# Patient Record
Sex: Male | Born: 1940 | Race: White | Hispanic: No | Marital: Married | Smoking: Former smoker
Health system: Southern US, Community
[De-identification: ages and names within clinical notes are randomized; demographics above are authoritative.]

## PROBLEM LIST (undated history)

## (undated) DIAGNOSIS — Z860100 Personal history of colon polyps, unspecified: Secondary | ICD-10-CM

## (undated) DIAGNOSIS — C4492 Squamous cell carcinoma of skin, unspecified: Secondary | ICD-10-CM

## (undated) DIAGNOSIS — I82409 Acute embolism and thrombosis of unspecified deep veins of unspecified lower extremity: Secondary | ICD-10-CM

## (undated) DIAGNOSIS — N529 Male erectile dysfunction, unspecified: Secondary | ICD-10-CM

## (undated) DIAGNOSIS — M720 Palmar fascial fibromatosis [Dupuytren]: Secondary | ICD-10-CM

## (undated) DIAGNOSIS — I10 Essential (primary) hypertension: Secondary | ICD-10-CM

## (undated) DIAGNOSIS — I219 Acute myocardial infarction, unspecified: Secondary | ICD-10-CM

## (undated) DIAGNOSIS — A809 Acute poliomyelitis, unspecified: Secondary | ICD-10-CM

## (undated) DIAGNOSIS — N361 Urethral diverticulum: Secondary | ICD-10-CM

## (undated) DIAGNOSIS — I8390 Asymptomatic varicose veins of unspecified lower extremity: Secondary | ICD-10-CM

## (undated) DIAGNOSIS — E119 Type 2 diabetes mellitus without complications: Secondary | ICD-10-CM

## (undated) DIAGNOSIS — C259 Malignant neoplasm of pancreas, unspecified: Secondary | ICD-10-CM

## (undated) DIAGNOSIS — N200 Calculus of kidney: Secondary | ICD-10-CM

## (undated) DIAGNOSIS — S7290XA Unspecified fracture of unspecified femur, initial encounter for closed fracture: Secondary | ICD-10-CM

## (undated) DIAGNOSIS — K5730 Diverticulosis of large intestine without perforation or abscess without bleeding: Secondary | ICD-10-CM

## (undated) DIAGNOSIS — I2510 Atherosclerotic heart disease of native coronary artery without angina pectoris: Secondary | ICD-10-CM

## (undated) DIAGNOSIS — K219 Gastro-esophageal reflux disease without esophagitis: Secondary | ICD-10-CM

## (undated) DIAGNOSIS — E785 Hyperlipidemia, unspecified: Secondary | ICD-10-CM

## (undated) HISTORY — DX: Acute embolism and thrombosis of unspecified deep veins of unspecified lower extremity: I82.409

## (undated) HISTORY — DX: Gastro-esophageal reflux disease without esophagitis: K21.9

## (undated) HISTORY — DX: Atherosclerotic heart disease of native coronary artery without angina pectoris: I25.10

## (undated) HISTORY — DX: Acute myocardial infarction, unspecified: I21.9

## (undated) HISTORY — DX: Squamous cell carcinoma of skin, unspecified: C44.92

## (undated) HISTORY — DX: Diverticulosis of large intestine without perforation or abscess without bleeding: K57.30

## (undated) HISTORY — DX: Type 2 diabetes mellitus without complications: E11.9

## (undated) HISTORY — DX: Essential (primary) hypertension: I10

## (undated) HISTORY — DX: Urethral diverticulum: N36.1

## (undated) HISTORY — DX: Calculus of kidney: N20.0

## (undated) HISTORY — DX: Palmar fascial fibromatosis (dupuytren): M72.0

## (undated) HISTORY — DX: Male erectile dysfunction, unspecified: N52.9

## (undated) HISTORY — DX: Asymptomatic varicose veins of unspecified lower extremity: I83.90

## (undated) HISTORY — DX: Hyperlipidemia, unspecified: E78.5

## (undated) HISTORY — DX: Unspecified fracture of unspecified femur, initial encounter for closed fracture: S72.90XA

## (undated) HISTORY — DX: Malignant neoplasm of pancreas, unspecified: C25.9

## (undated) HISTORY — DX: Personal history of colonic polyps: Z86.010

## (undated) HISTORY — DX: Personal history of colon polyps, unspecified: Z86.0100

## (undated) HISTORY — DX: Acute poliomyelitis, unspecified: A80.9

---
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
## 1992-10-08 HISTORY — PX: APPENDECTOMY: SHX54

## 1999-07-13 ENCOUNTER — Emergency Department (HOSPITAL_COMMUNITY): Admission: EM | Admit: 1999-07-13 | Discharge: 1999-07-13 | Payer: Self-pay | Admitting: Emergency Medicine

## 1999-07-13 ENCOUNTER — Encounter: Payer: Self-pay | Admitting: Emergency Medicine

## 2001-01-29 ENCOUNTER — Encounter: Payer: Self-pay | Admitting: Family Medicine

## 2003-02-06 ENCOUNTER — Encounter: Payer: Self-pay | Admitting: Family Medicine

## 2003-02-06 LAB — CONVERTED CEMR LAB: PSA: 1 ng/mL

## 2003-02-18 ENCOUNTER — Encounter: Payer: Self-pay | Admitting: Family Medicine

## 2003-02-18 LAB — CONVERTED CEMR LAB
Blood Glucose, Fasting: 83 mg/dL
PSA: 1 ng/mL

## 2004-08-10 ENCOUNTER — Ambulatory Visit: Payer: Self-pay | Admitting: Family Medicine

## 2004-12-05 ENCOUNTER — Ambulatory Visit: Payer: Self-pay | Admitting: Family Medicine

## 2004-12-06 ENCOUNTER — Encounter: Payer: Self-pay | Admitting: Family Medicine

## 2004-12-06 LAB — CONVERTED CEMR LAB: PSA: 0.88 ng/mL

## 2004-12-26 ENCOUNTER — Ambulatory Visit: Payer: Self-pay | Admitting: Family Medicine

## 2004-12-26 LAB — CONVERTED CEMR LAB
Blood Glucose, Fasting: 112 mg/dL
PSA: 0.88 ng/mL
TSH: 2.64 microintl units/mL

## 2004-12-28 ENCOUNTER — Ambulatory Visit: Payer: Self-pay | Admitting: Family Medicine

## 2005-01-02 ENCOUNTER — Ambulatory Visit: Payer: Self-pay | Admitting: Family Medicine

## 2005-01-09 ENCOUNTER — Ambulatory Visit: Payer: Self-pay

## 2005-01-15 ENCOUNTER — Ambulatory Visit: Payer: Self-pay | Admitting: Family Medicine

## 2005-03-14 ENCOUNTER — Ambulatory Visit (HOSPITAL_BASED_OUTPATIENT_CLINIC_OR_DEPARTMENT_OTHER): Admission: RE | Admit: 2005-03-14 | Discharge: 2005-03-14 | Payer: Self-pay | Admitting: Surgery

## 2005-03-14 ENCOUNTER — Ambulatory Visit (HOSPITAL_COMMUNITY): Admission: RE | Admit: 2005-03-14 | Discharge: 2005-03-14 | Payer: Self-pay | Admitting: Surgery

## 2005-03-14 HISTORY — PX: UMBILICAL HERNIA REPAIR: SHX196

## 2005-07-02 ENCOUNTER — Ambulatory Visit: Payer: Self-pay | Admitting: Family Medicine

## 2005-08-06 ENCOUNTER — Ambulatory Visit: Payer: Self-pay | Admitting: Family Medicine

## 2005-09-07 ENCOUNTER — Ambulatory Visit: Payer: Self-pay | Admitting: Family Medicine

## 2005-09-10 ENCOUNTER — Ambulatory Visit: Payer: Self-pay | Admitting: Family Medicine

## 2005-10-29 ENCOUNTER — Ambulatory Visit: Payer: Self-pay | Admitting: Family Medicine

## 2006-04-23 ENCOUNTER — Ambulatory Visit: Payer: Self-pay | Admitting: Family Medicine

## 2006-04-23 LAB — CONVERTED CEMR LAB: Blood Glucose, Fasting: 115 mg/dL

## 2006-04-26 ENCOUNTER — Ambulatory Visit: Payer: Self-pay | Admitting: Family Medicine

## 2006-06-03 ENCOUNTER — Ambulatory Visit: Payer: Self-pay | Admitting: Family Medicine

## 2006-06-05 ENCOUNTER — Ambulatory Visit: Payer: Self-pay | Admitting: Family Medicine

## 2006-08-14 ENCOUNTER — Ambulatory Visit: Payer: Self-pay | Admitting: Family Medicine

## 2006-09-03 ENCOUNTER — Ambulatory Visit: Payer: Self-pay | Admitting: Family Medicine

## 2006-09-05 ENCOUNTER — Ambulatory Visit: Payer: Self-pay | Admitting: Family Medicine

## 2007-01-09 ENCOUNTER — Ambulatory Visit: Payer: Self-pay | Admitting: Family Medicine

## 2007-03-19 ENCOUNTER — Telehealth (INDEPENDENT_AMBULATORY_CARE_PROVIDER_SITE_OTHER): Payer: Self-pay | Admitting: *Deleted

## 2007-04-02 ENCOUNTER — Ambulatory Visit: Payer: Self-pay | Admitting: Family Medicine

## 2007-04-02 DIAGNOSIS — T887XXA Unspecified adverse effect of drug or medicament, initial encounter: Secondary | ICD-10-CM | POA: Insufficient documentation

## 2007-04-03 ENCOUNTER — Telehealth (INDEPENDENT_AMBULATORY_CARE_PROVIDER_SITE_OTHER): Payer: Self-pay | Admitting: *Deleted

## 2007-04-09 ENCOUNTER — Telehealth (INDEPENDENT_AMBULATORY_CARE_PROVIDER_SITE_OTHER): Payer: Self-pay | Admitting: *Deleted

## 2007-04-10 ENCOUNTER — Ambulatory Visit: Payer: Self-pay | Admitting: Family Medicine

## 2007-04-15 ENCOUNTER — Encounter: Payer: Self-pay | Admitting: Family Medicine

## 2007-05-21 ENCOUNTER — Encounter: Payer: Self-pay | Admitting: Family Medicine

## 2007-05-21 DIAGNOSIS — Z860100 Personal history of colon polyps, unspecified: Secondary | ICD-10-CM | POA: Insufficient documentation

## 2007-05-21 DIAGNOSIS — E785 Hyperlipidemia, unspecified: Secondary | ICD-10-CM | POA: Insufficient documentation

## 2007-05-21 DIAGNOSIS — M720 Palmar fascial fibromatosis [Dupuytren]: Secondary | ICD-10-CM | POA: Insufficient documentation

## 2007-05-21 DIAGNOSIS — K5730 Diverticulosis of large intestine without perforation or abscess without bleeding: Secondary | ICD-10-CM | POA: Insufficient documentation

## 2007-05-21 DIAGNOSIS — I10 Essential (primary) hypertension: Secondary | ICD-10-CM

## 2007-05-21 DIAGNOSIS — K219 Gastro-esophageal reflux disease without esophagitis: Secondary | ICD-10-CM

## 2007-05-21 DIAGNOSIS — F528 Other sexual dysfunction not due to a substance or known physiological condition: Secondary | ICD-10-CM

## 2007-05-21 DIAGNOSIS — J301 Allergic rhinitis due to pollen: Secondary | ICD-10-CM

## 2007-05-27 ENCOUNTER — Ambulatory Visit: Payer: Self-pay | Admitting: Unknown Physician Specialty

## 2007-05-27 ENCOUNTER — Ambulatory Visit: Payer: Self-pay | Admitting: Family Medicine

## 2007-05-27 ENCOUNTER — Other Ambulatory Visit: Payer: Self-pay

## 2007-05-29 ENCOUNTER — Ambulatory Visit: Payer: Self-pay | Admitting: Family Medicine

## 2007-06-02 ENCOUNTER — Ambulatory Visit: Payer: Self-pay | Admitting: Unknown Physician Specialty

## 2007-06-18 ENCOUNTER — Encounter: Payer: Self-pay | Admitting: Family Medicine

## 2007-06-18 HISTORY — PX: MOHS SURGERY: SUR867

## 2007-06-30 ENCOUNTER — Telehealth: Payer: Self-pay | Admitting: Family Medicine

## 2007-06-30 ENCOUNTER — Ambulatory Visit: Payer: Self-pay | Admitting: Internal Medicine

## 2007-06-30 ENCOUNTER — Ambulatory Visit: Payer: Self-pay | Admitting: Family Medicine

## 2007-06-30 DIAGNOSIS — R319 Hematuria, unspecified: Secondary | ICD-10-CM | POA: Insufficient documentation

## 2007-06-30 LAB — CONVERTED CEMR LAB
Bilirubin Urine: NEGATIVE
Calcium: 9.4 mg/dL (ref 8.4–10.5)
Casts: 0 /lpf
Chloride: 105 meq/L (ref 96–112)
GFR calc Af Amer: 65 mL/min
GFR calc non Af Amer: 54 mL/min
Mucus, UA: 0
Sodium: 141 meq/L (ref 135–145)
Specific Gravity, Urine: 1.02
Urobilinogen, UA: 0.2
Yeast, UA: 0

## 2007-07-01 ENCOUNTER — Encounter: Payer: Self-pay | Admitting: Family Medicine

## 2007-07-07 ENCOUNTER — Ambulatory Visit (HOSPITAL_COMMUNITY): Admission: RE | Admit: 2007-07-07 | Discharge: 2007-07-07 | Payer: Self-pay | Admitting: Urology

## 2007-08-18 ENCOUNTER — Ambulatory Visit: Payer: Self-pay | Admitting: Family Medicine

## 2007-10-07 ENCOUNTER — Telehealth (INDEPENDENT_AMBULATORY_CARE_PROVIDER_SITE_OTHER): Payer: Self-pay | Admitting: *Deleted

## 2007-10-08 ENCOUNTER — Ambulatory Visit: Payer: Self-pay | Admitting: Family Medicine

## 2007-10-09 DIAGNOSIS — I82409 Acute embolism and thrombosis of unspecified deep veins of unspecified lower extremity: Secondary | ICD-10-CM

## 2007-10-09 HISTORY — DX: Acute embolism and thrombosis of unspecified deep veins of unspecified lower extremity: I82.409

## 2007-10-13 ENCOUNTER — Ambulatory Visit: Payer: Self-pay | Admitting: Family Medicine

## 2007-10-13 ENCOUNTER — Other Ambulatory Visit: Payer: Self-pay

## 2007-10-13 ENCOUNTER — Telehealth: Payer: Self-pay | Admitting: Family Medicine

## 2007-10-13 ENCOUNTER — Inpatient Hospital Stay: Payer: Self-pay | Admitting: *Deleted

## 2007-10-13 DIAGNOSIS — M79609 Pain in unspecified limb: Secondary | ICD-10-CM | POA: Insufficient documentation

## 2007-10-14 ENCOUNTER — Encounter: Payer: Self-pay | Admitting: Family Medicine

## 2007-10-15 ENCOUNTER — Encounter: Payer: Self-pay | Admitting: Family Medicine

## 2007-10-17 ENCOUNTER — Ambulatory Visit: Payer: Self-pay | Admitting: Family Medicine

## 2007-10-17 DIAGNOSIS — I82409 Acute embolism and thrombosis of unspecified deep veins of unspecified lower extremity: Secondary | ICD-10-CM | POA: Insufficient documentation

## 2007-10-17 LAB — CONVERTED CEMR LAB: INR: 1.6

## 2007-10-20 ENCOUNTER — Ambulatory Visit: Payer: Self-pay | Admitting: Family Medicine

## 2007-10-20 LAB — CONVERTED CEMR LAB: INR: 3.1

## 2007-10-22 ENCOUNTER — Ambulatory Visit: Payer: Self-pay | Admitting: Family Medicine

## 2007-10-24 ENCOUNTER — Ambulatory Visit: Payer: Self-pay | Admitting: Family Medicine

## 2007-10-24 LAB — CONVERTED CEMR LAB
INR: 3.4
Prothrombin Time: 22.3 s

## 2007-10-31 ENCOUNTER — Ambulatory Visit: Payer: Self-pay | Admitting: Family Medicine

## 2007-10-31 LAB — CONVERTED CEMR LAB: INR: 3.5

## 2007-11-10 ENCOUNTER — Ambulatory Visit: Payer: Self-pay | Admitting: Family Medicine

## 2007-11-12 ENCOUNTER — Ambulatory Visit: Payer: Self-pay | Admitting: Family Medicine

## 2007-11-28 ENCOUNTER — Ambulatory Visit: Payer: Self-pay | Admitting: Family Medicine

## 2007-11-28 LAB — CONVERTED CEMR LAB: INR: 1.7

## 2007-12-12 ENCOUNTER — Ambulatory Visit: Payer: Self-pay | Admitting: Family Medicine

## 2007-12-12 LAB — CONVERTED CEMR LAB: INR: 1.8

## 2007-12-26 ENCOUNTER — Telehealth: Payer: Self-pay | Admitting: Family Medicine

## 2007-12-26 ENCOUNTER — Ambulatory Visit: Payer: Self-pay | Admitting: Family Medicine

## 2007-12-26 LAB — CONVERTED CEMR LAB
INR: 3
Prothrombin Time: 20.8 s

## 2008-01-01 ENCOUNTER — Encounter: Payer: Self-pay | Admitting: Family Medicine

## 2008-01-01 ENCOUNTER — Telehealth: Payer: Self-pay | Admitting: Family Medicine

## 2008-01-05 ENCOUNTER — Ambulatory Visit: Payer: Self-pay | Admitting: Family Medicine

## 2008-01-07 ENCOUNTER — Encounter: Payer: Self-pay | Admitting: Family Medicine

## 2008-02-05 ENCOUNTER — Telehealth: Payer: Self-pay | Admitting: Family Medicine

## 2008-02-05 ENCOUNTER — Ambulatory Visit: Payer: Self-pay | Admitting: Family Medicine

## 2008-02-05 LAB — CONVERTED CEMR LAB: INR: 2.5

## 2008-03-02 ENCOUNTER — Ambulatory Visit: Payer: Self-pay | Admitting: Family Medicine

## 2008-03-02 DIAGNOSIS — S139XXA Sprain of joints and ligaments of unspecified parts of neck, initial encounter: Secondary | ICD-10-CM

## 2008-03-02 DIAGNOSIS — I83893 Varicose veins of bilateral lower extremities with other complications: Secondary | ICD-10-CM | POA: Insufficient documentation

## 2008-03-30 ENCOUNTER — Ambulatory Visit: Payer: Self-pay | Admitting: Family Medicine

## 2008-03-30 LAB — CONVERTED CEMR LAB: Prothrombin Time: 18.4 s

## 2008-04-26 ENCOUNTER — Ambulatory Visit: Payer: Self-pay | Admitting: Family Medicine

## 2008-04-26 LAB — CONVERTED CEMR LAB: Prothrombin Time: 18.7 s

## 2008-05-10 ENCOUNTER — Encounter: Payer: Self-pay | Admitting: Family Medicine

## 2008-05-24 ENCOUNTER — Ambulatory Visit: Payer: Self-pay | Admitting: Family Medicine

## 2008-05-28 ENCOUNTER — Ambulatory Visit: Payer: Self-pay | Admitting: Family Medicine

## 2008-05-28 DIAGNOSIS — N361 Urethral diverticulum: Secondary | ICD-10-CM | POA: Insufficient documentation

## 2008-05-28 LAB — CONVERTED CEMR LAB
INR: 2.3
Prothrombin Time: 18.4 s

## 2008-05-29 LAB — CONVERTED CEMR LAB
Albumin: 3.6 g/dL (ref 3.5–5.2)
BUN: 16 mg/dL (ref 6–23)
Basophils Relative: 0.7 % (ref 0.0–3.0)
Creatinine, Ser: 0.9 mg/dL (ref 0.4–1.5)
Creatinine,U: 150.2 mg/dL
Eosinophils Absolute: 0.2 10*3/uL (ref 0.0–0.7)
Eosinophils Relative: 3.2 % (ref 0.0–5.0)
GFR calc Af Amer: 109 mL/min
GFR calc non Af Amer: 90 mL/min
HCT: 45.2 % (ref 39.0–52.0)
HDL: 34.3 mg/dL — ABNORMAL LOW (ref >39.0)
Hemoglobin: 15.2 g/dL (ref 13.0–17.0)
MCV: 80.3 fL (ref 78.0–100.0)
Microalb Creat Ratio: 3.3 mg/g (ref 0.0–30.0)
Monocytes Absolute: 0.5 10*3/uL (ref 0.1–1.0)
Neutro Abs: 3.1 10*3/uL (ref 1.4–7.7)
PSA: 1.05 ng/mL (ref 0.10–4.00)
RBC: 5.63 M/uL (ref 4.22–5.81)
Total Protein: 6.5 g/dL (ref 6.0–8.3)
WBC: 5.1 10*3/uL (ref 4.5–10.5)

## 2008-05-31 ENCOUNTER — Encounter: Payer: Self-pay | Admitting: Family Medicine

## 2008-05-31 ENCOUNTER — Ambulatory Visit: Payer: Self-pay | Admitting: Family Medicine

## 2008-06-02 ENCOUNTER — Ambulatory Visit: Payer: Self-pay | Admitting: Family Medicine

## 2008-06-15 ENCOUNTER — Telehealth: Payer: Self-pay | Admitting: Family Medicine

## 2008-06-16 ENCOUNTER — Encounter: Payer: Self-pay | Admitting: Family Medicine

## 2008-06-30 ENCOUNTER — Ambulatory Visit: Payer: Self-pay | Admitting: Family Medicine

## 2008-07-05 ENCOUNTER — Encounter: Payer: Self-pay | Admitting: Family Medicine

## 2008-07-07 ENCOUNTER — Ambulatory Visit: Payer: Self-pay | Admitting: Family Medicine

## 2008-07-12 ENCOUNTER — Encounter: Payer: Self-pay | Admitting: Family Medicine

## 2008-07-14 ENCOUNTER — Ambulatory Visit: Payer: Self-pay | Admitting: Unknown Physician Specialty

## 2008-07-14 ENCOUNTER — Encounter: Payer: Self-pay | Admitting: Family Medicine

## 2008-08-03 ENCOUNTER — Ambulatory Visit: Payer: Self-pay | Admitting: Family Medicine

## 2008-08-03 LAB — CONVERTED CEMR LAB
Bilirubin Urine: NEGATIVE
Glucose, Urine, Semiquant: NEGATIVE
INR: 2.3
Nitrite: NEGATIVE
Specific Gravity, Urine: 1.02
WBC Urine, dipstick: NEGATIVE
pH: 6

## 2008-08-05 ENCOUNTER — Telehealth: Payer: Self-pay | Admitting: Family Medicine

## 2008-08-12 ENCOUNTER — Ambulatory Visit: Payer: Self-pay | Admitting: Family Medicine

## 2008-08-23 ENCOUNTER — Ambulatory Visit: Payer: Self-pay | Admitting: Family Medicine

## 2008-08-23 DIAGNOSIS — E669 Obesity, unspecified: Secondary | ICD-10-CM

## 2008-08-23 LAB — CONVERTED CEMR LAB
INR: 4.6 — ABNORMAL HIGH (ref 0.8–1.0)
Prothrombin Time: 45.6 s — ABNORMAL HIGH (ref 10.9–13.3)

## 2008-08-30 ENCOUNTER — Ambulatory Visit: Payer: Self-pay | Admitting: Family Medicine

## 2008-09-15 ENCOUNTER — Ambulatory Visit: Payer: Self-pay | Admitting: Family Medicine

## 2008-09-15 LAB — CONVERTED CEMR LAB: Prothrombin Time: 19 s

## 2008-10-06 ENCOUNTER — Ambulatory Visit: Payer: Self-pay | Admitting: Family Medicine

## 2008-11-04 ENCOUNTER — Ambulatory Visit: Payer: Self-pay | Admitting: Family Medicine

## 2008-11-04 LAB — CONVERTED CEMR LAB: INR: 2

## 2008-12-02 ENCOUNTER — Ambulatory Visit: Payer: Self-pay | Admitting: Family Medicine

## 2008-12-02 LAB — CONVERTED CEMR LAB: Prothrombin Time: 19.9 s

## 2008-12-15 ENCOUNTER — Ambulatory Visit: Payer: Self-pay | Admitting: Family Medicine

## 2008-12-15 DIAGNOSIS — R079 Chest pain, unspecified: Secondary | ICD-10-CM | POA: Insufficient documentation

## 2009-01-03 ENCOUNTER — Ambulatory Visit: Payer: Self-pay | Admitting: Family Medicine

## 2009-01-03 LAB — CONVERTED CEMR LAB
INR: 1.7
Prothrombin Time: 16.1 s

## 2009-01-17 ENCOUNTER — Ambulatory Visit: Payer: Self-pay | Admitting: Family Medicine

## 2009-01-17 LAB — CONVERTED CEMR LAB: Prothrombin Time: 17.1 s

## 2009-01-21 ENCOUNTER — Telehealth: Payer: Self-pay | Admitting: Family Medicine

## 2009-01-21 ENCOUNTER — Encounter: Payer: Self-pay | Admitting: Family Medicine

## 2009-01-24 ENCOUNTER — Encounter: Payer: Self-pay | Admitting: Family Medicine

## 2009-01-26 ENCOUNTER — Telehealth: Payer: Self-pay | Admitting: Family Medicine

## 2009-01-31 ENCOUNTER — Ambulatory Visit: Payer: Self-pay | Admitting: Family Medicine

## 2009-01-31 DIAGNOSIS — I2510 Atherosclerotic heart disease of native coronary artery without angina pectoris: Secondary | ICD-10-CM

## 2009-01-31 LAB — CONVERTED CEMR LAB: INR: 2.4

## 2009-02-01 ENCOUNTER — Telehealth: Payer: Self-pay | Admitting: Family Medicine

## 2009-02-02 ENCOUNTER — Ambulatory Visit: Payer: Self-pay | Admitting: Cardiology

## 2009-02-04 ENCOUNTER — Telehealth: Payer: Self-pay | Admitting: Family Medicine

## 2009-02-09 ENCOUNTER — Inpatient Hospital Stay (HOSPITAL_BASED_OUTPATIENT_CLINIC_OR_DEPARTMENT_OTHER): Admission: RE | Admit: 2009-02-09 | Discharge: 2009-02-09 | Payer: Self-pay | Admitting: Cardiology

## 2009-02-09 ENCOUNTER — Ambulatory Visit: Payer: Self-pay | Admitting: Cardiology

## 2009-02-21 ENCOUNTER — Ambulatory Visit: Payer: Self-pay | Admitting: Cardiology

## 2009-02-23 ENCOUNTER — Ambulatory Visit: Payer: Self-pay | Admitting: Family Medicine

## 2009-03-23 ENCOUNTER — Encounter (INDEPENDENT_AMBULATORY_CARE_PROVIDER_SITE_OTHER): Payer: Self-pay | Admitting: *Deleted

## 2009-03-25 ENCOUNTER — Telehealth: Payer: Self-pay | Admitting: Cardiology

## 2009-04-21 ENCOUNTER — Encounter (INDEPENDENT_AMBULATORY_CARE_PROVIDER_SITE_OTHER): Payer: Self-pay | Admitting: *Deleted

## 2009-05-05 ENCOUNTER — Encounter (INDEPENDENT_AMBULATORY_CARE_PROVIDER_SITE_OTHER): Payer: Self-pay | Admitting: *Deleted

## 2009-05-25 ENCOUNTER — Ambulatory Visit: Payer: Self-pay | Admitting: Cardiovascular Disease

## 2009-05-25 ENCOUNTER — Encounter: Payer: Self-pay | Admitting: Cardiology

## 2009-05-26 ENCOUNTER — Ambulatory Visit: Payer: Self-pay | Admitting: Cardiology

## 2009-05-26 LAB — CONVERTED CEMR LAB
ALT: 18 units/L (ref 0–53)
Bilirubin, Direct: 0.2 mg/dL (ref 0.0–0.3)
Cholesterol: 95 mg/dL (ref 0–200)
Indirect Bilirubin: 0.5 mg/dL (ref 0.0–0.9)
LDL Cholesterol: 41 mg/dL (ref 0–99)
Total CHOL/HDL Ratio: 2.3
Total Protein: 6.6 g/dL (ref 6.0–8.3)
VLDL: 13 mg/dL (ref 0–40)

## 2009-06-02 ENCOUNTER — Ambulatory Visit: Payer: Self-pay | Admitting: Family Medicine

## 2009-06-02 LAB — CONVERTED CEMR LAB
Alkaline Phosphatase: 59 units/L (ref 39–117)
BUN: 13 mg/dL (ref 6–23)
Basophils Absolute: 0 10*3/uL (ref 0.0–0.1)
Bilirubin, Direct: 0.2 mg/dL (ref 0.0–0.3)
CO2: 31 meq/L (ref 19–32)
Calcium: 8.7 mg/dL (ref 8.4–10.5)
Chloride: 108 meq/L (ref 96–112)
Cholesterol: 116 mg/dL (ref 0–200)
Creatinine, Ser: 0.9 mg/dL (ref 0.4–1.5)
Creatinine,U: 171.3 mg/dL
Eosinophils Absolute: 0.2 10*3/uL (ref 0.0–0.7)
HDL: 36.9 mg/dL — ABNORMAL LOW (ref >39.00)
LDL Cholesterol: 63 mg/dL (ref 0–99)
Lymphocytes Relative: 30.8 % (ref 12.0–46.0)
MCHC: 33.1 g/dL (ref 30.0–36.0)
MCV: 81.1 fL (ref 78.0–100.0)
Microalb Creat Ratio: 3.5 mg/g (ref 0.0–30.0)
Microalb, Ur: 0.6 mg/dL (ref 0.0–1.9)
Monocytes Absolute: 0.4 10*3/uL (ref 0.1–1.0)
Neutrophils Relative %: 58.2 % (ref 43.0–77.0)
PSA: 1.24 ng/mL (ref 0.10–4.00)
Platelets: 133 10*3/uL — ABNORMAL LOW (ref 150.0–400.0)
Total Bilirubin: 1.3 mg/dL — ABNORMAL HIGH (ref 0.3–1.2)
Total CHOL/HDL Ratio: 3
Total Protein: 6.6 g/dL (ref 6.0–8.3)
Triglycerides: 81 mg/dL (ref 0.0–149.0)

## 2009-06-04 LAB — CONVERTED CEMR LAB: Vit D, 25-Hydroxy: 28 ng/mL — ABNORMAL LOW (ref 30–89)

## 2009-06-07 ENCOUNTER — Ambulatory Visit: Payer: Self-pay | Admitting: Family Medicine

## 2009-06-21 ENCOUNTER — Telehealth: Payer: Self-pay | Admitting: Family Medicine

## 2009-06-23 ENCOUNTER — Encounter: Payer: Self-pay | Admitting: Family Medicine

## 2009-07-11 ENCOUNTER — Telehealth: Payer: Self-pay | Admitting: Family Medicine

## 2009-07-20 ENCOUNTER — Encounter (INDEPENDENT_AMBULATORY_CARE_PROVIDER_SITE_OTHER): Payer: Self-pay | Admitting: *Deleted

## 2009-08-03 ENCOUNTER — Telehealth: Payer: Self-pay | Admitting: Family Medicine

## 2009-08-15 ENCOUNTER — Encounter: Payer: Self-pay | Admitting: Family Medicine

## 2009-08-22 ENCOUNTER — Encounter: Payer: Self-pay | Admitting: Family Medicine

## 2009-08-26 ENCOUNTER — Encounter: Payer: Self-pay | Admitting: Family Medicine

## 2009-08-29 ENCOUNTER — Encounter: Payer: Self-pay | Admitting: Family Medicine

## 2009-08-31 ENCOUNTER — Encounter: Payer: Self-pay | Admitting: Family Medicine

## 2009-09-07 ENCOUNTER — Ambulatory Visit: Payer: Self-pay | Admitting: Family Medicine

## 2009-09-07 DIAGNOSIS — E559 Vitamin D deficiency, unspecified: Secondary | ICD-10-CM | POA: Insufficient documentation

## 2009-09-08 ENCOUNTER — Ambulatory Visit: Payer: Self-pay | Admitting: Family Medicine

## 2009-11-08 ENCOUNTER — Ambulatory Visit: Payer: Self-pay | Admitting: Family Medicine

## 2009-12-26 ENCOUNTER — Ambulatory Visit: Payer: Self-pay | Admitting: Internal Medicine

## 2009-12-26 ENCOUNTER — Encounter: Payer: Self-pay | Admitting: Cardiovascular Disease

## 2009-12-27 LAB — CONVERTED CEMR LAB
AST: 16 units/L (ref 0–37)
BUN: 14 mg/dL (ref 6–23)
Calcium: 8.7 mg/dL (ref 8.4–10.5)
Chloride: 108 meq/L (ref 96–112)
Creatinine, Ser: 0.87 mg/dL (ref 0.40–1.50)
HDL: 41 mg/dL (ref >39)
Total Bilirubin: 0.9 mg/dL (ref 0.3–1.2)
Total CHOL/HDL Ratio: 2.5
VLDL: 15 mg/dL (ref 0–40)

## 2010-05-15 ENCOUNTER — Encounter (INDEPENDENT_AMBULATORY_CARE_PROVIDER_SITE_OTHER): Payer: Self-pay | Admitting: *Deleted

## 2010-07-31 ENCOUNTER — Telehealth: Payer: Self-pay | Admitting: Family Medicine

## 2010-08-21 ENCOUNTER — Ambulatory Visit: Payer: Self-pay | Admitting: Family Medicine

## 2010-08-21 LAB — CONVERTED CEMR LAB
ALT: 20 units/L (ref 0–53)
AST: 19 units/L (ref 0–37)
BUN: 17 mg/dL (ref 6–23)
Bilirubin, Direct: 0.2 mg/dL (ref 0.0–0.3)
Chloride: 104 meq/L (ref 96–112)
Cholesterol: 120 mg/dL (ref 0–200)
LDL Cholesterol: 61 mg/dL (ref 0–99)
Potassium: 5.3 meq/L — ABNORMAL HIGH (ref 3.5–5.1)
Total Bilirubin: 1.1 mg/dL (ref 0.3–1.2)
Total CHOL/HDL Ratio: 3

## 2010-08-22 ENCOUNTER — Ambulatory Visit: Payer: Self-pay | Admitting: Family Medicine

## 2010-08-22 DIAGNOSIS — E875 Hyperkalemia: Secondary | ICD-10-CM

## 2010-08-24 ENCOUNTER — Telehealth: Payer: Self-pay | Admitting: Family Medicine

## 2010-08-25 ENCOUNTER — Ambulatory Visit: Payer: Self-pay | Admitting: Family Medicine

## 2010-08-25 DIAGNOSIS — E1165 Type 2 diabetes mellitus with hyperglycemia: Secondary | ICD-10-CM

## 2010-09-11 ENCOUNTER — Telehealth: Payer: Self-pay | Admitting: Cardiology

## 2010-09-12 ENCOUNTER — Ambulatory Visit: Payer: Self-pay | Admitting: Cardiology

## 2010-09-12 DIAGNOSIS — R011 Cardiac murmur, unspecified: Secondary | ICD-10-CM

## 2010-10-06 ENCOUNTER — Encounter: Payer: Self-pay | Admitting: Family Medicine

## 2010-10-10 ENCOUNTER — Ambulatory Visit: Admission: RE | Admit: 2010-10-10 | Discharge: 2010-10-10 | Payer: Self-pay | Source: Home / Self Care

## 2010-10-10 ENCOUNTER — Encounter: Payer: Self-pay | Admitting: Cardiology

## 2010-10-23 ENCOUNTER — Encounter: Payer: Self-pay | Admitting: Family Medicine

## 2010-10-23 ENCOUNTER — Ambulatory Visit: Payer: Self-pay | Admitting: Family Medicine

## 2010-10-23 ENCOUNTER — Ambulatory Visit
Admission: RE | Admit: 2010-10-23 | Discharge: 2010-10-23 | Payer: Self-pay | Source: Home / Self Care | Attending: Family Medicine | Admitting: Family Medicine

## 2010-10-30 ENCOUNTER — Telehealth: Payer: Self-pay | Admitting: Family Medicine

## 2010-10-31 ENCOUNTER — Encounter: Payer: Self-pay | Admitting: Family Medicine

## 2010-10-31 ENCOUNTER — Encounter (INDEPENDENT_AMBULATORY_CARE_PROVIDER_SITE_OTHER): Payer: Self-pay | Admitting: *Deleted

## 2010-10-31 ENCOUNTER — Ambulatory Visit: Payer: Self-pay | Admitting: Family Medicine

## 2010-11-01 ENCOUNTER — Encounter: Payer: Self-pay | Admitting: Family Medicine

## 2010-11-05 LAB — CONVERTED CEMR LAB
AST: 19 units/L (ref 0–37)
Albumin: 3.9 g/dL (ref 3.5–5.2)
Alkaline Phosphatase: 54 units/L (ref 39–117)
BUN: 21 mg/dL (ref 6–23)
CO2: 23 meq/L (ref 19–32)
Cholesterol: 129 mg/dL (ref 0–200)
Cholesterol: 162 mg/dL (ref 0–200)
Creatinine, Ser: 1.11 mg/dL (ref 0.40–1.50)
Creatinine,U: 165.2 mg/dL
Glucose, Bld: 140 mg/dL — ABNORMAL HIGH (ref 70–99)
HCT: 48.5 % (ref 39.0–52.0)
HDL: 32 mg/dL — ABNORMAL LOW (ref >39)
HDL: 38.6 mg/dL — ABNORMAL LOW (ref >39.0)
Hemoglobin: 15.8 g/dL (ref 13.0–17.0)
LDL Cholesterol: 75 mg/dL (ref 0–99)
MCHC: 32.6 g/dL (ref 30.0–36.0)
MCV: 77.6 fL — ABNORMAL LOW (ref 78.0–100.0)
Platelets: 269 10*3/uL (ref 150–400)
RDW: 14.5 % (ref 11.5–15.5)
TSH: 2.54 microintl units/mL (ref 0.35–5.50)
Total Bilirubin: 0.5 mg/dL (ref 0.3–1.2)
Total Bilirubin: 1.2 mg/dL (ref 0.3–1.2)
Triglycerides: 108 mg/dL (ref <150)
Triglycerides: 84 mg/dL (ref 0–149)
VLDL: 22 mg/dL (ref 0–40)

## 2010-11-07 NOTE — Progress Notes (Signed)
Summary: Lipitor  Phone Note Call from Patient Call back at Home Phone [phone] Call back at [phone]   Caller: Self Call For: [facility] Summary of Call: Would like to know if he can change from Lipitor to the generic. Initial call taken by: Harlon Flor,  September 11, 2010 11:00 AM  Follow-up for Phone Call        What dose of lipitor (generic) should patient be on? If you want patient to take the generic lipitor. Follow-up by: Bishop Dublin, CMA,  September 11, 2010 12:05 PM     Appended Document: Lipitor Fine for him to take atorvastatin 80 mg daily (generic Lipitor)  Appended Document: Lipitor pt's wife notified of above MES

## 2010-11-07 NOTE — Progress Notes (Signed)
Summary: regarding labs   Phone Note Call from Patient Call back at Home Phone [phone] Call back at [phone]   Caller: Spouse- Harriett Sine Call For: Dr. Para March  Summary of Call: Patient has a cpx and lab appt coming up. Wife says that patient always gets his labs done at his heart dr. because he take lipitor. Wife is asking if that is something he can just have checked here so that he doesn't have to have labs done twice or will he still need to have labs done with his cardiologist as well. Please advise.  Initial call taken by: Melody Comas,  July 31, 2010 5:33 PM  Follow-up for Phone Call        I would like him to get:  PSA v76.44 A1c 790.29 Cmet/lipid 401.1  I would like them to be done before the visit.  Ask cards if they have any other labs that they want.  I don't have a preference about where/when they are drawn.  If they can all be done together, then that's fine.  I wouldn't duplicate labs.  Follow-up by: Crawford Givens MD,  July 31, 2010 5:39 PM  Additional Follow-up for Phone Call Additional follow up Details #1::        Lab appt scheduled prior to appt. Cards usually checks lipid,cmet. Patient will just have labs done here.  Additional Follow-up by: Melody Comas,  August 01, 2010 8:46 AM

## 2010-11-07 NOTE — Progress Notes (Signed)
Summary: Weight Problem  Phone Note Call from Patient   Caller: Spouse, Harriett Sine Call For: Dr. Para March Summary of Call: Patient's wife is very concerned about his weight problem.  They are both coming for their PE's on Friday.  She would like for you to be very matter-of-fact to him about going to the gym or Heart Track or something.  He is continuing to gain weight and he has had a heart attack in the past.    Initial call taken by: Delilah Shan CMA Duncan Dull),  August 24, 2010 3:32 PM  Follow-up for Phone Call        noted.  Follow-up by: Crawford Givens MD,  August 24, 2010 6:03 PM

## 2010-11-07 NOTE — Letter (Signed)
Summary: Nadara Eaton letter  [place] [facility]  [address] [place], [place] [address]   Phone: [phone]  Fax: [phone]       05/15/2010 MRN: [id number]  ESSA MALACHI [address] [place], [place]  [address]  Dear Mr. [facility] [place] and [place] announce the retirement of Arta Silence, M.D., from full-time practice at the [facility] office effective April 06, 2010 and his plans of returning part-time.  It is important to Dr. Hetty Ely and to our practice that you understand that [facility] has seven physicians in our office for your health care needs.  We will continue to offer the same exceptional care that you have today.    Dr. Hetty Ely has spoken to many of you about his plans for retirement and returning part-time in the fall.   We will continue to work with you through the transition to schedule appointments for you in the office and meet the high standards that [place] is committed to.   Again, it is with great pleasure that we share the news that Dr. Hetty Ely will return to [facility] in October of 2011 with a reduced schedule.    If you have any questions, or would like to request an appointment with one of our physicians, please call us at [phone] and press the option for Scheduling an appointment.  We take pleasure in providing you with excellent patient care and look forward to seeing you at your next office visit.  Our [facility] Physicians are:  Tillman Abide, M.D. Laurita Quint, M.D. Roxy Manns, M.D. Kerby Nora, M.D. Hannah Beat, M.D. Ruthe Mannan, M.D. We proudly welcomed Raechel Ache, M.D. and Eustaquio Boyden, M.D. to the practice in July/August 2011.  Sincerely,  [place] [facility]

## 2010-11-07 NOTE — Assessment & Plan Note (Signed)
Summary: CPX,TRANSFER FROM DR SCHALLER/CLE   Vital Signs:  Patient profile:   70 year old male Height:      67 inches Weight:      203.25 pounds BMI:     31.95 Temp:     97.8 degrees F oral Pulse rate:   80 / minute Pulse rhythm:   regular BP sitting:   106 / 60  (left arm) Cuff size:   large  Vitals Entered By: Delilah Shan CMA  Dull) (08-29-10 2:14 PM) CC: CPX - Transfer from Dr. Hetty Ely   History of Present Illness: CAD- h/o MI.  No CP, SOB.  On meds below w/o adverse effect "but I want to get off all these meds."  Overweight w/o frequent exercise.   Prostate screening- d/w patient. See plan.  PSA not elevated.   Hypertension:      Using medication without problems or lightheadedness: yes Chest pain with exertion:no Edema:not if using compression stockings Short of breath:no Other issues: no  Elevated Cholesterol: Using medications without problems:yes Muscle aches:no Other complaints:no  New dx Dm2.  see plan.  Labs d/w patient.   Allergies: 1)  ! * Latex  Past History:  Family History: Last updated: 08/29/2010 Father: ; DECEASED 19 YOA MI, ?HTN Mother: DECEASED 38 YOA ; DM, HTN, ALZHEIMERS, ?STROKE BROTHER A PROSTATE CANCER, DM SISTER A  DM SISTER A  OVERWEIGHT CV:+ CAD, FATHER MI,DECEASED  HBP: +MOTHER, ? + FATHER DM: + MOTHER, BROTHER GOUT/ARTHRITIS: PROSTATE/CANCER: + BROTHER BREAST/OVARIAN/UTERINE CANCER: COLON CANCER: DEPRESSION: NEGATIVE ETOH/DRUG ABUSE: NEGATIVE OTHER: NEGATIVE STROKE  Social History: Last updated: August 29, 2010 Marital Status: Married 1976, second time Children: 2 CHILDREN, 1 step daughter  Occupation: RETIRED Scientist, product/process development Tobacco Use - Former. -- 1990 Alcohol Use - yes - rarely enoys fishing  Past Medical History: 1. Hypertension. 2. Spontaneous right leg DVT in January 2009.  Off coumadin after tx 3. Nephrolithiasis. 4. Hyperlipidemia. 5. Allergic rhinitis. 6. Last  echocardiogram done in [place] in April 2010 after his MI     showed EF of 50-55%, mild aortic insufficiency, and RV systolic     pressure of 34 mmHg. 7. Coronary artery disease.  The patient had an MI in [place].  He     was then transferred to [facility], this is in April 2010.  Troponin     was greater than 60.  EKG showed no posterior infarct.  The patient     did not have a heart catheterization.  He did have a Myoview     showing a large lateral defect from a based apex, that was mainly     fixed with some peri-infarct ischemia.  EF was 42%.  To define the     patient's anatomy and to see if there were any options for     revascularization by bypass surgery or PCI, if his territory was     found to be viable, we did do a left heart catheterization in May     2010, that showed EF of 55%, left ventricular end-diastolic     pressure of 21 mmHg, and on coronary angiography, there was     separate ostia to the LAD and the circumflex.  There was a 30-40%     ostial LAD stenosis and 40% first diagonal stenosis.  The first     obtuse marginal was totally occluded.  There was a 75% stenosis in     the AV circumflex after the first obtuse marginal.  There was a 70%     ostial stenosis in the second obtuse marginal.  There was an 80%     stenosis in the circumflex after the second obtuse marginal (the     circumflex was a small vessel at that point).  It was decided to     manage the patient medically. 8. URETHRAL DIVERTICULUM (ICD-599.2) 9. 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 ERECTILE DYSFUNCTION (ICD-302.72) 12. HX, PERSONAL, COLONIC POLYPS (ICD-V12.72) 13. GERD (ICD-530.81) 14. DIVERTICULOSIS, COLON (ICD-562.10) 15. Diabetes mellitus, type II  Past Surgical History: Reviewed history from 02/14/2009 and no changes required. L ANKLE FRACTURE  ORIF  RDISTAL ULNA FRACTURE APPENDECTOMY 1994 COLONOSCOPY NORMAL  (HISTORY OF POLYPS) 05/07/2003): REPEAT IN 04/2008 UMBILICAL HERNIORRHAPHY (DR. MARTIN):(03/14/2005) MOHS SURGERY L EAR BASAL CELL 06/18/2007 HOSP DVT   [facility] (1/5-18/2009) COLONOSCOPY POLYPS X 3 INT HEMMS DIVERTICS (DR ELLIOTT) 07/14/2008 CATH EF 55% 75% AND TOTALLY OCCL BARANCHES OFF L CIRCUMFLEX TX MEDICALLY  (02/09/2009)  Family History: Father: ; DECEASED 29 YOA MI, ?HTN Mother: DECEASED 62 YOA ; DM, HTN, ALZHEIMERS, ?STROKE BROTHER A PROSTATE CANCER, DM SISTER A  DM SISTER A  OVERWEIGHT CV:+ CAD, FATHER MI,DECEASED  HBP: +MOTHER, ? + FATHER DM: + MOTHER, BROTHER GOUT/ARTHRITIS: PROSTATE/CANCER: + BROTHER BREAST/OVARIAN/UTERINE CANCER: COLON CANCER: DEPRESSION: NEGATIVE ETOH/DRUG ABUSE: NEGATIVE OTHER: NEGATIVE STROKE  Social History: Marital Status: Married 1976, second time Children: 2 CHILDREN, 1 step daughter  Occupation: RETIRED Scientist, product/process development Tobacco Use - Former. -- 1990 Alcohol Use - yes - rarely enoys fishing  Review of Systems       See HPI.  Otherwise negative.    Physical Exam  General:  GEN: nad, alert and oriented HEENT: mucous membranes moist NECK: supple w/o LA CV: regular rate and rhythm PULM: ctab, no inc wob ABD: soft, +bs EXT: no edema SKIN: no acute rash  Rectal stool heme neg and prostate w/o nodularity   Impression & Recommendations:  Problem # 1:  CORONARY ARTERY DISEASE (ICD-414.00) >25 min spent with patient, at least half of which was spent on counseling re:dx.  I explained in great detail to him that he was high risk (MI, DM2) and needed risk factor reduction.  He needs to stay on ACE/BB/statin.  He needs follow up with cards.  this was set up today.   His updated medication list for this problem includes:    Carvedilol 6.25 Mg Tabs (Carvedilol) .Marland Kitchen... 1 twice a day by mouth    Aspirin 81 Mg Tbec (Aspirin) .Marland Kitchen... Take one tablet by mouth daily    Lisinopril 10 Mg Tabs (Lisinopril) .Marland Kitchen... 1 daily by  mouth  Orders: Cardiology Referral (Cardiology)  Problem # 2:  SCREENING FOR MALIGNANT NEOPLASM, PROSTATE (ICD-V76.44) Consider recheck in 1 year.   Problem # 3:  HYPERTENSION (ICD-401.9) D/w patient [id number], exercise and diet.  No change in meds.  His updated medication list for this problem includes:    Carvedilol 6.25 Mg Tabs (Carvedilol) .Marland Kitchen... 1 twice a day by mouth    Lisinopril 10 Mg Tabs (Lisinopril) .Marland Kitchen... 1 daily by mouth  Problem # 4:  HYPERLIPIDEMIA (ICD-272.4) D/w patient [id number], exercise and diet.  No change in meds.  His updated medication list for this problem includes:    Lipitor 80 Mg Tabs (Atorvastatin calcium) .Marland Kitchen... 1 at bedtime by mouth  Problem # 5:  DM (ICD-250.00) d/w patient.  he needs referral to DM2 teaching and follow up A1c.  His updated medication list for this problem includes:    Aspirin 81 Mg Tbec (Aspirin) .Marland Kitchen... Take one tablet by mouth daily    Lisinopril 10 Mg Tabs (Lisinopril) .Marland Kitchen... 1 daily by mouth  Orders: Nutrition Referral (Nutrition)  Complete Medication List: 1)  Allegra 180 Mg Tabs (Fexofenadine hcl) .Marland Kitchen.. 1 daily  as needed 2)  Ranitidine Hcl 150 Mg Caps (Ranitidine hcl) .... One tab by mouth in evening. 3)  Lipitor 80 Mg Tabs (Atorvastatin calcium) .Marland Kitchen.. 1 at bedtime by mouth 4)  Carvedilol 6.25 Mg Tabs (Carvedilol) .Marland Kitchen.. 1 twice a day by mouth 5)  Aspirin 81 Mg Tbec (Aspirin) .... Take one tablet by mouth daily 6)  Lisinopril 10 Mg Tabs (Lisinopril) .Marland Kitchen.. 1 daily by mouth 7)  Nexium 40 Mg Cpdr (Esomeprazole magnesium) .Marland Kitchen.. 1 daily by mouth 8)  Levitra 20 Mg Tabs (Vardenafil hcl) .... One tab by mouth one hour prior. 9)  Vitamin D 1000 Unit Tabs (Cholecalciferol) .... One tab by mouth once daily 10)  Vitamin C 500 Mg Tabs (Ascorbic acid) .... Take 1 tablet by mouth once a day  Other Orders: Flu Vaccine 26yrs + ([address]) Administration Flu vaccine - MCR (U0454) Pneumococcal Vaccine (09811) Admin 1st Vaccine (91478) Admin of Any  Addtl Vaccine (29562)  Patient Instructions: 1)  See Shirlee Limerick about your referral before your leave today.   2)  Check with your insurance to see if they will cover the shingles shot.  3)  Come back for an A1c if 3-4 months with an OV a few days later.  OV.  250.00 4)  Take care.  Work on increasing your walking and exericse.  Use the treadmill at home.    Orders Added: 1)  Est. Patient Level IV [13086] 2)  Flu Vaccine 18yrs + [90658] 3)  Administration Flu vaccine - MCR [G0008] 4)  Pneumococcal Vaccine [90732] 5)  Admin 1st Vaccine [90471] 6)  Admin of Any Addtl Vaccine [90472] 7)  Cardiology Referral [Cardiology] 8)  Nutrition Referral [Nutrition]   Immunizations Administered:  Pneumonia Vaccine:    Vaccine Type: Pneumovax (Medicare)    Site: right deltoid    Mfr: Merck    Dose: 0.5 ml    Route: IM    Given by: Delilah Shan CMA (AAMA)    Exp. Date: 02/02/2012    Lot #: [id number]    VIS given: 05/05/96 version given August 25, 2010.   Immunizations Administered:  Pneumonia Vaccine:    Vaccine Type: Pneumovax (Medicare)    Site: right deltoid    Mfr: Merck    Dose: 0.5 ml    Route: IM    Given by: Delilah Shan CMA (AAMA)    Exp. Date: 02/02/2012    Lot #: [id number]    VIS given: 05/05/96 version given August 25, 2010.  Current Allergies (reviewed today): ! * LATEX Flu Vaccine Consent Questions     Do you have a history of severe allergic reactions to this vaccine? no    Any prior history of allergic reactions to egg and/or gelatin? no    Do you have a sensitivity to the preservative Thimersol? no    Do you have a past history of Guillan-Barre Syndrome? no    Do you currently have an acute febrile illness? no    Have you ever had a severe reaction to latex? no    Vaccine information given and explained to patient? yes    Are you currently pregnant? no    Lot  Number:[id number]   Exp Date:04/06/2010   Site Given  Left Deltoid IMlbmedflu Lugene Fuquay CMA  (AAMA)  August 25, 2010 3:27 PM

## 2010-11-07 NOTE — Assessment & Plan Note (Signed)
Summary: ROV/AMD   Visit Type:  Follow-up Primary Brownie Nehme:  Dr. Para March  CC:  "doing well".  History of Present Illness: 70 yo with history of CAD s/p MI and spontaneous DVT on coumadin presents for followup.  He has been doing well since I last saw him.  He is talking Lipitor 80 mg daily with mild myalgias.  He has had no significant chest pain or exertional dyspnea.  He has been diagnosed with diabetes but has not been started on meds.  He is trying to lose weight to avoid meds.  Mr Boening exercises at the gym several times a week.  He will walk 1.5 miles on a treadmill and then do weights.    Labs (8/10): LDL 41, HDL 41, LFTs normal Labs (3/01): K 4.4, creatinine 0.87 Labs (11/11): LDL 61, HDL 41, K 4.7, creatinine 1.0, LFTs normal  ECG: NSR, normal  Current Medications (verified): 1)  Allegra 180 Mg  Tabs (Fexofenadine Hcl) .Marland Kitchen.. 1 Daily  As Needed 2)  Ranitidine Hcl 150 Mg Caps (Ranitidine Hcl) .... One Tab By Mouth in Evening. 3)  Lipitor 80 Mg Tabs (Atorvastatin Calcium) .Marland Kitchen.. 1 At Bedtime By Mouth 4)  Carvedilol 6.25 Mg Tabs (Carvedilol) .Marland Kitchen.. 1 Twice A Day By Mouth 5)  Aspirin 81 Mg Tbec (Aspirin) .... Take One Tablet By Mouth Daily 6)  Lisinopril 10 Mg Tabs (Lisinopril) .Marland Kitchen.. 1 Daily By Mouth 7)  Nexium 40 Mg Cpdr (Esomeprazole Magnesium) .Marland Kitchen.. 1 Daily By Mouth 8)  Levitra 20 Mg Tabs (Vardenafil Hcl) .... One Tab By Mouth One Hour Prior. 9)  Vitamin D 1000 Unit Tabs (Cholecalciferol) .... One Tab By Mouth Once Daily 10)  Vitamin C 500 Mg  Tabs (Ascorbic Acid) .... Take 1 Tablet By Mouth Once A Day  Allergies (verified): 1)  ! * Latex  Past History:  Past Surgical History: Last updated: 02/14/2009 L ANKLE FRACTURE  ORIF  RDISTAL ULNA FRACTURE APPENDECTOMY 1994 COLONOSCOPY NORMAL (HISTORY OF POLYPS) 05/07/2003): REPEAT IN 04/2008 UMBILICAL HERNIORRHAPHY (DR. MARTIN):(03/14/2005) MOHS SURGERY L EAR BASAL CELL 06/18/2007 HOSP DVT   [facility] (1/5-18/2009) COLONOSCOPY POLYPS X 3  INT HEMMS DIVERTICS (DR ELLIOTT) 07/14/2008 CATH EF 55% 75% AND TOTALLY OCCL BARANCHES OFF L CIRCUMFLEX TX MEDICALLY  (02/09/2009)  Family History: Last updated: 09/13/10 Father: ; DECEASED 20 YOA MI, ?HTN Mother: DECEASED 37 YOA ; DM, HTN, ALZHEIMERS, ?STROKE BROTHER A PROSTATE CANCER, DM SISTER A  DM SISTER A  OVERWEIGHT CV:+ CAD, FATHER MI,DECEASED  HBP: +MOTHER, ? + FATHER DM: + MOTHER, BROTHER GOUT/ARTHRITIS: PROSTATE/CANCER: + BROTHER BREAST/OVARIAN/UTERINE CANCER: COLON CANCER: DEPRESSION: NEGATIVE ETOH/DRUG ABUSE: NEGATIVE OTHER: NEGATIVE STROKE  Social History: Last updated: 09-13-2010 Marital Status: Married 1976, second time Children: 2 CHILDREN, 1 step daughter  Occupation: RETIRED Scientist, product/process development Tobacco Use - Former. -- 1990 Alcohol Use - yes - rarely enoys fishing  Risk Factors: Alcohol Use: <1 (06/07/2009) Caffeine Use: 0 (06/07/2009) Exercise: no (06/07/2009)  Risk Factors: Smoking Status: quit (06/07/2009) Packs/Day: 1992//20PYH (06/07/2009) Passive Smoke Exposure: no (06/07/2009)  Past Medical History: 1. Hypertension. 2. Spontaneous right leg DVT in January 2009.  Off coumadin after tx 3. Nephrolithiasis. 4. Hyperlipidemia. 5. Allergic rhinitis. 6. Last echocardiogram done in [place] in April 2010 after his MI showed EF of 50-55%, mild aortic insufficiency, and RV systolic pressure of 34 mmHg. 7. Coronary artery disease.  The patient had an MI in [place].  He was then transferred to [facility], this is in April 2010.  Troponin was greater than  60.  EKG showed no posterior infarct.  The patient did not have a heart catheterization.  He did have a Myoview showing a large lateral defect from a based apex, that was mainly fixed with some peri-infarct ischemia.  EF was 42%.  To define the patient's anatomy and to see if there were any options for revascularization by bypass surgery or PCI, if his territory was found to be viable,  we did do a left heart catheterization in May 2010, that showed EF of 55%, left ventricular end-diastolic pressure of 21 mmHg, and on coronary angiography, there were separate ostia to the LAD and the circumflex.  There was a 30-40% ostial LAD stenosis and 40% first diagonal stenosis.  The first obtuse marginal was totally occluded.  There was a 75% stenosis in the AV circumflex after the first obtuse marginal.  There was a 70% ostial stenosis in the second obtuse marginal.  There was an 80% stenosis in the circumflex after the second obtuse marginal (the circumflex was a small vessel at that point).  It was decided to manage the patient medically. 8. URETHRAL DIVERTICULUM (ICD-599.2) 9. 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 ERECTILE DYSFUNCTION (ICD-302.72) 12. HX, PERSONAL, COLONIC POLYPS (ICD-V12.72) 13. GERD (ICD-530.81) 14. DIVERTICULOSIS, COLON (ICD-562.10) 15. Diabetes mellitus, type II  Family History: Reviewed history from 08/25/2010 and no changes required. Father: ; DECEASED 60 YOA MI, ?HTN Mother: DECEASED 13 YOA ; DM, HTN, ALZHEIMERS, ?STROKE BROTHER A PROSTATE CANCER, DM SISTER A  DM SISTER A  OVERWEIGHT CV:+ CAD, FATHER MI,DECEASED  HBP: +MOTHER, ? + FATHER DM: + MOTHER, BROTHER GOUT/ARTHRITIS: PROSTATE/CANCER: + BROTHER BREAST/OVARIAN/UTERINE CANCER: COLON CANCER: DEPRESSION: NEGATIVE ETOH/DRUG ABUSE: NEGATIVE OTHER: NEGATIVE STROKE  Social History: Reviewed history from 08/25/2010 and no changes required. Marital Status: Married 1976, second time Children: 2 CHILDREN, 1 step daughter  Occupation: RETIRED Scientist, product/process development Tobacco Use - Former. -- 1990 Alcohol Use - yes - rarely enoys fishing  Review of Systems       All systems reviewed and negative except as per HPI.   Vital Signs:  Patient profile:   70 year old male Height:      67 inches Weight:      201  pounds BMI:     31.59 Pulse rate:   66 / minute BP sitting:   108 / 68  (left arm) Cuff size:   regular  Vitals Entered By: Lysbeth Galas CMA (September 12, 2010 2:26 PM)  Physical Exam  General:  Well developed, well nourished, in no acute distress. Neck:  Neck supple, no JVD. No masses, thyromegaly or abnormal cervical nodes. Lungs:  Clear bilaterally to auscultation and percussion. Heart:  Non-displaced PMI, chest non-tender; regular rate and rhythm, S1, S2 without rubs or gallops. 2/6 SEM RUSB.  Carotid upstroke normal, no bruit. Pedals normal pulses. 2+ edema 1/2 to knee on right.  Abdomen:  Bowel sounds positive; abdomen soft and non-tender without masses, organomegaly, or hernias noted. No hepatosplenomegaly. Extremities:  No clubbing or cyanosis. Neurologic:  Alert and oriented x 3. Psych:  Normal affect.   Impression & Recommendations:  Problem # 1:  MURMUR (ICD-785.2) Echo to assess systolic murmur.   Problem # 2:  CORONARY ARTERY DISEASE (ICD-414.00) No ischemic symptoms.  Continue ASA, statin, Coreg, lisinopril.   Problem # 3:  HYPERLIPIDEMIA (ICD-272.4) LDL at goal in 11/11 (< 70).  Would try coenzyme Q10 for mild myalgias.    Other  Orders: Echocardiogram (Echo)  Patient Instructions: 1)  Your physician recommends that you schedule a follow-up appointment in: 1 year 2)  Your physician has recommended you make the following change in your medication: Start Coenzyme Q 10 100mg  once daily.  3)  Your physician has requested that you have an echocardiogram.  TO BE SCHEDULED IN JAN 2012: Echocardiography is a painless test that uses sound waves to create images of your heart. It provides your doctor with information about the size and shape of your heart and how well your heart's chambers and valves are working.  This procedure takes approximately one hour. There are no restrictions for this procedure. Prescriptions: COENZYME Q10 100 MG CAPS (COENZYME Q10) Take 1 tablet by  mouth once a day  #30 x 6   Entered by:   Lanny Hurst RN   Authorized by:   Marca Ancona, MD   Signed by:   Lanny Hurst RN on 09/12/2010   Method used:   Electronically to        [id number] * (retail)       [address]       [place], [place]  [address]       Ph: [phone] or [phone]       Fax: [phone]   RxID:   [id number] LIPITOR 80 MG TABS (ATORVASTATIN CALCIUM) Take 1 tablet by mouth once a day  #30 x 6   Entered by:   Lanny Hurst RN   Authorized by:   Marca Ancona, MD   Signed by:   Lanny Hurst RN on 09/12/2010   Method used:   Electronically to        [id number] (retail)       [address]       [place], [place]  [address]       Ph: [phone] or [phone]       Fax: [phone]   RxID:   [id number]

## 2010-11-07 NOTE — Assessment & Plan Note (Signed)
Summary: 1 MONTH FOLLOW UP/[facility]   Vital Signs:  Patient profile:   70 year old male Weight:      199.75 pounds Temp:     97.8 degrees F oral Pulse rate:   60 / minute Pulse rhythm:   regular BP sitting:   118 / 78  (left arm) Cuff size:   large  Vitals Entered By: Sydell Axon LPN (November 08, 2009 8:59 AM) CC: One month follow-up   History of Present Illness: Pt here to recheck hematospermia. Was oput on prolonged Cipro whuich he tolerated well. he feels well at this point. He is off Coumadin and is taking OTC Vit D 1000Iu He feels well and has no complaints.  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) 8)  Varicose Veins Lower Extremities W/oth Comps  (ICD-454.8) 9)  Cervical Muscle Strain  (ICD-847.0) 10)  Encounter For Therapeutic Drug Monitoring  (ICD-V58.83) 11)  Encounter For Long-term Use of Anticoagulants  (ICD-V58.61) 12)  Dvt  (ICD-453.40) 13)  Renal Calculus, Left, Recurr 3/09  (ICD-592.0) 14)  Hematuria  (ICD-599.7) 15)  Screening For Malignant Neoplasm, Prostate  (ICD-V76.44) 16)  Dupuytren's Contracture, Left  (ICD-728.6) 17)  Hyperglycemia  (ICD-790.29) 18)  Erectile Dysfunction  (ICD-302.72) 19)  Hx, Personal, Colonic Polyps  (ICD-V12.72) 20)  Allergic Rhinitis, Seasonal  (ICD-477.0) 21)  Hypertension  (ICD-401.9) 22)  Hyperlipidemia  (ICD-272.4) 23)  Gerd  (ICD-530.81) 24)  Diverticulosis, Colon  (ICD-562.10) 25)  Advef, Drug/medicinal/biological Subst Nos  (ICD-995.20)  Medications Prior to Update: 1)  Allegra 180 Mg  Tabs (Fexofenadine Hcl) .Marland Kitchen.. 1 Daily  As Needed 2)  Ranitidine Hcl 150 Mg Caps (Ranitidine Hcl) .... One Tab By Mouth in Evening. 3)  Lipitor 80 Mg Tabs (Atorvastatin Calcium) .Marland Kitchen.. 1 At  Bedtime By Mouth 4)  Carvedilol 6.25 Mg Tabs (Carvedilol) .Marland Kitchen.. 1 Twice A Day By Mouth 5)  Warfarin Sodium 5 Mg Tabs (Warfarin Sodium) .... As Directed 6)  Aspirin 81 Mg Tbec (Aspirin) .... Take One Tablet By Mouth Daily 7)  Lisinopril 10 Mg Tabs (Lisinopril) .Marland Kitchen.. 1 Daily By Mouth 8)  Nexium 40 Mg Cpdr (Esomeprazole Magnesium) .Marland Kitchen.. 1 Daily By Mouth 9)  Levitra 20 Mg Tabs (Vardenafil Hcl) .... One Tab By Mouth One Hour Prior. 10)  Cipro 500 Mg Tabs (Ciprofloxacin Hcl) .... One Tab By Mouth Two Times A Day  Allergies: 1)  ! * Latex  Physical Exam  General:  Well-developed,well-nourished,in no acute distress; alert,appropriate and cooperative throughout examination. Head:  Normocephalic and atraumatic without obvious abnormalities. No apparent alopecia but has global male pattern  balding. Eyes:  Conjunctiva clear bilaterally.  Ears:  External ear exam shows no significant lesions or deformities.  Otoscopic examination reveals clear canals, tympanic membranes are intact bilaterally without bulging, retraction, inflammation or discharge. Hearing is grossly normal bilaterally. Nose:  External nasal examination shows no deformity or inflammation. Nasal mucosa are pink and moist without lesions or exudates. Mouth:  Oral mucosa and oropharynx without lesions or exudates.  Teeth in good repair. Neck:  No deformities, masses, or tenderness noted. Chest Wall:  No deformities, masses, tenderness or gynecomastia noted. Lungs:  Normal respiratory effort, chest expands symmetrically. Lungs are clear to auscultation, no crackles or wheezes. Heart:  Normal  rate and regular rhythm. S1 and S2 normal without gallop, murmur, click, rub or other extra sounds.   Impression & Recommendations:  Problem # 1:  HEMATOSPERMIA (ZOX-096.04) Assessment Improved Resolved.  Problem # 2:  UNSPECIFIED VITAMIN D DEFICIENCY (ICD-268.9) Assessment: Unchanged Stable, checked on curr dose last time and therapeutic.  Cont.  Problem # 3:  DVT (ICD-453.40) Assessment: Improved Keep walking.  Complete Medication List: 1)  Allegra 180 Mg Tabs (Fexofenadine hcl) .Marland Kitchen.. 1 daily  as needed 2)  Ranitidine Hcl 150 Mg Caps (Ranitidine hcl) .... One tab by mouth in evening. 3)  Lipitor 80 Mg Tabs (Atorvastatin calcium) .Marland Kitchen.. 1 at bedtime by mouth 4)  Carvedilol 6.25 Mg Tabs (Carvedilol) .Marland Kitchen.. 1 twice a day by mouth 5)  Aspirin 81 Mg Tbec (Aspirin) .... Take one tablet by mouth daily 6)  Lisinopril 10 Mg Tabs (Lisinopril) .Marland Kitchen.. 1 daily by mouth 7)  Nexium 40 Mg Cpdr (Esomeprazole magnesium) .Marland Kitchen.. 1 daily by mouth 8)  Levitra 20 Mg Tabs (Vardenafil hcl) .... One tab by mouth one hour prior. 9)  Vitamin D 1000 Unit Tabs (Cholecalciferol) .... One tab by mouth once daily  Current Allergies (reviewed today): ! * LATEX

## 2010-11-08 ENCOUNTER — Ambulatory Visit: Payer: Self-pay | Admitting: Family Medicine

## 2010-11-08 ENCOUNTER — Encounter: Payer: Self-pay | Admitting: *Deleted

## 2010-11-08 DIAGNOSIS — E119 Type 2 diabetes mellitus without complications: Secondary | ICD-10-CM

## 2010-11-09 NOTE — Assessment & Plan Note (Signed)
Summary: PAIN IN LEG   Vital Signs:  Patient profile:   70 year old male Height:      67 inches Weight:      197 pounds BMI:     30.97 Temp:     97.7 degrees F oral Pulse rate:   80 / minute Pulse rhythm:   regular BP sitting:   122 / 74  (left arm) Cuff size:   large  Vitals Entered By: Delilah Shan CMA  Dull) (October 23, 2010 8:33 AM) CC: Pain in leg   History of Present Illness: Ronnie Johnson and I both talked to patient about going to DM2 education.  He understood.   1st known DVT in 2009.  Off coumadin now.  Now withRL calf pain and R lower hamstring pain. R calf swelling and more prominent varicose veins . No FNAVD.  Not short of breath.  No CP.    Had been exercising more recently .  Allergies: 1)  ! * Latex  Past History:  Past Medical History: Last updated: 09/12/2010 1. Hypertension. 2. Spontaneous right leg DVT in January 2009.  Off coumadin after tx 3. Nephrolithiasis. 4. Hyperlipidemia. 5. Allergic rhinitis. 6. Last echocardiogram done in [place] in April 2010 after his MI showed EF of 50-55%, mild aortic insufficiency, and RV systolic pressure of 34 mmHg. 7. Coronary artery disease.  The patient had an MI in [place].  He was then transferred to [facility], this is in April 2010.  Troponin was greater than 60.  EKG showed no posterior infarct.  The patient did not have a heart catheterization.  He did have a Myoview showing a large lateral defect from a based apex, that was mainly fixed with some peri-infarct ischemia.  EF was 42%.  To define the patient's anatomy and to see if there were any options for revascularization by bypass surgery or PCI, if his territory was found to be viable, we did do a left heart catheterization in May 2010, that showed EF of 55%, left ventricular end-diastolic pressure of 21 mmHg, and on coronary angiography, there were separate ostia to the LAD and the circumflex.  There was a 30-40% ostial LAD stenosis and 40% first diagonal  stenosis.  The first obtuse marginal was totally occluded.  There was a 75% stenosis in the AV circumflex after the first obtuse marginal.  There was a 70% ostial stenosis in the second obtuse marginal.  There was an 80% stenosis in the circumflex after the second obtuse marginal (the circumflex was a small vessel at that point).  It was decided to manage the patient medically. 8. URETHRAL DIVERTICULUM (ICD-599.2) 9. 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 ERECTILE DYSFUNCTION (ICD-302.72) 12. HX, PERSONAL, COLONIC POLYPS (ICD-V12.72) 13. GERD (ICD-530.81) 14. DIVERTICULOSIS, COLON (ICD-562.10) 15. Diabetes mellitus, type II  Review of Systems       See HPI.  Otherwise negative.    Physical Exam  General:  no apparent distress normocephalic atraumatic regular rate and rhythm clear to auscultation bilaterally ext with callf circ: R 39 cm, L 34 cm varicose veins noted, more prominent on R calf.  no erythema on leg.  distally w/o normal cap refill   Impression & Recommendations:  Problem # 1:  LEG PAIN, RIGHT (ICD-729.5) will send for u/s to eval for DVT.  I got a call report at  ~11am-  likely old chronic changes in leg.  No acute DVT seen per Dr. [place].  I talked to patient.  This could be related to old changes from prev DVT.  No indication for anticoagulation.  I would elevated the leg and take tylenol.  follow up as needed.  He agrees and understands.  This may be due to a calf strain or irritation of existing varicose veins.  Call back as needed.   Orders: Radiology Referral (Radiology)  Complete Medication List: 1)  Allegra 180 Mg Tabs (Fexofenadine hcl) .Marland Kitchen.. 1 daily  as needed 2)  Ranitidine Hcl 150 Mg Caps (Ranitidine hcl) .... One tab by mouth in evening. 3)  Lipitor 80 Mg Tabs (Atorvastatin calcium) .... Take 1 tablet by mouth once a day 4)  Carvedilol 6.25 Mg Tabs (Carvedilol) .Marland Kitchen.. 1 twice a day by  mouth 5)  Aspirin 81 Mg Tbec (Aspirin) .... Take one tablet by mouth daily 6)  Lisinopril 10 Mg Tabs (Lisinopril) .Marland Kitchen.. 1 daily by mouth 7)  Nexium 40 Mg Cpdr (Esomeprazole magnesium) .Marland Kitchen.. 1 daily by mouth 8)  Levitra 20 Mg Tabs (Vardenafil hcl) .... One tab by mouth one hour prior. 9)  Vitamin D 1000 Unit Tabs (Cholecalciferol) .... One tab by mouth once daily 10)  Vitamin C 500 Mg Tabs (Ascorbic acid) .... Take 1 tablet by mouth once a day 11)  Coenzyme Q10 100 Mg Caps (Coenzyme q10) .... Take 1 tablet by mouth once a day  Patient Instructions: 1)  See Shirlee Limerick about your referral before your leave today.    Orders Added: 1)  Est. Patient Level IV [[address]109] 2)  Radiology Referral [Radiology]    Current Allergies (reviewed today): ! * LATEX

## 2010-11-09 NOTE — Letter (Signed)
Summary: Generic Letter  [facility]  [address] [place], [place] [address]   Phone: [phone]  Fax: [phone]    10/31/2010    Re:   Ronnie Johnson   [address]   [place], [place]  [address] DOB:  08-17-1941     Please administer the Zostavax vaccine to the above-mentioned patient.  Please advise US of the exact administration date so that we can include it in our patient flowsheet.         Dwana Curd Para March, M.D.  [facility]

## 2010-11-09 NOTE — Progress Notes (Signed)
Summary: wants order for zostavax  Phone Note Call from Patient   Caller: Spouse Summary of Call: Pt is asking for order for zostavax  to be sent to [facility] [address]  Her insurance will pay for this. Initial call taken by: Lowella Petties CMA, AAMA,  October 30, 2010 1:03 PM  Follow-up for Phone Call        Please sent order for zostavax to pharmacy and update immunization flowsheet.  thanks.  Follow-up by: Crawford Givens MD,  October 30, 2010 5:18 PM  Additional Follow-up for Phone Call Additional follow up Details #1::        Done.  Pharmacy and patient advised to inform us of the exact administration date for our flowsheet. Additional Follow-up by: Delilah Shan CMA Henrietta Cieslewicz Dull),  October 31, 2010 10:58 AM

## 2010-11-09 NOTE — Letter (Signed)
Summary: No Show/[facility]  No Show/[facility]   Imported By: Maryln Gottron 10/17/2010 11:10:24  _____________________________________________________________________  External Attachment:    Type:   Image     Comment:   External Document  Appended Document: No [facility] call patient about no show for appointment.  thanks.   Appended Document: No [facility] Patient is not interested in doing this at this time.

## 2010-11-15 NOTE — Letter (Signed)
SummaryScientist, physiological [facility]  [facility]   Imported By: Kassie Mends 11/10/2010 11:40:03  _____________________________________________________________________  External Attachment:    Type:   Image     Comment:   External Document

## 2010-11-23 NOTE — Medication Information (Signed)
Summary: [employer] planner & Supplies Order   Imported By: Kassie Mends 11/15/2010 09:56:51  _____________________________________________________________________  External Attachment:    Type:   Image     Comment:   External Document

## 2010-12-07 ENCOUNTER — Ambulatory Visit: Payer: Self-pay | Admitting: Family Medicine

## 2010-12-25 ENCOUNTER — Encounter: Payer: Self-pay | Admitting: *Deleted

## 2010-12-25 ENCOUNTER — Other Ambulatory Visit: Payer: Self-pay | Admitting: Family Medicine

## 2010-12-25 ENCOUNTER — Other Ambulatory Visit (INDEPENDENT_AMBULATORY_CARE_PROVIDER_SITE_OTHER): Payer: Medicare Other

## 2010-12-25 DIAGNOSIS — E119 Type 2 diabetes mellitus without complications: Secondary | ICD-10-CM

## 2010-12-25 LAB — HEMOGLOBIN A1C: Hgb A1c MFr Bld: 6.9 % — ABNORMAL HIGH (ref 4.6–6.5)

## 2010-12-28 ENCOUNTER — Encounter: Payer: Self-pay | Admitting: Family Medicine

## 2010-12-28 ENCOUNTER — Ambulatory Visit (INDEPENDENT_AMBULATORY_CARE_PROVIDER_SITE_OTHER): Payer: Medicare Other | Admitting: Family Medicine

## 2010-12-28 VITALS — BP 122/60 | HR 84 | Temp 97.5°F | Ht 66.0 in | Wt 191.1 lb

## 2010-12-28 DIAGNOSIS — E119 Type 2 diabetes mellitus without complications: Secondary | ICD-10-CM

## 2010-12-28 NOTE — Patient Instructions (Signed)
Keep working on [employer], exercising, and checking your sugar.  Bring your readings back in 3 months.  Check A1c before the OV.  3 month follow up- OV.  Take care.  Don't change your meds in the meantime.  Glad to see you today.

## 2010-12-28 NOTE — Assessment & Plan Note (Addendum)
Improved, no meds for DM2. On statin and ACE.  Continue work on diet and exercise.  Recheck in 3 months.  Labs d/w pt. >25 min spent with patient, at least half of which was spent on counseling [place]:[facility].

## 2010-12-28 NOTE — Progress Notes (Signed)
Diabetes:  Using medications without difficulties:no meds Hypoglycemic episodes:no Hyperglycemic episodes:no Feet problems:no Blood Sugars averaging: 100-125 in AM, <200 2h after supper He went to DM2 education  Meds, vitals, and allergies reviewed.   ROS: See HPI.  Otherwise negative.    GEN: nad, alert and oriented HEENT: mucous membranes moist NECK: supple w/o LA CV: rrr. PULM: ctab, no inc wob ABD: soft, +bs EXT: no edema SKIN: no acute rash  Diabetic foot exam: Normal inspection No skin breakdown No calluses  Normal DP pulses Normal sensation to light tough and monofilament Nails thickened Varicose veins noted

## 2011-01-05 ENCOUNTER — Ambulatory Visit (INDEPENDENT_AMBULATORY_CARE_PROVIDER_SITE_OTHER): Payer: Medicare Other | Admitting: Family Medicine

## 2011-01-05 ENCOUNTER — Encounter: Payer: Self-pay | Admitting: Family Medicine

## 2011-01-05 VITALS — BP 98/60 | HR 64 | Temp 97.5°F | Ht 67.0 in | Wt 187.0 lb

## 2011-01-05 DIAGNOSIS — I861 Scrotal varices: Secondary | ICD-10-CM

## 2011-01-05 NOTE — Assessment & Plan Note (Signed)
Likely source of bleed.  No intervention needed.  No red flags by hx.  Supportive underwear, local tx and fu prn.  These don't appear infected.  They aren't tender.

## 2011-01-05 NOTE — Progress Notes (Signed)
Per patient. He sat down and saw some blood on his underwear recently.  He has no urinary symptoms.  Not burning with urination.  He thinks there was an irritated place on his scrotum.  No blood in BM or per rectum.    He saw some blood on the bath towel a few days before this.    No scrotal pain.  No FCNAVD.  Feeling okay otherwise.  On 81mg  ASA.    H/o seasonal allergies, prev responded to allegra but this seems not to be working as well. Asking about other options.    Meds, vitals, and allergies reviewed.   ROS: See HPI.  Otherwise, noncontributory.  nad Testes bilaterally descended without nodularity, tenderness or masses. No scrotal masses but several small superficial scrotal varices noted, some of which appear to be mildly irriatated. . No penis lesions or urethral discharge.  Uncircumcised.

## 2011-01-05 NOTE — Patient Instructions (Addendum)
I would wear supportive underwear.  Nothing else needs to be done about this.  Let me know if you continue to have trouble.  Take care.  Glad to see you.  Try taking 10mg  of claritin/loratadine instead of the allegra.  You can use nasal saline too.

## 2011-01-07 ENCOUNTER — Ambulatory Visit: Payer: Self-pay | Admitting: Family Medicine

## 2011-02-20 NOTE — Assessment & Plan Note (Signed)
[facility] OFFICE NOTE   NAME:HENSLEYNed, Johnson                      MRN:          [id number]  DATE:02/21/2009                            DOB:          1940-12-15    PRIMARY CARE PHYSICIAN:  Arta Silence, MD   HISTORY OF PRESENT ILLNESS:  This 70 year old with a history of  hypertension, right leg DVT in January 2009, and recent posterior MI in  [place], [place] who presents for followup after a heart  catheterization.  The patient did initially come to see me after he had  been hospitalized in [place] with a posterior MI.  By the time he  had arrived at [facility] facility in [place], it had been about 48  hours after his myocardial infarction and he was not having anymore  chest pain, so he did not have a heart catheterization.  He did have a  stress test with a large lateral fixed defect, some peri-infarct  ischemia, and EF of 42%.  We did do a left heart catheterization on Feb 09, 2009, to get an idea of the patient's coronary anatomy to see if he  had disease that would be amenable to bypass surgery and secondly to see  if there would be any possibility of revascularization of his circumflex  artery, which was presumably the infarct-related artery.  If the artery  was not totally occluded, we thought that we would consider doing a  cardiac MRI to define his viability to see if percutaneous coronary  intervention would be an option.  However, we did do the left heart  catheterization and it did show that he probably had an MI due to a  totally occluded first obtuse marginal.  He did have significant disease  throughout his circumflex system.  It was decided to manage the patient  medically.  His LV systolic function was preserved on left  ventriculogram.  Since that time, the patient has been doing well.  He  has not had any recurrent chest pain or shortness of breath.  He feels  like  his strength is returning, he is becoming more active.   PAST MEDICAL HISTORY:  1. Hypertension.  2. Spontaneous right leg DVT in January 2009.  The patient is on      Coumadin since then.  3. Nephrolithiasis.  4. Hyperlipidemia.  5. Allergic rhinitis.  6. Last echocardiogram done in [place] in April 2010 after his MI      showed EF of 50-55%, mild aortic insufficiency, and RV systolic      pressure of 34 mmHg.  7. Coronary artery disease.  The patient had an MI in [place].  He      was then transferred to [facility], this is in April 2010.  Troponin      was greater than 60.  EKG showed no posterior infarct.  The patient      did not have a heart catheterization.  He did have a Myoview      showing a large lateral defect from a based apex, that was mainly  fixed with some peri-infarct ischemia.  EF was 42%.  To define the      patient's anatomy and to see if there were any options for      revascularization by bypass surgery or PCI, if his territory was      found to be viable, we did do a left heart catheterization in May      2010, that showed EF of 55%, left ventricular end-diastolic      pressure of 21 mmHg, and on coronary angiography, there was      separate ostia to the LAD and the circumflex.  There was a 30-40%      ostial LAD stenosis and 40% first diagonal stenosis.  The first      obtuse marginal was totally occluded.  There was a 75% stenosis in      the AV circumflex after the first obtuse marginal.  There was a 70%      ostial stenosis in the second obtuse marginal.  There was an 80%      stenosis in the circumflex after the second obtuse marginal (the      circumflex was a small vessel at that point).  It was decided to      manage the patient medically.   SOCIAL HISTORY:  The patient is married.  He is retired.  He lives in  [place].  He quit smoking 20 years ago.  Rarely drinks alcohol.   FAMILY HISTORY:  The patient's father died of a sudden cardiac death  at  the age of 70.   MEDICATIONS:  1. Carvedilol 6.25 mg twice a day.  2. Lisinopril 10 mg daily.  3. Coumadin.  4. Aspirin 81 mg daily.  5. Lipitor 80 mg daily.   PHYSICAL EXAMINATION:  VITAL SIGNS:  Blood pressure is 132/76, heart  rate is 60 and regular.  GENERAL:  This is a well-developed male in no apparent distress.  NEUROLOGIC:  Alert and oriented x3.  Normal affect.  NECK:  There is no JVD.  There is no thyromegaly or thyroid nodule.  CARDIOVASCULAR:  Regular, S1 and S2.  No S3, soft S4.  No murmur.  No  peripheral edema.  2+ posterior tibial pulses bilaterally.  No carotid  bruit.  ABDOMEN:  Soft, nontender.  No hepatosplenomegaly.  Normal bowel sounds.  EXTREMITIES:  No clubbing or cyanosis.   ASSESSMENT AND PLAN:  This is a 70 year old with a history of recent  posterior myocardial infarction who had a heart catheterization and  presents to Cardiology Clinic for followup.  1. Coronary artery disease.  The patient had a posterior myocardial      infarction on January 20, 2009.  By the time he was seen in a PCI-      capable center, he was no longer having chest pain and it was felt      that he had completed his myocardial infarction.  His Myoview did      show large lateral defect that was mainly fixed with some peri-      infarct ischemia with depressed ejection fraction of 42%.  We did      do a heart catheterization in May 2010 to assess his anatomy.  This      showed the infarct-related artery to be probably a totally occluded      first obtuse marginal.  There was significant diffuse disease in      the AV circumflex beyond the totally occluded first obtuse  marginal.  It was decided to manage the patient medically.  He has      had no further ischemic symptoms.  The patient does need to      continue on his beta-blocker, his ACE inhibitor, his aspirin, and      his Lipitor.  I would keep him on the current doses for now.  We      are also going to look into  getting him set up a cardiac rehab.  2. Ischemic cardiomyopathy.  The patient had mildly depressed ejection      fraction of 42% by Myoview in [place]; however, on      ventriculogram, his ejection fraction was 55%.  We will continue      him on his lisinopril and his carvedilol.  We will probably do a      followup echocardiogram in the near future.  3. Hypertension.  The patient's blood pressure is under good control.  4. Hyperlipidemia.  We will check the patient's lipids and LFTs in 3      months.     Marca Ancona, MD  Electronically Signed    DM/MedQ  DD: 02/21/2009  DT: 02/22/2009  Job #: [id number]   cc:   Arta Silence, MD

## 2011-02-20 NOTE — Assessment & Plan Note (Signed)
[facility]                            [place] OFFICE NOTE   NAME:Ronnie Johnson, Ronnie Johnson                      MRN:          [id number]  DATE:02/02/2009                            DOB:          1941/03/17    PRIMARY CARE PHYSICIAN:  Arta Silence, MD   HISTORY OF PRESENT ILLNESS:  This is a 70 year old with a history of  hypertension and a right leg deep venous thrombosis in January 2009 who  is seen after a recent posterior MI in [place] the [place] last  [place].  The patient was on a vacation in [place].  He developed  severe substernal chest pain and aching with nausea and some radiation  to his arms bilaterally.  He went to the hospital initially in  [place] at [facility].  His EKG at this hospital  apparently did not show any significant changes and his initial cardiac  enzymes were negative.  However, he was kept overnight and his cardiac  enzymes did increase considerably to a point where his troponin was  greater than 60.  He was transferred the next day to [facility] with plan  for a heart catheterization, however, he never had the heart cath as he  was not having any more chest pain.  They did a stress test which showed  a large lateral defect extending from the base of the apex which was  mainly fixed but with some peri-infarct ischemia.  EF was 42%.  It  appears that given the fact that he seemed to have completed his infarct  with a mainly fixed defect on Myoview and no further chest pain, that it  was decided not to do a heart catheterization at that time, and he was  discharged on medical management.  Since discharge from the hospital,  the patient has had no further chest pain.  He has felt fatigued and  weak.  However, he is not short of breath.  He has not been particularly  active since his heart attack.  Two days ago, he had a bout of severe  diarrhea and vomiting, however, that has resolved and he thinks  it might  have been due to food poisoning.  The patient did see Dr. Hetty Ely  yesterday and was referred over to see [place] today.  As mentioned, he has  had no further chest pain and his main complaint is currently of fatigue  and weakness.   PAST MEDICAL HISTORY:  1. Hypertension.  2. Spontaneous right leg DVT in January 2009, the patient has been on      Coumadin since then.  3. Nephrolithiasis.  4. Hyperlipidemia.  5. Allergic rhinitis.  6. Echocardiogram done in [place] in April 2010 after his MI showed      EF of 50-55%, 1+ aortic insufficiency, and RV systolic pressure of      34 mmHg.  7. Coronary artery disease. The patient did have a MI in [place]      and he was then transferred to [place].  Troponin was greater      than 60.  EKG currently  shows an old posterior infarct.  The      patient did not have heart catheterization.  He did have a Myoview      showing a large lateral defect from base to apex, it was mainly      fixed with some peri-infarct ischemia, EF was 42%.   SOCIAL HISTORY:  The patient is married.  He is retired.  He lives in  [place].  He quit smoking 20 years ago and rarely drinks alcohol.   FAMILY HISTORY:  The patient's father died of sudden cardiac death at  the age of 77.   MEDICATIONS:  1. Lisinopril 20 mg daily.  2. Coumadin.  3. Toprol-XL 50 mg daily.  4. Ranitidine 150 mg daily.   REVIEW OF SYSTEMS:  All systems were reviewed and negative except as  noted in the history of present illness.   PHYSICAL EXAMINATION:  VITAL SIGNS:  Blood pressure is 116/70, heart  rate 72 and regular, weight is 192 pounds.  GENERAL:  This is a well-developed male in no apparent distress.  NEUROLOGIC:  Alert and oriented x3.  Normal affect.  NECK:  There is no JVD.  There is no thyromegaly or thyroid nodule.  CARDIOVASCULAR:  Heart, regular S1 and S2.  There is no S3.  There is a  soft S4.  There is no murmur.  There is no peripheral edema.  There are  2+  posterior tibial pulses bilaterally.  There is no carotid bruit.  ABDOMEN:  Soft, nontender.  No hepatosplenomegaly.  Normal bowel sounds.  EXTREMITIES:  No clubbing or cyanosis.  SKIN:  Normal exam.  MUSCULOSKELETAL:  Normal exam.  HEENT:  Normal exam.   EKG done today shows normal sinus rhythm with an old posterior MI.   ASSESSMENT AND PLAN:  This is a 70 year old with the history of a recent  posterior MI by EKG as well as a large lateral defect on Myoview.  He  presents to Cardiology Clinic for evaluation.  1. Coronary artery disease.  The patient does appear to have had a      posterior MI on January 21, 2008.  By the time he was seen in a PCI-      capable Center, he was no longer having chest pain and it was      thought that he completed his MI.  His Myoview did show a large      lateral defect that was mainly fixed with some peri-infarct      ischemia with depressed EF of 42%.  The patient reports being      fatigued and weak.  He has had no further chest pain.  I discussed      with the patient's wife at length about how to proceed at this      point.  They did express some concern about not having the heart      catheterization in the hospital in [place], however, I did      explain to him that by the time he got to the hospital in      [place], it was well over 24 hours after his chest pain had      stopped.  By the time the heart catheterization had been planned,      it was past the weekend and it was number of days since the chest      pain had stopped, and I do not think especially given the Myoview  with the primarily fixed defect that early catheterization at that      point would have done a whole lot of good.  I do think it is      reasonable to do a heart catheterization to define his anatomy, one      to see if he has disease that would be amenable to bypass surgery      and secondly to see if there would be any possibility of      revascularization of his  circumflex artery which is presumably the      infarct-related artery.  If the artery is not completely occluded,      it is possible that he may have some viable territory and we might      consider a cardiac MRI in that situation to define viability.  We      will plan on having him hold his Coumadin on Sunday night.  He is      over a year out from his DVT, and it should be okay to hold it for      a few days.  We will do his heart catheterization in the [facility] lab on      Wednesday.  I did tell the patient to start taking aspirin 81 mg a      day.  I will also add high-dose of Lipitor 80 mg a day to his      regimen.  I will check his lipids and LFTs in 3 months.  He will      continue on his ACE inhibitor and given his decreased LV function,      I am going to change his beta-blocker from Toprol-XL to carvedilol      6.25 mg twice a day.  2. Ischemic cardiomyopathy.  The patient has a mild ischemic      cardiomyopathy by Myoview, EF 42%.  By echo, EF is about 50%.  He      is on ACE inhibitor and beta-blocker.  We are going to do a heart      catheterization and look for any possibility for effective      revascularization which would be disease that would be amenable to      bypass surgery or perhaps percutaneous coronary intervention if      there is a viable territory in the lateral wall.  3. Hypertension.  The patient's blood pressure is under good control      today.     Marca Ancona, MD  Electronically Signed    DM/MedQ  DD: 02/02/2009  DT: 02/03/2009  Job #: [id number]   cc:   Arta Silence, MD

## 2011-02-20 NOTE — Cardiovascular Report (Signed)
Ronnie Johnson, Ronnie Johnson               ACCOUNT NO.:  [id number]   MEDICAL RECORD NO.:  [id number]          PATIENT TYPE:  OIB   LOCATION:  1961                         FACILITY:  [facility]   PHYSICIAN:  Marca Ancona, MD      DATE OF BIRTH:  Mar 31, 1941   DATE OF PROCEDURE:  02/09/2009  DATE OF DISCHARGE:  02/09/2009                            CARDIAC CATHETERIZATION   PRIMARY CARE PHYSICIAN:  Arta Silence, MD   PROCEDURES:  1. Left heart catheterization.  2. Coronary angiography  3. Left ventriculography.   INDICATION:  Posterior MI with no intervention.   PROCEDURE:  After informed consent was obtained, the right groin was  sterilely prepped and draped, 1% lidocaine was used to locally  anesthetize the right groin area.  The left circumflex and the LAD had  separate ostia.  The LAD was cannulated using the 4-French JL-4 catheter  and the left complex artery was engaged using the 4-French JL-5  catheter.  The RCA was engaged using the 4-French 3DRC catheter and the  LV was entered using the 4-French angled pigtail catheter.  There were  no complications.   FINDINGS:  1. Hemodynamics; LV 109/11/21, aorta 133/75.  2. Left ventriculography.  An RAO ventriculogram was done, EF was      found to be 55% with no wall motion abnormalities.  3. Coronary angiography.  The coronary system is right dominant.  The      RCA showed only mild luminal irregularities.  The left circumflex      and the LAD had separate ostia.  There was a 30-40% ostial LAD      stenosis and there was a 40% ostial first diagonal stenosis.      Otherwise, the LAD had luminal irregularities.  The left circumflex      artery had significant disease.  There was a stump seen early off      the circumflex that presumably was a totally occluded first obtuse      marginal and just after the takeoff of this stump there was a 75%      stenosis with a filling defect in the AV circumflex.  There was a      70% stenosis of the  ostial second obtuse marginal and in the small-      caliber continuation of the AV circumflex after the second obtuse      marginal, there was an 80% stenosis.   ASSESSMENT AND PLAN:  This is a 70 year old who is status post  hospitalization several weeks ago for a myocardial infarction.  EKG  shows an old posterior myocardial infarction.  The MI appears to have  been due to an occluded first obtuse marginal.  The patient has  significant residual disease in the circumflex system.  He has had no  chest pain since his discharge from the hospital in [place] and his  Myoview done in [place] showed a fixed lateral defect.  I do not think  that there is any indication at this point for intervention.  His LAD  and his RCA are  widely patent.  His LV function does appear to be preserved.  We will  plan medical management.  He will continue on his lisinopril and his  carvedilol.  He is also on aspirin and Lipitor 80 mg a day.  He will  resume his Coumadin tonight.      Marca Ancona, MD  Electronically Signed     DM/MEDQ  D:  02/09/2009  T:  02/09/2009  Job:  [id number]   cc:   Arta Silence, MD

## 2011-02-23 NOTE — Op Note (Signed)
[name], Ronnie Johnson               ACCOUNT NO.:  [id number]   MEDICAL RECORD NO.:  [id number]          PATIENT TYPE:  AMB   LOCATION:  [facility]                         FACILITY:  [facility]   PHYSICIAN:  Thornton Park. Daphine Deutscher, MD  DATE OF BIRTH:  1940-11-29   DATE OF PROCEDURE:  03/14/2005  DATE OF DISCHARGE:                                 OPERATIVE REPORT   CCS [phone].   PREOPERATIVE DIAGNOSIS:  Umbilical hernia.   POSTOPERATIVE DIAGNOSIS:  Umbilical hernia containing omentum.   PROCEDURE:  Umbilical herniorrhaphy with a Ventralex mesh (6.4 x 6.4 cm).   DESCRIPTION OF PROCEDURE:  Antonyo Hinderer was taken to room [facility] at [facility] and given general anesthesia by endotracheal tube.  The  abdomen was shaved, prepped with Betadine and draped sterilely.  A  curvilinear incision was made.  The umbilicus was taken off the hernia sac,  which was actually fairly large.  I opened it and put my finger in and could  feel the omentum which was contained within the sac.  I elected to go ahead and leave most of the sac intact and through this  introduced a piece of Ventralex mesh, which I brought up to the edges of  fascia.  I then fixed it with four quadrant sutures of 0 Vicryl. This closed  the defect completely and as the mesh deployed, it was certainly more than  adequate in repairing the hole.  I then tacked the umbilical skin down to  the fascia with a 4-0 Vicryl and closed the wound with 4-0 Vicryl  subcutaneously with Benzoin Steri-Strips.  I did inject the fascia with 0.5%  Marcaine.  The patient was taken to the recovery room.  He will be given  Tylox to take for pain and will be asked to return to the office in about  three weeks.       MBM/MEDQ  D:  03/14/2005  T:  03/15/2005  Job:  [id number]   cc:   Laurita Quint, M.D.  [address] [place]  [place] [address]  Fax: [phone]

## 2011-03-12 NOTE — Progress Notes (Signed)
Addended by: Baldomero Lamy on: 03/12/2011 10:43 AM   Modules accepted: Orders

## 2011-03-13 ENCOUNTER — Other Ambulatory Visit (INDEPENDENT_AMBULATORY_CARE_PROVIDER_SITE_OTHER): Payer: Medicare Other | Admitting: Family Medicine

## 2011-03-13 DIAGNOSIS — E119 Type 2 diabetes mellitus without complications: Secondary | ICD-10-CM

## 2011-03-15 ENCOUNTER — Encounter: Payer: Self-pay | Admitting: Family Medicine

## 2011-03-15 ENCOUNTER — Ambulatory Visit (INDEPENDENT_AMBULATORY_CARE_PROVIDER_SITE_OTHER): Payer: Medicare Other | Admitting: Family Medicine

## 2011-03-15 DIAGNOSIS — I10 Essential (primary) hypertension: Secondary | ICD-10-CM

## 2011-03-15 DIAGNOSIS — E119 Type 2 diabetes mellitus without complications: Secondary | ICD-10-CM

## 2011-03-15 DIAGNOSIS — Z125 Encounter for screening for malignant neoplasm of prostate: Secondary | ICD-10-CM

## 2011-03-15 NOTE — Progress Notes (Signed)
Diabetes:  Using medications without difficulties: no meds Hypoglycemic episodes:no Hyperglycemic episodes: no sig elevation Feet problems:no Blood Sugars averaging:~120 in AM, 140 2 hours after a meal eye exam within last year: about 1 year ago- he'll check on follow up for this Labs reviewed with patient, A1c unchanged.  Walking for exercise along with weight training.  Working on diet- we talked about low carb diet.  He 'fell off the wagon' on vacation, but is back on a normal diet now.  Hypertension:    Using medication without problems or lightheadedness: yes Chest pain with exertion:no Edema:no Short of breath:no  PMH and SH reviewed  Meds, vitals, and allergies reviewed.   ROS: See HPI.  Otherwise negative.    GEN: nad, alert and oriented HEENT: mucous membranes moist NECK: supple w/o LA CV: rrr. PULM: ctab, no inc wob ABD: soft, +bs EXT: no edema SKIN: no acute rash  Diabetic foot exam: Normal inspection No skin breakdown Superficial calluses noted Normal DP pulses Normal sensation to light touch and monofilament Nails with onychomycosis

## 2011-03-15 NOTE — Assessment & Plan Note (Signed)
>  25 min spent with face to face with patient >25% counseling.  A1c at goal, no new meds.  Continue diet and exercise.  He understood.

## 2011-03-15 NOTE — Patient Instructions (Addendum)
Don't change your meds.  I would get a physical in 11/12.  Keep exercising and working on your diet. Take care. Glad to see you.

## 2011-03-15 NOTE — Assessment & Plan Note (Signed)
Controlled, d/w pt about rationale for meds.  No changes.  He understood.

## 2011-03-27 ENCOUNTER — Other Ambulatory Visit: Payer: Medicare Other

## 2011-04-03 ENCOUNTER — Ambulatory Visit: Payer: Medicare Other | Admitting: Family Medicine

## 2011-04-05 ENCOUNTER — Ambulatory Visit: Payer: Medicare Other | Admitting: Family Medicine

## 2011-08-29 ENCOUNTER — Ambulatory Visit (INDEPENDENT_AMBULATORY_CARE_PROVIDER_SITE_OTHER): Payer: Medicare Other

## 2011-08-29 DIAGNOSIS — Z23 Encounter for immunization: Secondary | ICD-10-CM

## 2011-08-31 ENCOUNTER — Other Ambulatory Visit (INDEPENDENT_AMBULATORY_CARE_PROVIDER_SITE_OTHER): Payer: Medicare Other

## 2011-08-31 DIAGNOSIS — Z125 Encounter for screening for malignant neoplasm of prostate: Secondary | ICD-10-CM

## 2011-08-31 DIAGNOSIS — E119 Type 2 diabetes mellitus without complications: Secondary | ICD-10-CM

## 2011-08-31 LAB — COMPREHENSIVE METABOLIC PANEL
Alkaline Phosphatase: 62 U/L (ref 39–117)
BUN: 21 mg/dL (ref 6–23)
CO2: 27 mEq/L (ref 19–32)
Creatinine, Ser: 0.9 mg/dL (ref 0.4–1.5)
GFR: 88.79 mL/min (ref >60.00)
Glucose, Bld: 137 mg/dL — ABNORMAL HIGH (ref 70–99)
Total Bilirubin: 1 mg/dL (ref 0.3–1.2)

## 2011-08-31 LAB — LIPID PANEL
Cholesterol: 153 mg/dL (ref 0–200)
HDL: 45.2 mg/dL (ref >39.00)
Triglycerides: 67 mg/dL (ref 0.0–149.0)
VLDL: 13.4 mg/dL (ref 0.0–40.0)

## 2011-08-31 LAB — PSA: PSA: 1.4 ng/mL (ref 0.10–4.00)

## 2011-08-31 LAB — HEMOGLOBIN A1C: Hgb A1c MFr Bld: 7 % — ABNORMAL HIGH (ref 4.6–6.5)

## 2011-09-03 ENCOUNTER — Encounter: Payer: Medicare Other | Admitting: Family Medicine

## 2011-09-03 ENCOUNTER — Other Ambulatory Visit: Payer: Medicare Other

## 2011-09-06 ENCOUNTER — Ambulatory Visit (INDEPENDENT_AMBULATORY_CARE_PROVIDER_SITE_OTHER): Payer: Medicare Other | Admitting: Family Medicine

## 2011-09-06 ENCOUNTER — Encounter: Payer: Self-pay | Admitting: Family Medicine

## 2011-09-06 VITALS — BP 122/60 | HR 55 | Temp 97.5°F | Wt 189.0 lb

## 2011-09-06 DIAGNOSIS — Z1211 Encounter for screening for malignant neoplasm of colon: Secondary | ICD-10-CM

## 2011-09-06 DIAGNOSIS — Z125 Encounter for screening for malignant neoplasm of prostate: Secondary | ICD-10-CM

## 2011-09-06 DIAGNOSIS — I10 Essential (primary) hypertension: Secondary | ICD-10-CM

## 2011-09-06 DIAGNOSIS — R011 Cardiac murmur, unspecified: Secondary | ICD-10-CM

## 2011-09-06 DIAGNOSIS — E785 Hyperlipidemia, unspecified: Secondary | ICD-10-CM

## 2011-09-06 DIAGNOSIS — I2510 Atherosclerotic heart disease of native coronary artery without angina pectoris: Secondary | ICD-10-CM

## 2011-09-06 DIAGNOSIS — Z23 Encounter for immunization: Secondary | ICD-10-CM

## 2011-09-06 DIAGNOSIS — E119 Type 2 diabetes mellitus without complications: Secondary | ICD-10-CM

## 2011-09-06 MED ORDER — TETANUS-DIPHTHERIA TOXOIDS TD 2-2 LF/0.5ML IM SUSP: 0.5000 mL | Freq: Once | INTRAMUSCULAR | Status: DC

## 2011-09-06 NOTE — Progress Notes (Signed)
Diabetes:  Using medications without difficulties:no Hypoglycemic episodes: no sx Hyperglycemic episodes: no sx Feet problems: no Blood Sugars averaging: not checked frequently eye exam within last year: yes, in last 12 months per patient He had been exercising [place] and [place].  He'd been traveling over the last 6 weeks and that affected diet.    Hypertension:    Using medication without problems or lightheadedness: yes Chest pain with exertion:no Edema:no Short of breath:no Average home BPs: similar to today  Elevated Cholesterol: Using medications without problems: yes Muscle aches: mild Diet compliance:as above Exercise: as above  MI.  Had seen cards last year.  Echo done last year and reviewed (murmur).  No CP, SOB, BLE edema.  Colonoscopy prev done.  PSA wnl.  Vaccines up to date except for Td, that is to be done today.    PMH and SH reviewed.   Vital signs, Meds and allergies reviewed.  ROS: See HPI.  Otherwise nontributory.   GEN: nad, alert and oriented HEENT: mucous membranes moist NECK: supple w/o LA CV: rrr.  Soft murmur noted PULM: ctab, no inc wob ABD: soft, +bs EXT: no edema but R calf with venous stasis changes (w/o breakdown) and inc in circ- unchanged since prev DVT.  Calf not ttp SKIN: no acute rash Prostate gland firm and smooth, no enlargement, nodularity, tenderness, mass, asymmetry or induration.  Diabetic foot exam: Normal inspection No skin breakdown No calluses  Normal DP pulses Normal sensation to light tough and monofilament Nails thickened

## 2011-09-06 NOTE — Patient Instructions (Addendum)
Call the cardiology clinic about getting a yearly appointment I would get OTC co-enzyme Q10 and take it daily.  See if that helps with the aches. Recheck A1c in 3/13 and then come see me a few days later.  30 min OV.  Take care.  Glad to see you.

## 2011-09-07 ENCOUNTER — Encounter: Payer: Self-pay | Admitting: Family Medicine

## 2011-09-07 NOTE — Assessment & Plan Note (Signed)
No meds, needs to work on diet and exercise.

## 2011-09-07 NOTE — Assessment & Plan Note (Signed)
Cont meds, work on diet/execise.

## 2011-09-07 NOTE — Assessment & Plan Note (Signed)
Cont meds, work on diet/exercise.

## 2011-09-07 NOTE — Assessment & Plan Note (Signed)
No sig change on exam

## 2011-09-07 NOTE — Assessment & Plan Note (Signed)
S/p colonoscopy.

## 2011-09-07 NOTE — Assessment & Plan Note (Signed)
PSA wnl, exam unremarkable

## 2011-09-07 NOTE — Assessment & Plan Note (Signed)
Continue current meds, he'll call about f/u with cards.

## 2011-10-15 ENCOUNTER — Other Ambulatory Visit: Payer: Self-pay | Admitting: *Deleted

## 2011-10-15 MED ORDER — ESOMEPRAZOLE MAGNESIUM 40 MG PO CPDR: 40.0000 mg | DELAYED_RELEASE_CAPSULE | Freq: Every day | ORAL | Status: DC

## 2011-10-15 NOTE — Telephone Encounter (Signed)
Patient's wife called and requested a refill on his Nexium 40 mg which was not on his med sheet. Patient's wife states that this has not been refilled for a while because he does not take it every day, but does request that it be sent to his mail order pharmacy for a 90 day supply.

## 2011-10-18 ENCOUNTER — Telehealth: Payer: Self-pay | Admitting: Internal Medicine

## 2011-10-18 NOTE — Telephone Encounter (Signed)
I called pt.  He isn't sob.  No fever, no body aches or chills.  The congestion was more troubling than the cough.  He can take plain mucinex with plenty of fluids and then f/u prn.

## 2011-10-18 NOTE — Telephone Encounter (Signed)
Patient called and stated he has head congestion and cough x 2weeks.  Productive cough with yellow mucous.  No fever, no body aches or chills, wanted to know if you could call him in something for the cough.  He has tried [employer], Sudafed and Nyquil cold and flu and nothing seems to work.

## 2011-11-21 ENCOUNTER — Ambulatory Visit (INDEPENDENT_AMBULATORY_CARE_PROVIDER_SITE_OTHER): Payer: Medicare Other | Admitting: Cardiology

## 2011-11-21 ENCOUNTER — Encounter: Payer: Self-pay | Admitting: Cardiology

## 2011-11-21 VITALS — BP 133/71 | HR 63 | Ht 67.0 in | Wt 193.8 lb

## 2011-11-21 DIAGNOSIS — I2510 Atherosclerotic heart disease of native coronary artery without angina pectoris: Secondary | ICD-10-CM

## 2011-11-21 DIAGNOSIS — E785 Hyperlipidemia, unspecified: Secondary | ICD-10-CM

## 2011-11-21 MED ORDER — ROSUVASTATIN CALCIUM 20 MG PO TABS: 20.0000 mg | ORAL_TABLET | Freq: Every day | ORAL | Status: DC

## 2011-11-21 NOTE — Patient Instructions (Signed)
Stop Lipitor. Start on Crestor 20 mg one tablet daily at bedtime. Start on CO Q10 200 mg one tablet daily. Follow up with Dr. Shirlee Latch in 1 year. Need to come for Lipid/liver in two months.

## 2011-11-21 NOTE — Progress Notes (Signed)
PCP: Dr. Para March  71 yo with history of CAD s/p MI and DVT presents for followup. He has been doing well since I last saw him. He has continued to have myalgias with atorvastatin.  He has had no significant chest pain or exertional dyspnea.  Ronnie Johnson exercises at the gym several times a week. He will walk 1.5 miles on a treadmill and then do weights.   Labs (8/10): LDL 41, HDL 41, LFTs normal  Labs (3/11): K 4.4, creatinine 0.87  Labs (11/11): LDL 61, HDL 41, K 4.7, creatinine 1.0, LFTs normal  Labs (11/12): LDL 94, HDL 45, K 5.2, creatinine 0.9  ECG: NSR, normal   Allergies (verified):  1) ! * Latex   Family History:  Father: ; DECEASED 72 YOA MI, ?HTN  Mother: DECEASED 68 YOA ; DM, HTN, ALZHEIMERS, ?STROKE  BROTHER A PROSTATE CANCER, DM  SISTER A DM  SISTER A OVERWEIGHT  CV:+ CAD, FATHER MI,DECEASED  HBP: +MOTHER, ? + FATHER  DM: + MOTHER, BROTHER  GOUT/ARTHRITIS:  PROSTATE/CANCER: + BROTHER  BREAST/OVARIAN/UTERINE CANCER:  COLON CANCER:  DEPRESSION: NEGATIVE  ETOH/DRUG ABUSE: NEGATIVE  OTHER: NEGATIVE STROKE   Social History:  Marital Status: Married 1976, second time  Children: 2 CHILDREN, 1 step daughter  Occupation: RETIRED Scientist, product/process development  Tobacco Use - Former. -- 1990  Alcohol Use - yes - rarely  enjoys fishing   Past Medical History:  1. Hypertension.  2. Spontaneous right leg DVT in January 2009. Off coumadin after tx.  3. Nephrolithiasis.  4. Hyperlipidemia.  5. Allergic rhinitis.  6. Last echocardiogram done in [place] in April 2010 after his MI showed EF of 50-55%, mild aortic insufficiency, and RV systolic pressure of 34 mmHg.  7. Coronary artery disease. The patient had an MI in [place]. He was then transferred to [facility], this is in April 2010. Troponin was greater than 60. EKG showed no posterior infarct. The patient did not have a heart catheterization. He did have a Myoview showing a large lateral defect from a based apex,  that was mainly fixed with some peri-infarct ischemia. EF was 42%. To define the patient's anatomy and to see if there were any options for revascularization by bypass surgery or PCI, if his territory was found to be viable, we did do a left heart catheterization in May 2010, that showed EF of 55%, left ventricular end-diastolic pressure of 21 mmHg, and on coronary angiography, there were separate ostia to the LAD and the circumflex. There was a 30-40% ostial LAD stenosis and 40% first diagonal stenosis. The first obtuse marginal was totally occluded. There was a 75% stenosis in the AV circumflex after the first obtuse marginal. There was a 70% ostial stenosis in the second obtuse marginal. There was an 80% stenosis in the circumflex after the second obtuse marginal (the circumflex was a small vessel at that point). It was decided to manage the patient medically.  8. 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 ERECTILE DYSFUNCTION (ICD-302.72)  12. HX, PERSONAL, COLONIC POLYPS (ICD-V12.72)  13. GERD (ICD-530.81)  14. DIVERTICULOSIS, COLON (ICD-562.10)  15. Diabetes mellitus, type II  ROS: All systems reviewed and negative except as per HPI.   Current Outpatient Prescriptions  Medication Sig Dispense Refill  . Ascorbic Acid (VITAMIN C) 500 MG tablet Take 500 mg by mouth daily.        Marland Kitchen aspirin 81 MG tablet Take 81  mg by mouth daily.        . carvedilol (COREG) 6.25 MG tablet Take 6.25 mg by mouth 2 (two) times daily with meals.        . cholecalciferol (VITAMIN D) 1000 UNITS tablet Take 1,000 Units by mouth daily.        Marland Kitchen esomeprazole (NEXIUM) 40 MG capsule Take 1 capsule (40 mg total) by mouth daily.  90 capsule  3  . fluorouracil (EFUDEX) 5 % cream       . lisinopril (PRINIVIL,ZESTRIL) 10 MG tablet Take 1/2 tablet by mouth each day.      . loratadine (CLARITIN) 10 MG tablet Take 10 mg by  mouth daily.        . Coenzyme Q10 200 MG TABS Take 1 tablet (200 mg total) by mouth daily.      . rosuvastatin (CRESTOR) 20 MG tablet Take 1 tablet (20 mg total) by mouth at bedtime.  30 tablet  6   Current Facility-Administered Medications  Medication Dose Route Frequency Provider Last Rate Last Dose  . diptheria-tetanus toxoids (DECAVAC) 2-2 LF/0.5ML injection 0.5 mL  0.5 mL Intramuscular Once Joaquim Nam, MD        BP 133/71  Pulse 63  Ht 5\' 7"  (1.702 m)  Wt 87.907 kg (193 lb 12.8 oz)  BMI 30.35 kg/m2 General: NAD Neck: No JVD, no thyromegaly or thyroid nodule.  Lungs: Clear to auscultation bilaterally with normal respiratory effort. CV: Nondisplaced PMI.  Heart regular S1/S2, no S3/S4, no murmur.  1+ edema 1/2 to knee on right, varicosities to right knee.  No carotid bruit.  Normal pedal pulses.  Abdomen: Soft, nontender, no hepatosplenomegaly, no distention.  Neurologic: Alert and oriented x 3.  Extremities: No clubbing or cyanosis.

## 2011-11-25 NOTE — Assessment & Plan Note (Signed)
Myalgias with atorvastatin.  Stop atorvastatin, then after 1 week start Crestor 20.  Will see if he has less myalgias on this regimen.  Start coenzyme Q 10 200 mg daily. Lipids/LFTs in 2 months.

## 2011-11-25 NOTE — Assessment & Plan Note (Signed)
Stable with no exertional chest pain or dyspnea.  Continue ASA, lisinopril, and statin.  ECG normal.

## 2011-12-07 ENCOUNTER — Encounter: Payer: Self-pay | Admitting: Family Medicine

## 2011-12-07 ENCOUNTER — Ambulatory Visit (INDEPENDENT_AMBULATORY_CARE_PROVIDER_SITE_OTHER): Payer: Medicare Other | Admitting: Family Medicine

## 2011-12-07 VITALS — BP 140/68 | HR 61 | Temp 97.5°F | Wt 197.0 lb

## 2011-12-07 DIAGNOSIS — L0291 Cutaneous abscess, unspecified: Secondary | ICD-10-CM

## 2011-12-07 DIAGNOSIS — L0390 Cellulitis, unspecified: Secondary | ICD-10-CM

## 2011-12-07 MED ORDER — SULFAMETHOXAZOLE-TRIMETHOPRIM 800-160 MG PO TABS: 2.0000 | ORAL_TABLET | Freq: Two times a day (BID) | ORAL | Status: AC

## 2011-12-07 NOTE — Progress Notes (Signed)
Back of L lower leg.  Noted about 3 days ago. Thought it was a possible spider bite. He felt a knot initially.  No known trauma.  Sore to touch. Last night it was more tender.  It feels a little hot. No other sx other than FU treatment for AK on scalp.    Meds, vitals, and allergies reviewed.   ROS: See HPI.  Otherwise, noncontributory.  nad FU changes on scalp noted.  3cm red area c/w cellulitis on inferior portion of L posterior lower leg, w/o fluctuant mass.   Border marked.

## 2011-12-07 NOTE — Assessment & Plan Note (Signed)
No indication for I&D, start septra and f/u prn.  He'll f/u with derm for other issues.

## 2011-12-07 NOTE — Patient Instructions (Signed)
Let us know if the red area spreads or doesn't get better.  This should gradually improve.  Start the antibiotics today.  Take care.

## 2011-12-18 ENCOUNTER — Other Ambulatory Visit: Payer: Self-pay

## 2011-12-24 ENCOUNTER — Other Ambulatory Visit (INDEPENDENT_AMBULATORY_CARE_PROVIDER_SITE_OTHER): Payer: Medicare Other

## 2011-12-24 DIAGNOSIS — I2510 Atherosclerotic heart disease of native coronary artery without angina pectoris: Secondary | ICD-10-CM

## 2011-12-24 DIAGNOSIS — E119 Type 2 diabetes mellitus without complications: Secondary | ICD-10-CM

## 2011-12-24 DIAGNOSIS — E785 Hyperlipidemia, unspecified: Secondary | ICD-10-CM

## 2011-12-31 ENCOUNTER — Ambulatory Visit (INDEPENDENT_AMBULATORY_CARE_PROVIDER_SITE_OTHER): Payer: Medicare Other | Admitting: Family Medicine

## 2011-12-31 ENCOUNTER — Encounter: Payer: Self-pay | Admitting: Family Medicine

## 2011-12-31 VITALS — BP 126/58 | HR 67 | Temp 97.6°F | Wt 192.0 lb

## 2011-12-31 DIAGNOSIS — E785 Hyperlipidemia, unspecified: Secondary | ICD-10-CM

## 2011-12-31 DIAGNOSIS — C4492 Squamous cell carcinoma of skin, unspecified: Secondary | ICD-10-CM | POA: Insufficient documentation

## 2011-12-31 DIAGNOSIS — E119 Type 2 diabetes mellitus without complications: Secondary | ICD-10-CM

## 2011-12-31 NOTE — Progress Notes (Addendum)
His prev leg lesion resolved.    Diabetes:  Using medications without difficulties: no meds.  Hypoglycemic episodes:no sx Hyperglycemic episodes: no sx Feet problems:no Blood Sugars averaging:110's in Am eye exam within last year: yes He's on the way to the gym now.  He's exercising more now.   A1c 7.  Stable.    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   Aches improved off lipitor and on coQ10 and crestor.  Due for recheck in April.    PMH and SH reviewed  Meds, vitals, and allergies reviewed.   ROS: See HPI.  Otherwise negative.    GEN: nad, alert and oriented HEENT: mucous membranes moist, Biopsy site on R ear is healing and L temple excision appears healed NECK: supple w/o LA CV: rrr. PULM: ctab, no inc wob ABD: soft, +bs EXT: no edema SKIN: no acute rash  Diabetic foot exam: Normal inspection No skin breakdown No calluses  Normal DP pulses Normal sensation to light touch and monofilament Nails thickened

## 2011-12-31 NOTE — Assessment & Plan Note (Signed)
Per derm, on L forehead appears well healed.

## 2011-12-31 NOTE — Assessment & Plan Note (Signed)
Continue as is.  D/w pt about diet and exercise.  No meds.  A1c okay.

## 2011-12-31 NOTE — Assessment & Plan Note (Signed)
Recheck lipids in 4/13.  Continue as is.  D/w pt about diet and exercise.

## 2011-12-31 NOTE — Patient Instructions (Addendum)
Keep working on your diet and keep exercising.  Recheck A1c in 4 months.  Glad to see you.   Recheck cholesterol in April.   Take care.  Physical this fall.

## 2012-01-01 ENCOUNTER — Other Ambulatory Visit: Payer: Self-pay | Admitting: *Deleted

## 2012-01-01 MED ORDER — ROSUVASTATIN CALCIUM 20 MG PO TABS: 20.0000 mg | ORAL_TABLET | Freq: Every day | ORAL | Status: DC

## 2012-01-28 ENCOUNTER — Ambulatory Visit (INDEPENDENT_AMBULATORY_CARE_PROVIDER_SITE_OTHER): Payer: Medicare Other

## 2012-01-28 DIAGNOSIS — E785 Hyperlipidemia, unspecified: Secondary | ICD-10-CM

## 2012-01-28 DIAGNOSIS — E119 Type 2 diabetes mellitus without complications: Secondary | ICD-10-CM

## 2012-01-28 DIAGNOSIS — I2510 Atherosclerotic heart disease of native coronary artery without angina pectoris: Secondary | ICD-10-CM

## 2012-01-29 LAB — HEMOGLOBIN A1C

## 2012-01-29 LAB — HEPATIC FUNCTION PANEL
AST: 16 IU/L (ref 0–40)
Alkaline Phosphatase: 63 IU/L (ref 25–160)
Total Protein: 6.5 g/dL (ref 6.0–8.5)

## 2012-01-29 LAB — LIPID PANEL
HDL: 44 mg/dL (ref >39)
VLDL Cholesterol Cal: 21 mg/dL (ref 5–40)

## 2012-04-28 ENCOUNTER — Encounter: Payer: Self-pay | Admitting: Family Medicine

## 2012-04-28 ENCOUNTER — Ambulatory Visit (INDEPENDENT_AMBULATORY_CARE_PROVIDER_SITE_OTHER): Payer: Medicare Other | Admitting: Family Medicine

## 2012-04-28 VITALS — BP 122/72 | HR 64 | Temp 97.5°F | Wt 190.0 lb

## 2012-04-28 DIAGNOSIS — M25519 Pain in unspecified shoulder: Secondary | ICD-10-CM

## 2012-04-28 MED ORDER — IBUPROFEN 600 MG PO TABS: 600.0000 mg | ORAL_TABLET | Freq: Three times a day (TID) | ORAL | Status: AC | PRN

## 2012-04-28 NOTE — Progress Notes (Signed)
5 weeks of L shoulder and arm pain. He can lift the arm but with caution.  Initially he had sig pain limiting abduction.  No trauma.  No chest pain. No other muscle aches.  He has taken rare doses of ibuprofen.  It helped some.  On statin but this doesn't feel similar to lipitor intolerance.    Meds, vitals, and allergies reviewed.   ROS: See HPI.  Otherwise, noncontributory.  nad ncat Normal ROM in the neck L should with pain on int/ext rotation, supraspinatus weak on testing.  Speed's negative. Distally nv intact.  + impingement.

## 2012-04-28 NOTE — Assessment & Plan Note (Signed)
Refer to ortho.  Concern for cuff tear.  ROM exercising in meantime. nsaids with GI caution, use sparingly.  Anatomy d/w pt.

## 2012-04-28 NOTE — Patient Instructions (Addendum)
See Ronnie Johnson about your referral before you leave today. Use the exercises we discussed (stir the pot and wall walking). Take ibuprofen with food.  Take care.

## 2012-08-12 ENCOUNTER — Ambulatory Visit: Payer: Medicare Other

## 2012-08-14 ENCOUNTER — Ambulatory Visit (INDEPENDENT_AMBULATORY_CARE_PROVIDER_SITE_OTHER): Payer: Medicare Other

## 2012-08-14 DIAGNOSIS — Z23 Encounter for immunization: Secondary | ICD-10-CM

## 2012-08-23 ENCOUNTER — Other Ambulatory Visit: Payer: Self-pay | Admitting: Family Medicine

## 2012-08-23 DIAGNOSIS — Z125 Encounter for screening for malignant neoplasm of prostate: Secondary | ICD-10-CM

## 2012-08-23 DIAGNOSIS — E119 Type 2 diabetes mellitus without complications: Secondary | ICD-10-CM

## 2012-09-02 ENCOUNTER — Other Ambulatory Visit: Payer: Medicare Other

## 2012-09-03 ENCOUNTER — Other Ambulatory Visit (INDEPENDENT_AMBULATORY_CARE_PROVIDER_SITE_OTHER): Payer: Medicare Other

## 2012-09-03 DIAGNOSIS — E119 Type 2 diabetes mellitus without complications: Secondary | ICD-10-CM

## 2012-09-03 DIAGNOSIS — Z125 Encounter for screening for malignant neoplasm of prostate: Secondary | ICD-10-CM

## 2012-09-03 LAB — COMPREHENSIVE METABOLIC PANEL
AST: 18 U/L (ref 0–37)
Alkaline Phosphatase: 59 U/L (ref 39–117)
BUN: 17 mg/dL (ref 6–23)
Creatinine, Ser: 0.9 mg/dL (ref 0.4–1.5)
Potassium: 4.6 mEq/L (ref 3.5–5.1)

## 2012-09-03 LAB — LIPID PANEL
Cholesterol: 104 mg/dL (ref 0–200)
LDL Cholesterol: 47 mg/dL (ref 0–99)
Triglycerides: 60 mg/dL (ref 0.0–149.0)
VLDL: 12 mg/dL (ref 0.0–40.0)

## 2012-09-03 LAB — HEMOGLOBIN A1C: Hgb A1c MFr Bld: 7.2 % — ABNORMAL HIGH (ref 4.6–6.5)

## 2012-09-09 ENCOUNTER — Encounter: Payer: Self-pay | Admitting: Family Medicine

## 2012-09-09 ENCOUNTER — Ambulatory Visit (INDEPENDENT_AMBULATORY_CARE_PROVIDER_SITE_OTHER): Payer: Medicare Other | Admitting: Family Medicine

## 2012-09-09 VITALS — BP 108/58 | HR 64 | Temp 97.6°F | Ht 67.0 in | Wt 192.0 lb

## 2012-09-09 DIAGNOSIS — K120 Recurrent oral aphthae: Secondary | ICD-10-CM

## 2012-09-09 DIAGNOSIS — Z0000 Encounter for general adult medical examination without abnormal findings: Secondary | ICD-10-CM

## 2012-09-09 DIAGNOSIS — R21 Rash and other nonspecific skin eruption: Secondary | ICD-10-CM

## 2012-09-09 DIAGNOSIS — I10 Essential (primary) hypertension: Secondary | ICD-10-CM

## 2012-09-09 DIAGNOSIS — E119 Type 2 diabetes mellitus without complications: Secondary | ICD-10-CM

## 2012-09-09 DIAGNOSIS — K219 Gastro-esophageal reflux disease without esophagitis: Secondary | ICD-10-CM

## 2012-09-09 DIAGNOSIS — E785 Hyperlipidemia, unspecified: Secondary | ICD-10-CM

## 2012-09-09 MED ORDER — TRIAMCINOLONE ACETONIDE 0.1 % EX CREA: TOPICAL_CREAM | Freq: Two times a day (BID) | CUTANEOUS | Status: DC

## 2012-09-09 MED ORDER — ESOMEPRAZOLE MAGNESIUM 40 MG PO CPDR: 40.0000 mg | DELAYED_RELEASE_CAPSULE | Freq: Every day | ORAL | Status: DC

## 2012-09-09 NOTE — Patient Instructions (Addendum)
Call about getting seen at the eye clinic.  Recheck A1c in March 2014 and then come see me after that.  Work on cutting out bread and potatoes in the meantime.   Use the cream as needed in the meantime for the itchy areas.

## 2012-09-09 NOTE — Progress Notes (Signed)
I have personally reviewed the Medicare Annual Wellness questionnaire and have noted 1. The patient's medical and social history 2. Their use of alcohol, tobacco or illicit drugs 3. Their current medications and supplements 4. The patient's functional ability including ADL's, fall risks, home safety risks and hearing or visual             impairment. 5. Diet and physical activities 6. Evidence for depression or mood disorders  The patients weight, height, BMI have been recorded in the chart and visual acuity is per eye clinic.  I have made referrals, counseling and provided education to the patient based review of the above and I have provided the pt with a written personalized care plan for preventive services.  Fall risk reviewed.  Occurred while fishing, on uneven ground.  D/w pt about balance exercises at home.  He agreed to work on them as instructed.    See scanned forms.  Routine anticipatory guidance given to patient.  See health maintenance. Tetanus 2012 Flu 2013 Shingles 2012 PNA 2011 Colonoscopy 2009 PSA wnl Advance directive, encouraged.  Wife is designated.    Diabetes:  No meds.  Hypoglycemic episodes: no symptoms Hyperglycemic episodes: no symptoms Feet problems: occ tingling in feet.   Blood Sugars averaging: ~120-140 eye exam within last year: encouraged f/u.   He needs to cut back on bread and potatoes.  Discussed.   Hypertension:    Using medication without problems or lightheadedness: yes Chest pain with exertion:no Edema:no Short of breath:no  Elevated Cholesterol: Using medications without problems: yes Muscle aches: no Diet compliance: discussed Exercise: encouraged to increase exercise.   Occ itchy skin on the back of his knees. Better with hydrocortisone, but then returns a few days later.    GERD controlled with prn nexium.  No ADE.  Doing well.   PMH and SH reviewed.   Vital signs, Meds and allergies reviewed.  ROS: See HPI.  Otherwise  nontributory.   GEN: nad, alert and oriented, overweight.  HEENT: mucous membranes moist, a few resolving aphthous blisters noted in mouth (present for about 8 days per patient, has h/o episodes in past that would self resolve) NECK: supple w/o LA CV: rrr PULM: ctab, no inc wob ABD: soft, +bs EXT: no edema SKIN: no acute rash except for some mildly excoriated skin on popliteal fossa x2  Diabetic foot exam: Normal inspection No skin breakdown No calluses  Normal DP pulses Normal sensation to light tough and monofilament Nails normal

## 2012-09-10 DIAGNOSIS — K120 Recurrent oral aphthae: Secondary | ICD-10-CM | POA: Insufficient documentation

## 2012-09-10 DIAGNOSIS — R21 Rash and other nonspecific skin eruption: Secondary | ICD-10-CM | POA: Insufficient documentation

## 2012-09-10 DIAGNOSIS — Z0000 Encounter for general adult medical examination without abnormal findings: Secondary | ICD-10-CM | POA: Insufficient documentation

## 2012-09-10 NOTE — Assessment & Plan Note (Signed)
No meds, needs to work on diet and lose weight.  Discussed.  Recheck A1c in a few months.  He understands the plan.  Labs discussed.

## 2012-09-10 NOTE — Assessment & Plan Note (Signed)
Controlled, continue current meds.  Needs to lose weight.  

## 2012-09-10 NOTE — Assessment & Plan Note (Signed)
See scanned forms.  Routine anticipatory guidance given to patient.  See health maintenance. Tetanus 2012 Flu 2013 Shingles 2012 PNA 2011 Colonoscopy 2009 PSA wnl Advance directive, encouraged.  Wife is designated.

## 2012-09-10 NOTE — Assessment & Plan Note (Signed)
Reassured.  A few resolving aphthous blisters noted in mouth (present for about 8 days per patient, has h/o episodes in past that would self resolve).  I see no lesions that are alarming.

## 2012-09-10 NOTE — Assessment & Plan Note (Signed)
Likely dry skin, worse in winter.  Would use topical TAC and f/u prn.  He agrees.

## 2012-09-10 NOTE — Assessment & Plan Note (Signed)
Controlled, continue current meds.  Needs to lose weight.

## 2012-10-17 ENCOUNTER — Telehealth: Payer: Self-pay

## 2012-10-17 DIAGNOSIS — I8390 Asymptomatic varicose veins of unspecified lower extremity: Secondary | ICD-10-CM

## 2012-10-17 NOTE — Telephone Encounter (Signed)
Left detailed message on home VM.

## 2012-10-17 NOTE — Telephone Encounter (Signed)
I would prefer to check him first.  We could check the leg/foot and get the imaging (u/s) if needed.   If doesn't want to do so and wants referral anyway, I would go to vasc surgery in [place].   Referral is in, please have Shirlee Limerick cancel if he is coming in first.

## 2012-10-17 NOTE — Telephone Encounter (Signed)
Pt request referral to vein specialist in [place]. A few yrs ago pt had blood clot in rt leg and saw a vein dr but does not know his name and did not like him so he wants to see someone else. Pt said rt ankle pain on and off for few weeks with swelling and redness of ankle during the day; swelling goes down while in bed at night. Ankle does not feel warm to touch;no SOB,dizziness or chest pain. Left ankle does not hurt or swell. Pt request to know name of doctor before appt is made.Please advise.

## 2012-10-20 ENCOUNTER — Ambulatory Visit (INDEPENDENT_AMBULATORY_CARE_PROVIDER_SITE_OTHER): Payer: Medicare Other | Admitting: Family Medicine

## 2012-10-20 ENCOUNTER — Encounter: Payer: Self-pay | Admitting: Family Medicine

## 2012-10-20 VITALS — BP 102/58 | HR 68 | Temp 97.5°F | Wt 196.8 lb

## 2012-10-20 DIAGNOSIS — I83893 Varicose veins of bilateral lower extremities with other complications: Secondary | ICD-10-CM

## 2012-10-20 NOTE — Progress Notes (Signed)
R leg pain.  H/o DVT in R lower leg.  He was prev in compression stockings.  H/o chronic DVT prev documented years ago on u/s per vascular clinic note.  Swelling during the day in R foot only, better at night.  No edema on L side.  No pain at night.  Some discomfort during the day.  No CP, no SOB.  He wears compression stocking some and it helps (knee high).  No recent injury.   Meds, vitals, and allergies reviewed.   ROS: See HPI.  Otherwise, noncontributory.  nad R lower leg and foot with varicose veins noted diffusely.  1+ edema in R foot and lower leg Normal DP pulse in foot No ulceration Mortise intact and foot not ttp over bony prominences

## 2012-10-20 NOTE — Telephone Encounter (Signed)
Patient made appt with Dr. Para March.

## 2012-10-20 NOTE — Assessment & Plan Note (Signed)
Refer to [facility] and continue compression stocking in meantime.  No need to acutely image and my understanding is that this can be done at the [facility] clinic (ie u/s).  Will defer to [facility].  App help for patient. I don't suspect acute DVT.

## 2012-10-20 NOTE — Patient Instructions (Addendum)
See Shirlee Limerick about your referral before you leave today about the ultrasound and the appointment.   Use the compression stocking in the meantime.

## 2012-10-21 ENCOUNTER — Other Ambulatory Visit: Payer: Self-pay | Admitting: *Deleted

## 2012-10-21 DIAGNOSIS — I83893 Varicose veins of bilateral lower extremities with other complications: Secondary | ICD-10-CM

## 2012-11-05 ENCOUNTER — Encounter: Payer: Medicare Other | Admitting: Vascular Surgery

## 2012-11-07 ENCOUNTER — Encounter: Payer: Self-pay | Admitting: Surgery

## 2012-11-10 ENCOUNTER — Encounter (INDEPENDENT_AMBULATORY_CARE_PROVIDER_SITE_OTHER): Payer: Medicare Other | Admitting: *Deleted

## 2012-11-10 ENCOUNTER — Encounter: Payer: Self-pay | Admitting: Surgery

## 2012-11-10 ENCOUNTER — Ambulatory Visit (INDEPENDENT_AMBULATORY_CARE_PROVIDER_SITE_OTHER): Payer: Medicare Other | Admitting: Surgery

## 2012-11-10 VITALS — BP 130/68 | HR 67 | Ht 67.0 in | Wt 190.5 lb

## 2012-11-10 DIAGNOSIS — I83893 Varicose veins of bilateral lower extremities with other complications: Secondary | ICD-10-CM

## 2012-11-10 DIAGNOSIS — M7989 Other specified soft tissue disorders: Secondary | ICD-10-CM

## 2012-11-10 NOTE — Progress Notes (Signed)
[facility]   Patient name: Ronnie Johnson MRN: [id number] DOB: Dec 29, 1940 Sex: male   Referred by: Dr.Duncan  Reason for referral:  Chief Complaint  Patient presents with  . Varicose Veins    New pt, ref from [place] Dr. Crawford Givens - pt c/o leg pain, swelling R leg    HISTORY OF PRESENT ILLNESS: A very pleasant 72 year old gentleman who is referred for problems with his right leg. The patient states that 4 years ago he developed a spontaneous right leg deep vein thrombosis, of unknown etiology. He was treated with Coumadin. This has been discontinued for approximately 1-1/2 years. He wears compression stockings that R. knee high. He complains of swelling in his legs which is exacerbated by activity and standing for prolonged periods of time. He does get pain around the varicosities in his ankle and calf.  The patient suffers from coronary artery disease. He has a history of a myocardial infarction. He did not undergo stenting or surgery. His cholesterol is medically managed with a statin. His blood pressure is under good control with an ACE inhibitor. He has been diagnosed as a prediabetic. He has a history of tobacco use in the past but quit in 1978  Past Medical History  Diagnosis Date  . Hypertension   . DVT of leg (deep venous thrombosis) 2009  . Nephrolithiasis   . Hyperlipidemia   . Allergic rhinitis   . CAD (coronary artery disease)   . Urethral diverticulum   . Varicose vein of leg   . Dupuytren's contracture   . ED (erectile dysfunction)   . History of colonic polyps   . GERD (gastroesophageal reflux disease)   . Diverticulosis of colon   . Diabetes mellitus, type 2   . Myocardial infarction   . SCC (squamous cell carcinoma)     scalp 2013 per derm  . DVT (deep venous thrombosis)     Past Surgical History  Procedure Date  . Appendectomy 1994  . Umbilical hernia repair 03/14/2005  . Mohs surgery 06/18/2007    Left ear basal cell     History   Social History  . Marital Status: Married    Spouse Name: N/A    Number of Children: 2  . Years of Education: N/A   Occupational History  . RETIRED     Chiropodist   Social History Main Topics  . Smoking status: Former Smoker    Quit date: 10/08/1988  . Smokeless tobacco: Never Used  . Alcohol Use: No     Comment: RARE  . Drug Use: No  . Sexually Active: Not on file   Other Topics Concern  . Not on file   Social History Narrative   Marital Status: Married 1976, second timeChildren: 2 CHILDREN, 1 step daughter Occupation: RETIRED IT consultant Use - Former. -- 1990Alcohol Use - yes - rarelyenjoys fishing    Family History  Problem Relation Age of Onset  . Diabetes Mother   . Hypertension Mother   . Stroke Mother   . Alzheimer's disease Mother   . Heart attack Father   . Hypertension Father   . Prostate cancer Brother   . Diabetes Brother   . Hypertension Brother   . Diabetes Sister   . Colon cancer Neg Hx   . Hypertension Daughter     Allergies as of 11/10/2012 - Review Complete 11/10/2012  Allergen Reaction Noted  . Latex  05/26/2009  . Lipitor (atorvastatin calcium)  12/31/2011    Current Outpatient Prescriptions on File Prior to Visit  Medication Sig Dispense Refill  . Ascorbic Acid (VITAMIN C) 500 MG tablet Take 500 mg by mouth daily.        Marland Kitchen aspirin 81 MG tablet Take 81 mg by mouth daily.        . carvedilol (COREG) 6.25 MG tablet Take 6.25 mg by mouth 2 (two) times daily with meals.        . cholecalciferol (VITAMIN D) 1000 UNITS tablet Take 1,000 Units by mouth daily.        . Coenzyme Q10 200 MG TABS Take 1 tablet (200 mg total) by mouth daily.      Marland Kitchen esomeprazole (NEXIUM) 40 MG capsule Take 1 capsule (40 mg total) by mouth daily.  90 capsule  3  . lisinopril (PRINIVIL,ZESTRIL) 10 MG tablet Take 1/2 tablet by mouth each day.      . loratadine (CLARITIN) 10 MG tablet Take 10 mg by mouth daily.         . rosuvastatin (CRESTOR) 20 MG tablet Take 1 tablet (20 mg total) by mouth at bedtime.  90 tablet  2  . triamcinolone cream (KENALOG) 0.1 % Apply topically 2 (two) times daily.  30 g  3     REVIEW OF SYSTEMS: Cardiovascular: Positive for pain in his legs when walking and lying flat. Positive for swelling in his right leg and positive for a DVT in his right leg  Pulmonary: No productive cough, asthma or wheezing. Neurologic: Positive for weakness in his right leg No dizziness. Hematologic: No bleeding problems or clotting disorders. Musculoskeletal: No joint pain or joint swelling. Gastrointestinal: No blood in stool or hematemesis Genitourinary: No dysuria or hematuria. Psychiatric:: No history of major depression. Integumentary: No rashes or ulcers. Constitutional: No fever or chills.  PHYSICAL EXAMINATION: General: The patient appears their stated age.  Vital signs are BP 130/68  Pulse 67  Ht 5\' 7"  (1.702 m)  Wt 190 lb 8 oz (86.41 kg)  BMI 29.84 kg/m2  SpO2 93% HEENT:  No gross abnormalities Pulmonary: Respirations are non-labored Abdomen: Soft and non-tender  Musculoskeletal: There are no major deformities.   Neurologic: No focal weakness or paresthesias are detected, Skin: There are no ulcer or rashes noted. Brawny changes are noted to the right gaiter region. Psychiatric: The patient has normal affect. Cardiovascular: There is a regular rate and rhythm without significant murmur appreciated. Palpable pedal pulses bilaterally. No carotid bruits. Multiple her calyces are present on the medial and posterior side of the right calf with skin color changes. No open ulcers  Diagnostic Studies: Duplex has been ordered and reviewed. He has both deep and superficial venous reflux. He has a large branch of the greater saphenous vein on the right leg with diameter measurements of greater than 0.57 cm. There is also reflux in the lesser saphenous vein. There appears to be a vein of  Giacomini which is the source of reflux. Of note, arterial tracings were obtained within the popliteal vein and the knee    Medication Changes: The patient was given a prescription for 20-30 mm compression thigh-high stockings  Assessment:  Venous insufficiency, right leg with skin changes, CEAP class IV Plan: The patient has reflux in both the deep and superficial system. He has skin changes as well as painful varicosities. I feel that he could potentially be a candidate for a laser ablation of the saphenous vein branch which had significant reflux and is very  dilated. This looks as if it is a vein of Giacomini. I did tell the patient that because he has reflux involving both the superficial and deep system, that ablation of the saphenous system would likely not completely cure him of his symptoms and he will require wearing compression stockings indefinitely to prevent worsening skin changes and possibly ulceration. I have given him a prescription for 20-30 mm compression thigh-high stockings. He will followup to see Dr. Hart Rochester to discuss possible intervention in 3 months.  An interesting finding on his ultrasound today was arterial flow within the popliteal vein behind the knee. There was no evidence of an arteriovenous fistula, but certainly this is something that should be noted in the future. He could potentially need to undergo a CT angiography versus angiography to determine if this is actually a fistula which could be contributing to the swelling in his right leg.     Juleen China IV, M.D. [facility] [place] Office: [phone] Pager:  [phone]

## 2012-12-10 ENCOUNTER — Other Ambulatory Visit (INDEPENDENT_AMBULATORY_CARE_PROVIDER_SITE_OTHER): Payer: Medicare Other

## 2012-12-10 ENCOUNTER — Other Ambulatory Visit: Payer: Self-pay | Admitting: *Deleted

## 2012-12-10 DIAGNOSIS — E119 Type 2 diabetes mellitus without complications: Secondary | ICD-10-CM

## 2012-12-10 LAB — HEMOGLOBIN A1C: Hgb A1c MFr Bld: 7.8 % — ABNORMAL HIGH (ref 4.6–6.5)

## 2012-12-10 MED ORDER — ROSUVASTATIN CALCIUM 20 MG PO TABS: 20.0000 mg | ORAL_TABLET | Freq: Every day | ORAL | Status: DC

## 2012-12-11 ENCOUNTER — Telehealth: Payer: Self-pay

## 2012-12-11 NOTE — Telephone Encounter (Signed)
Mrs Ronnie Johnson left v/m with question about Crestor refill. Advised Mrs Ronnie Johnson needed to contact [facility]. pts wife voiced understanding.

## 2012-12-15 ENCOUNTER — Ambulatory Visit (INDEPENDENT_AMBULATORY_CARE_PROVIDER_SITE_OTHER): Payer: Medicare Other | Admitting: Family Medicine

## 2012-12-15 ENCOUNTER — Encounter: Payer: Self-pay | Admitting: Family Medicine

## 2012-12-15 VITALS — BP 122/68 | HR 96 | Temp 97.7°F | Wt 197.8 lb

## 2012-12-15 DIAGNOSIS — I83893 Varicose veins of bilateral lower extremities with other complications: Secondary | ICD-10-CM

## 2012-12-15 DIAGNOSIS — E119 Type 2 diabetes mellitus without complications: Secondary | ICD-10-CM

## 2012-12-15 DIAGNOSIS — K219 Gastro-esophageal reflux disease without esophagitis: Secondary | ICD-10-CM

## 2012-12-15 DIAGNOSIS — I10 Essential (primary) hypertension: Secondary | ICD-10-CM

## 2012-12-15 MED ORDER — ESOMEPRAZOLE MAGNESIUM 40 MG PO CPDR: 40.0000 mg | DELAYED_RELEASE_CAPSULE | Freq: Every day | ORAL | Status: DC

## 2012-12-15 MED ORDER — ROSUVASTATIN CALCIUM 20 MG PO TABS: 20.0000 mg | ORAL_TABLET | Freq: Every day | ORAL | Status: DC

## 2012-12-15 MED ORDER — CARVEDILOL 6.25 MG PO TABS: 6.2500 mg | ORAL_TABLET | Freq: Two times a day (BID) | ORAL | Status: DC

## 2012-12-15 NOTE — Progress Notes (Signed)
Leg edema.  He had been wearing compressing stockings with less pain and swelling.  He has planned f/u with vascular pending. No skin breakdown.    Diabetes:  No meds Hypoglycemic episodes: no Hyperglycemic episodes: no Feet problems: no He's been back in the gym recently.  Diet discussed.  Minimal snacking; d/w pt about low carb adherence.  Blood Sugars averaging: occ checked, ~140 fasting A1c up to 7.8.    Hypertension:    Using medication without problems or lightheadedness: yes Chest pain with exertion:no Edema: controlled as above Short of breath:no  GERD sx controlled with nexium.  Prev was on prilosec. Sx currently controlled. No vomiting, minimal heartburn.    PMH and SH reviewed.   Vital signs, Meds and allergies reviewed.  ROS: See HPI.  Otherwise nontributory.   GEN: nad, alert and oriented HEENT: mucous membranes moist NECK: supple w/o LA CV: rrr.  PULM: ctab, no inc wob ABD: soft, +bs EXT: trace edema SKIN: no acute rash  Diabetic foot exam: Varicose/spider veins noted No skin breakdown No calluses  Normal DP pulses Normal sensation to light tough and monofilament Dry skin noted

## 2012-12-15 NOTE — Patient Instructions (Signed)
Recheck A1c before a visit in about 4 months.  Take care.  Glad to see you.   Keep working on your diet and keep going to the gym.

## 2012-12-15 NOTE — Assessment & Plan Note (Signed)
Controlled, continue as is.  D/w pt about diet, weight and exercise.  

## 2012-12-15 NOTE — Assessment & Plan Note (Signed)
Elevated, we elected not to start meds today.  Continue as is for now.  He's recently back in the gym.  D/w pt about diet, weight and exercise.

## 2012-12-15 NOTE — Assessment & Plan Note (Signed)
Improved sx with stockings and has f/u with vascular pending.

## 2012-12-15 NOTE — Assessment & Plan Note (Signed)
Controlled, continue as is.  D/w pt about diet, weight and exercise.

## 2013-01-15 ENCOUNTER — Ambulatory Visit: Payer: Medicare Other | Admitting: Cardiology

## 2013-01-23 ENCOUNTER — Encounter: Payer: Self-pay | Admitting: Cardiovascular Disease

## 2013-01-23 ENCOUNTER — Ambulatory Visit (INDEPENDENT_AMBULATORY_CARE_PROVIDER_SITE_OTHER): Payer: Medicare Other | Admitting: Cardiovascular Disease

## 2013-01-23 VITALS — BP 120/80 | HR 67 | Ht 67.0 in | Wt 198.5 lb

## 2013-01-23 DIAGNOSIS — E785 Hyperlipidemia, unspecified: Secondary | ICD-10-CM

## 2013-01-23 DIAGNOSIS — I10 Essential (primary) hypertension: Secondary | ICD-10-CM

## 2013-01-23 DIAGNOSIS — Z01818 Encounter for other preprocedural examination: Secondary | ICD-10-CM

## 2013-01-23 DIAGNOSIS — Z01810 Encounter for preprocedural cardiovascular examination: Secondary | ICD-10-CM

## 2013-01-23 DIAGNOSIS — I83893 Varicose veins of bilateral lower extremities with other complications: Secondary | ICD-10-CM

## 2013-01-23 DIAGNOSIS — R0602 Shortness of breath: Secondary | ICD-10-CM

## 2013-01-23 DIAGNOSIS — I2510 Atherosclerotic heart disease of native coronary artery without angina pectoris: Secondary | ICD-10-CM

## 2013-01-23 DIAGNOSIS — E119 Type 2 diabetes mellitus without complications: Secondary | ICD-10-CM

## 2013-01-23 NOTE — Assessment & Plan Note (Signed)
He does have some discomfort in his right lower extremity, prior DVT location. He does get an ache when his legs swell and is considering vein surgery.

## 2013-01-23 NOTE — Assessment & Plan Note (Signed)
He would be acceptable risk for upcoming rotator cuff surgery on the left. No further testing needed.

## 2013-01-23 NOTE — Progress Notes (Signed)
Patient ID: Ronnie Johnson, male    DOB: 04-Jan-1941, 72 y.o.   MRN: [id number]  HPI Comments: 72 yo with history of CAD s/p MI in April 2010 and DVT right leg in January 2009, hypertension, hyperlipidemia,presents for followup.  He reports that he is trying to schedule surgery for left rotator cuff. He denies any recent chest pain. Weight has been trending upward as he is been less active. He is tolerating Crestor 20 mg daily with no significant muscle ache. He's not been exercising at the gym as he use to. Hemoglobin A1c greater than 8.  He had an MI  April 2010.  The patient did not have a heart catheterization at that time.  Myoview showing a large lateral defect from a base to apex, that was mainly fixed with some peri-infarct ischemia. EF was 42%.    left heart catheterization in May 2010, that showed EF of 55%, left ventricular end-diastolic pressure of 21 mmHg, and on coronary angiography, there were separate ostia to the LAD and the circumflex. There was a 30-40% ostial LAD stenosis and 40% first diagonal stenosis. The first obtuse marginal was totally occluded. There was a 75% stenosis in the AV circumflex after the first obtuse marginal. There was a 70% ostial stenosis in the second obtuse marginal. There was an 80% stenosis in the circumflex after the second obtuse marginal (the circumflex was a small vessel at that point). It was decided to manage the patient medically.    EKG shows normal sinus rhythm with rate 67 beats per minute, no significant ST or T wave changes      Outpatient Encounter Prescriptions as of 01/23/2013  Medication Sig Dispense Refill  . Ascorbic Acid (VITAMIN C) 500 MG tablet Take 500 mg by mouth daily.        Marland Kitchen aspirin 81 MG tablet Take 81 mg by mouth daily.        . carvedilol (COREG) 6.25 MG tablet Take 1 tablet (6.25 mg total) by mouth 2 (two) times daily with a meal.  180 tablet  3  . cholecalciferol (VITAMIN D) 1000 UNITS tablet Take 1,000 Units by mouth  daily.        . Coenzyme Q10 200 MG TABS Take 1 tablet (200 mg total) by mouth daily.      Marland Kitchen esomeprazole (NEXIUM) 40 MG capsule Take 1 capsule (40 mg total) by mouth daily.  90 capsule  3  . lisinopril (PRINIVIL,ZESTRIL) 10 MG tablet Take 1/2 tablet by mouth each day.      . loratadine (CLARITIN) 10 MG tablet Take 10 mg by mouth daily.        . rosuvastatin (CRESTOR) 20 MG tablet Take 1 tablet (20 mg total) by mouth at bedtime.  90 tablet  3  . triamcinolone cream (KENALOG) 0.1 % Apply topically 2 (two) times daily.  30 g  3   No facility-administered encounter medications on file as of 01/23/2013.    Review of Systems  Constitutional: Negative.   HENT: Negative.   Eyes: Negative.   Respiratory: Negative.   Cardiovascular: Negative.   Gastrointestinal: Negative.   Musculoskeletal: Positive for myalgias and arthralgias.  Skin: Negative.   Neurological: Negative.   Psychiatric/Behavioral: Negative.   All other systems reviewed and are negative.    BP 120/80  Pulse 67  Ht 5\' 7"  (1.702 m)  Wt 198 lb 8 oz (90.039 kg)  BMI 31.08 kg/m2  Physical Exam  Nursing note and vitals reviewed. Constitutional:  He is oriented to person, place, and time. He appears well-developed and well-nourished.  obese  HENT:  Head: Normocephalic.  Nose: Nose normal.  Mouth/Throat: Oropharynx is clear and moist.  Eyes: Conjunctivae are normal. Pupils are equal, round, and reactive to light.  Neck: Normal range of motion. Neck supple. No JVD present.  Cardiovascular: Normal rate, regular rhythm, S1 normal, S2 normal, normal heart sounds and intact distal pulses.  Exam reveals no gallop and no friction rub.   No murmur heard. Pulmonary/Chest: Effort normal and breath sounds normal. No respiratory distress. He has no wheezes. He has no rales. He exhibits no tenderness.  Abdominal: Soft. Bowel sounds are normal. He exhibits no distension. There is no tenderness.  Musculoskeletal: Normal range of motion. He  exhibits no edema and no tenderness.  Lymphadenopathy:    He has no cervical adenopathy.  Neurological: He is alert and oriented to person, place, and time. Coordination normal.  Skin: Skin is warm and dry. No rash noted. No erythema.  Psychiatric: He has a normal mood and affect. His behavior is normal. Judgment and thought content normal.      Assessment and Plan

## 2013-01-23 NOTE — Assessment & Plan Note (Signed)
We have stressed to him the importance of good diabetes control and weight loss.

## 2013-01-23 NOTE — Assessment & Plan Note (Signed)
Cholesterol is at goal on the current lipid regimen. No changes to the medications were made.  

## 2013-01-23 NOTE — Assessment & Plan Note (Signed)
Currently with no symptoms of angina. No further workup at this time. Continue current medication regimen. 

## 2013-01-23 NOTE — Patient Instructions (Addendum)
You are doing well. No medication changes were made.  Please call us if you have new issues that need to be addressed before your next appt.  Your physician wants you to follow-up in: 12 months.  You will receive a reminder letter in the mail two months in advance. If you don't receive a letter, please call our office to schedule the follow-up appointment. 

## 2013-01-23 NOTE — Assessment & Plan Note (Signed)
Blood pressure is well controlled on today's visit. No changes made to the medications. 

## 2013-02-09 ENCOUNTER — Encounter: Payer: Self-pay | Admitting: Vascular Surgery

## 2013-02-10 ENCOUNTER — Ambulatory Visit (INDEPENDENT_AMBULATORY_CARE_PROVIDER_SITE_OTHER): Payer: Medicare Other | Admitting: Vascular Surgery

## 2013-02-10 ENCOUNTER — Encounter: Payer: Self-pay | Admitting: Vascular Surgery

## 2013-02-10 VITALS — BP 153/76 | HR 66 | Resp 16 | Ht 67.0 in | Wt 190.0 lb

## 2013-02-10 DIAGNOSIS — I83893 Varicose veins of bilateral lower extremities with other complications: Secondary | ICD-10-CM

## 2013-02-10 NOTE — Progress Notes (Signed)
Subjective:     Patient ID: Ronnie Johnson, male   DOB: 01-31-1941, 72 y.o.   MRN: [id number]  HPI this 72 year old male returns for further discussion regarding his severe varicose veins and venous insufficiency of the right leg. He continues to have aching throbbing and burning discomfort as well as distal edema. He has a history of DVT in the deep system has recanalized but has some insufficiency. He has gross reflux in the right great saphenous vein supplying bulging varicosities in the calf. He continues to have symptoms despite conservative management. He also has significant skin changes.  Past Medical History  Diagnosis Date  . Hypertension   . DVT of leg (deep venous thrombosis) 2009  . Nephrolithiasis   . Hyperlipidemia   . Allergic rhinitis   . CAD (coronary artery disease)   . Urethral diverticulum   . Varicose vein of leg   . Dupuytren's contracture   . ED (erectile dysfunction)   . History of colonic polyps   . GERD (gastroesophageal reflux disease)   . Diverticulosis of colon   . Diabetes mellitus, type 2   . Myocardial infarction   . SCC (squamous cell carcinoma)     scalp 2013 per derm  . DVT (deep venous thrombosis)     History  Substance Use Topics  . Smoking status: Former Smoker    Quit date: 10/08/1988  . Smokeless tobacco: Never Used  . Alcohol Use: No     Comment: RARE    Family History  Problem Relation Age of Onset  . Diabetes Mother   . Hypertension Mother   . Stroke Mother   . Alzheimer's disease Mother   . Heart attack Father   . Hypertension Father   . Prostate cancer Brother   . Diabetes Brother   . Hypertension Brother   . Diabetes Sister   . Colon cancer Neg Hx   . Hypertension Daughter     Allergies  Allergen Reactions  . Latex   . Lipitor (Atorvastatin Calcium)     Tolerates crestor    Current outpatient prescriptions:Ascorbic Acid (VITAMIN C) 500 MG tablet, Take 500 mg by mouth daily.  , Disp: , Rfl: ;  aspirin 81 MG  tablet, Take 81 mg by mouth daily.  , Disp: , Rfl: ;  carvedilol (COREG) 6.25 MG tablet, Take 1 tablet (6.25 mg total) by mouth 2 (two) times daily with a meal., Disp: 180 tablet, Rfl: 3;  cholecalciferol (VITAMIN D) 1000 UNITS tablet, Take 1,000 Units by mouth daily.  , Disp: , Rfl:  Coenzyme Q10 200 MG TABS, Take 1 tablet (200 mg total) by mouth daily., Disp: , Rfl: ;  esomeprazole (NEXIUM) 40 MG capsule, Take 1 capsule (40 mg total) by mouth daily., Disp: 90 capsule, Rfl: 3;  lisinopril (PRINIVIL,ZESTRIL) 10 MG tablet, Take 1/2 tablet by mouth each day., Disp: , Rfl: ;  loratadine (CLARITIN) 10 MG tablet, Take 10 mg by mouth daily.  , Disp: , Rfl:  rosuvastatin (CRESTOR) 20 MG tablet, Take 1 tablet (20 mg total) by mouth at bedtime., Disp: 90 tablet, Rfl: 3;  triamcinolone cream (KENALOG) 0.1 %, Apply topically 2 (two) times daily., Disp: 30 g, Rfl: 3  BP 153/76  Pulse 66  Resp 16  Ht 5\' 7"  (1.702 m)  Wt 190 lb (86.183 kg)  BMI 29.75 kg/m2  Body mass index is 29.75 kg/(m^2).           Review of Systems denies chest pain, dyspnea on  exertion, PND, orthopnea, hemoptysis    Objective:   Physical Exam blood pressure 150/76 heart rate 66 respirations 16 General well-developed well-nourished male in no apparent stress alert and oriented x3 Right lower extremity with bulging varicosities in the posterior calf and medial distal thigh. He has severe hyperpigmentation lower third of the right leg with no active ulcer. 1+ chronic edema. 3+ dorsalis pedis pulse palpable.    Assessment:     Severe venous insufficiency right leg with reflux and superficial and deep system. No DVT. Gross reflux right great saphenous vein with severe skin changes and no active ulceration and chronic edema and pain    Plan:     Symptoms have not been relieved by conservative measures. Patient needs laser ablation right great saphenous vein and to return in 3 months to see if stab phlebectomy of right secondary  varicosities is indicated We'll proceed with precertification to perform this in the near future

## 2013-02-20 ENCOUNTER — Encounter: Payer: Self-pay | Admitting: Surgery

## 2013-02-23 ENCOUNTER — Ambulatory Visit (INDEPENDENT_AMBULATORY_CARE_PROVIDER_SITE_OTHER): Payer: Medicare Other | Admitting: Vascular Surgery

## 2013-02-23 ENCOUNTER — Encounter: Payer: Self-pay | Admitting: Vascular Surgery

## 2013-02-23 ENCOUNTER — Encounter: Payer: Self-pay | Admitting: Family Medicine

## 2013-02-23 ENCOUNTER — Ambulatory Visit (INDEPENDENT_AMBULATORY_CARE_PROVIDER_SITE_OTHER): Payer: Medicare Other | Admitting: Family Medicine

## 2013-02-23 VITALS — BP 143/87 | HR 80 | Resp 16 | Ht 67.0 in | Wt 191.0 lb

## 2013-02-23 VITALS — BP 132/58 | HR 58 | Temp 97.7°F | Wt 191.2 lb

## 2013-02-23 DIAGNOSIS — I83893 Varicose veins of bilateral lower extremities with other complications: Secondary | ICD-10-CM

## 2013-02-23 DIAGNOSIS — R197 Diarrhea, unspecified: Secondary | ICD-10-CM

## 2013-02-23 NOTE — Progress Notes (Signed)
Laser Ablation Procedure      Date: 02/23/2013    Ronnie Johnson DOB:[birth year]  Consent signed: Yes  Surgeon:J.D. Hart [place]  Procedure: Laser Ablation: right Greater Saphenous Vein  BP 143/87  Pulse 80  Resp 16  Ht 5\' 7"  (1.702 m)  Wt 191 lb (86.637 kg)  BMI 29.91 kg/m2  Start time: 3:00   End time: 3:35  Tumescent Anesthesia: 250 cc 0.9% NaCl with 50 cc Lidocaine HCL with 1% Epi and 15 cc 8.4% NaHCO3  Local Anesthesia: 3 cc Lidocaine HCL and NaHCO3 (ratio 2:1)  Pulsed mode:yes Watts 15 Seconds 1 Pulses:1 Total Pulses:150 Total Energy: 2247 Total Time: 2:29     Patient tolerated procedure well: Yes  Notes:   Description of Procedure:  After marking the course of the saphenous vein and the secondary varicosities in the standing position, the patient was placed on the operating table in the supine position, and the right leg was prepped and draped in sterile fashion. Local anesthetic was administered, and under ultrasound guidance the saphenous vein was accessed with a micro needle and guide wire; then the micro puncture sheath was placed. A guide wire was inserted to the saphenofemoral junction, followed by a 5 french sheath.  The position of the sheath and then the laser fiber below the junction was confirmed using the ultrasound and visualization of the aiming beam.  Tumescent anesthesia was administered along the course of the saphenous vein using ultrasound guidance. Protective laser glasses were placed on the patient, and the laser was fired at 15 watt pulsed mode advancing 1-2 mm per sec.  For a total of 2247 joules.  A steri strip was applied to the puncture site.    ABD pads and thigh high compression stockings were applied.  Ace wrap bandages were applied over the phlebectomy sites and at the top of the saphenofemoral junction.  Blood loss was less than 15 cc.  The patient ambulated out of the operating room having tolerated the procedure well.

## 2013-02-23 NOTE — Patient Instructions (Addendum)
Wash your hands, avoid dairy and drink plenty of fluids.  This should gradually improve.  Take care.

## 2013-02-23 NOTE — Progress Notes (Signed)
Subjective:     Patient ID: Ronnie Johnson, male   DOB: 02/18/1941, 72 y.o.   MRN: [id number]  HPI this 72 year old male had laser ablation right great saphenous vein performed under local tumescent anesthesia for venous hypertension with severe skin changes. A total of 2300 J of energy was utilized and he tolerated the procedure well.  Review of Systems     Objective:   Physical Exam BP 143/87  Pulse 80  Resp 16  Ht 5\' 7"  (1.702 m)  Wt 191 lb (86.637 kg)  BMI 29.91 kg/m2        Assessment:      Well-tolerated laser ablation right great saphenous vein performed under local tumescent anesthesia for venous hypertension with severe skin changes     Plan:     Return in 2 weeks for a venous duplex exam right great saphenous vein to confirm closure. Patient will then return in 3 months to see if stab phlebectomy of secondary varicosities will be necessary

## 2013-02-23 NOTE — Progress Notes (Signed)
Diarrhea for about 6-7 days.  Was down at [employer] for grandkid's graduation.  Took some pepto bismol with some relief.  No vomiting but he has some nausea.  He thought he was getting better until this past weekend when it returned (after eating dairy).  No blood in stool.  No BMs since this AM.  BMs were "straight water" in the beginning but have gotten thicker in the meantime.  No fevers.  No aches.  He did have some chills. No exotic foods. Sick contact noted.  Sx started during the trip.  He had some cramping prev before the BMs.  He prev had audible stomach gurgling but this is improved.   He was schedule for vein intervention today.  Discussed.    Meds, vitals, and allergies reviewed.   ROS: See HPI.  Otherwise, noncontributory.  nad ncat Mmm rrr ctab abd soft, not ttp except for minimally ttp in LLQ but this is improved from prev.  No rebound.  Normal BS Ext w/o edema Normal cap perfusion in ext

## 2013-02-23 NOTE — Assessment & Plan Note (Signed)
Likely resolving viral process, prevalent in community recently.  Likely exacerbated by dairy over the weekend.  BRAT diet, fluids, handwashing and f/u prn.  Nontoxic.  D/w pt and he agrees.

## 2013-02-24 ENCOUNTER — Other Ambulatory Visit: Payer: Self-pay | Admitting: *Deleted

## 2013-02-24 ENCOUNTER — Telehealth: Payer: Self-pay | Admitting: *Deleted

## 2013-02-24 DIAGNOSIS — I83893 Varicose veins of bilateral lower extremities with other complications: Secondary | ICD-10-CM

## 2013-02-24 NOTE — Telephone Encounter (Signed)
Pt doing well. Slight discomfort. Following all instructions. Reviewed them. Reminded him of his fu appt.

## 2013-03-03 ENCOUNTER — Ambulatory Visit: Payer: Medicare Other | Admitting: Vascular Surgery

## 2013-03-09 ENCOUNTER — Encounter: Payer: Self-pay | Admitting: Vascular Surgery

## 2013-03-10 ENCOUNTER — Telehealth: Payer: Self-pay

## 2013-03-10 ENCOUNTER — Encounter: Payer: Self-pay | Admitting: Vascular Surgery

## 2013-03-10 ENCOUNTER — Ambulatory Visit (INDEPENDENT_AMBULATORY_CARE_PROVIDER_SITE_OTHER): Payer: Medicare Other | Admitting: Vascular Surgery

## 2013-03-10 ENCOUNTER — Encounter (INDEPENDENT_AMBULATORY_CARE_PROVIDER_SITE_OTHER): Payer: Medicare Other | Admitting: Vascular Surgery

## 2013-03-10 VITALS — BP 168/77 | HR 56 | Resp 16 | Ht 67.0 in | Wt 190.0 lb

## 2013-03-10 DIAGNOSIS — I83893 Varicose veins of bilateral lower extremities with other complications: Secondary | ICD-10-CM

## 2013-03-10 DIAGNOSIS — M7989 Other specified soft tissue disorders: Secondary | ICD-10-CM

## 2013-03-10 DIAGNOSIS — M79609 Pain in unspecified limb: Secondary | ICD-10-CM

## 2013-03-10 DIAGNOSIS — Z48812 Encounter for surgical aftercare following surgery on the circulatory system: Secondary | ICD-10-CM

## 2013-03-10 DIAGNOSIS — Z86718 Personal history of other venous thrombosis and embolism: Secondary | ICD-10-CM

## 2013-03-10 DIAGNOSIS — I82409 Acute embolism and thrombosis of unspecified deep veins of unspecified lower extremity: Secondary | ICD-10-CM

## 2013-03-10 DIAGNOSIS — I82401 Acute embolism and thrombosis of unspecified deep veins of right lower extremity: Secondary | ICD-10-CM

## 2013-03-10 MED ORDER — WARFARIN SODIUM 5 MG PO TABS: 5.0000 mg | ORAL_TABLET | Freq: Every day | ORAL | Status: DC

## 2013-03-10 NOTE — Progress Notes (Signed)
Subjective:     Patient ID: Ronnie Johnson, male   DOB: Apr 25, 1941, 72 y.o.   MRN: [id number]  HPI this 72 year old male is one week post laser ablation right great saphenous vein from the knee to the saphenofemoral junction. He had a history of a partial DVT in the popliteal and superficial femoral vein. He has had mild to moderate discomfort along the course of the laser ablation but no change in distal edema. He has been wearing elastic compression stocking and taking ibuprofen as instructed. He has not been on Coumadin for 2 years.  Past Medical History  Diagnosis Date  . Hypertension   . DVT of leg (deep venous thrombosis) 2009  . Nephrolithiasis   . Hyperlipidemia   . Allergic rhinitis   . CAD (coronary artery disease)   . Urethral diverticulum   . Varicose vein of leg   . Dupuytren's contracture   . ED (erectile dysfunction)   . History of colonic polyps   . GERD (gastroesophageal reflux disease)   . Diverticulosis of colon   . Diabetes mellitus, type 2   . Myocardial infarction   . SCC (squamous cell carcinoma)     scalp 2013 per derm  . DVT (deep venous thrombosis)     History  Substance Use Topics  . Smoking status: Former Smoker    Quit date: 10/08/1988  . Smokeless tobacco: Never Used  . Alcohol Use: No     Comment: RARE    Family History  Problem Relation Age of Onset  . Diabetes Mother   . Hypertension Mother   . Stroke Mother   . Alzheimer's disease Mother   . Heart attack Father   . Hypertension Father   . Prostate cancer Brother   . Diabetes Brother   . Hypertension Brother   . Diabetes Sister   . Colon cancer Neg Hx   . Hypertension Daughter     Allergies  Allergen Reactions  . Latex   . Lipitor (Atorvastatin Calcium)     Tolerates crestor    Current outpatient prescriptions:Ascorbic Acid (VITAMIN C) 500 MG tablet, Take 500 mg by mouth daily.  , Disp: , Rfl: ;  aspirin 81 MG tablet, Take 81 mg by mouth daily.  , Disp: , Rfl: ;  carvedilol  (COREG) 6.25 MG tablet, Take 1 tablet (6.25 mg total) by mouth 2 (two) times daily with a meal., Disp: 180 tablet, Rfl: 3;  cholecalciferol (VITAMIN D) 1000 UNITS tablet, Take 1,000 Units by mouth daily.  , Disp: , Rfl:  Coenzyme Q10 200 MG TABS, Take 1 tablet (200 mg total) by mouth daily., Disp: , Rfl: ;  esomeprazole (NEXIUM) 40 MG capsule, Take 1 capsule (40 mg total) by mouth daily., Disp: 90 capsule, Rfl: 3;  lisinopril (PRINIVIL,ZESTRIL) 10 MG tablet, Take 1/2 tablet by mouth each day., Disp: , Rfl: ;  loratadine (CLARITIN) 10 MG tablet, Take 10 mg by mouth daily.  , Disp: , Rfl:  rosuvastatin (CRESTOR) 20 MG tablet, Take 1 tablet (20 mg total) by mouth at bedtime., Disp: 90 tablet, Rfl: 3;  triamcinolone cream (KENALOG) 0.1 %, Apply topically 2 (two) times daily., Disp: 30 g, Rfl: 3  BP 168/77  Pulse 56  Resp 16  Ht 5\' 7"  (1.702 m)  Wt 190 lb (86.183 kg)  BMI 29.75 kg/m2  Body mass index is 29.75 kg/(m^2).           Review of Systems denies chest pain, dyspnea on exertion, PND, orthopnea,  hemoptysis.    Objective:   Physical Exam blood pressure 168/77 heart rate 86 respirations 16 General well-developed well-nourished male in no apparent distress alert and oriented x3 Lungs no rhonchi or wheezing Right lower extremity with chronic 1+ edema. Mild tenderness along the course of the great saphenous vein from the saphenofemoral junction.  Today ordered a venous duplex exam of the right leg which I reviewed and interpreted. The great saphenous vein is totally closed from the knee to the saphenofemoral junction. There is some extension of thrombus into the common femoral vein at the saphenofemoral junction which is non-occlusive. 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  I confirmed these findings with the bedside SonoSite ultrasound exam    Assessment:     One-week post laser ablation right great saphenous vein with some  extension of thrombus into common femoral vein at saphenofemoral junction-non-occlusive History of DVT in right popliteal and superficial femoral vein-remains nonocclusive    Plan:     Will treat the patient with Coumadin for 3 months-within return for a venous duplex exam and assess for possible stab phlebectomy and to make certain saphenofemoral junction thrombus has resolved

## 2013-03-10 NOTE — Telephone Encounter (Signed)
Ronnie Johnson with Dr Hart [place] office left v/m; pt had lazer ablation rt great saphenous vein; pt in Dr Candie Chroman office today and [place] noted thrombus protruding into common femoral vein. Pt has hx of DVT. Dr Hart [place] recommends pt to start Coumadin for 3 month therapy as a precaution. Pt was previously on Coumadin 5 mg  and request Dr Para March medically manage pt for coumadin therapy. Ronnie Johnson request call to pt at [phone] or [phone] re starting Coumadin.Please advise.

## 2013-03-10 NOTE — Addendum Note (Signed)
Addended by: Adria Dill L on: 03/10/2013 12:33 PM   Modules accepted: Orders

## 2013-03-10 NOTE — Telephone Encounter (Signed)
Patient advised. Appointment scheduled for Coumadin Clinic on June 5.

## 2013-03-10 NOTE — Telephone Encounter (Signed)
We should be able to do this.  Please schedule the coumadin clinic for Thursday.  Would start coumadin 5mg  a day.  rx sent to [facility].  Note the 3 month duration.

## 2013-03-12 ENCOUNTER — Ambulatory Visit (INDEPENDENT_AMBULATORY_CARE_PROVIDER_SITE_OTHER): Payer: Medicare Other | Admitting: Family Medicine

## 2013-03-12 DIAGNOSIS — I82409 Acute embolism and thrombosis of unspecified deep veins of unspecified lower extremity: Secondary | ICD-10-CM

## 2013-03-12 DIAGNOSIS — Z7901 Long term (current) use of anticoagulants: Secondary | ICD-10-CM

## 2013-03-12 LAB — POCT INR: INR: 1.1

## 2013-03-13 ENCOUNTER — Telehealth: Payer: Self-pay | Admitting: *Deleted

## 2013-03-13 NOTE — Telephone Encounter (Signed)
Pt is going out of town in a week, request an order to have protime drawn while out of town. Pt requests that order be mailed to his home.

## 2013-03-19 ENCOUNTER — Ambulatory Visit (INDEPENDENT_AMBULATORY_CARE_PROVIDER_SITE_OTHER): Payer: Medicare Other | Admitting: Family Medicine

## 2013-03-19 DIAGNOSIS — I82409 Acute embolism and thrombosis of unspecified deep veins of unspecified lower extremity: Secondary | ICD-10-CM

## 2013-03-19 DIAGNOSIS — Z7901 Long term (current) use of anticoagulants: Secondary | ICD-10-CM

## 2013-03-19 LAB — POCT INR: INR: 1.8

## 2013-03-26 ENCOUNTER — Ambulatory Visit (INDEPENDENT_AMBULATORY_CARE_PROVIDER_SITE_OTHER): Payer: Medicare Other | Admitting: Family Medicine

## 2013-03-26 DIAGNOSIS — Z7901 Long term (current) use of anticoagulants: Secondary | ICD-10-CM

## 2013-03-26 DIAGNOSIS — I82409 Acute embolism and thrombosis of unspecified deep veins of unspecified lower extremity: Secondary | ICD-10-CM

## 2013-04-27 ENCOUNTER — Other Ambulatory Visit (INDEPENDENT_AMBULATORY_CARE_PROVIDER_SITE_OTHER): Payer: Medicare Other

## 2013-04-27 DIAGNOSIS — E119 Type 2 diabetes mellitus without complications: Secondary | ICD-10-CM

## 2013-04-27 NOTE — Addendum Note (Signed)
Addended by: Baldomero Lamy on: 04/27/2013 09:26 AM   Modules accepted: Orders

## 2013-05-01 ENCOUNTER — Encounter: Payer: Self-pay | Admitting: Family Medicine

## 2013-05-01 ENCOUNTER — Ambulatory Visit (INDEPENDENT_AMBULATORY_CARE_PROVIDER_SITE_OTHER): Payer: Medicare Other | Admitting: Family Medicine

## 2013-05-01 VITALS — BP 130/74 | HR 60 | Temp 97.4°F | Wt 193.2 lb

## 2013-05-01 DIAGNOSIS — E119 Type 2 diabetes mellitus without complications: Secondary | ICD-10-CM

## 2013-05-01 DIAGNOSIS — Z7901 Long term (current) use of anticoagulants: Secondary | ICD-10-CM

## 2013-05-01 DIAGNOSIS — Z5181 Encounter for therapeutic drug level monitoring: Secondary | ICD-10-CM

## 2013-05-01 MED ORDER — METFORMIN HCL 500 MG PO TABS: 500.0000 mg | ORAL_TABLET | Freq: Every day | ORAL | Status: DC

## 2013-05-01 MED ORDER — METFORMIN HCL 500 MG PO TABS: 500.0000 mg | ORAL_TABLET | Freq: Two times a day (BID) | ORAL | Status: DC

## 2013-05-01 NOTE — Progress Notes (Signed)
Diabetes:  No meds yet.  Hypoglycemic episodes: no sx Hyperglycemic episodes: no sx Feet problems: occ tingling.  He's checking his feet.   Blood Sugars averaging: not often checked eye exam within last year: yes A1c up to 8.2. Diet discussed.  "I'm not doing it like I was.  I like to eat too much."  Has been out of the gym, exercise has been limited. Taking advil occ for hip pain.    [place], about 4 weeks ago.  We'll get INR on the way out.  No bleeding, occ bruising that is mild on the L arm.    Meds, vitals, and allergies reviewed.   ROS: See HPI.  Otherwise negative.    GEN: nad, alert and oriented HEENT: mucous membranes moist NECK: supple w/o LA CV: rrr. PULM: ctab, no inc wob ABD: soft, +bs EXT: no edema on L leg, minimal on R leg (improved after tx per vein clinic) SKIN: no acute rash  Diabetic foot exam: Normal inspection No skin breakdown No calluses  Normal DP pulses Normal sensation to light touch and monofilament Nails normal

## 2013-05-01 NOTE — Patient Instructions (Addendum)
Go to the lab on the way out.  We'll contact you with your lab report.  Start taking metformin once a day. If you have diarrhea, then notify us and stop the medicine.  Recheck in about 3 months with labs ahead of time.  Work on [employer] in the meantime.  Take care.   Schedule a follow up with the coumadin clinic in about 4 weeks.

## 2013-05-03 NOTE — Assessment & Plan Note (Signed)
Start taking metformin once a day. If you have diarrhea, then notify us and stop the medicine.  Recheck in about 3 months with labs ahead of time.  Work on [employer] in the meantime.  He agrees.  Labs d/w pt.

## 2013-05-04 ENCOUNTER — Ambulatory Visit (INDEPENDENT_AMBULATORY_CARE_PROVIDER_SITE_OTHER): Payer: Medicare Other | Admitting: Family Medicine

## 2013-05-04 ENCOUNTER — Other Ambulatory Visit: Payer: Self-pay | Admitting: *Deleted

## 2013-05-04 DIAGNOSIS — Z7901 Long term (current) use of anticoagulants: Secondary | ICD-10-CM

## 2013-05-04 DIAGNOSIS — I82409 Acute embolism and thrombosis of unspecified deep veins of unspecified lower extremity: Secondary | ICD-10-CM

## 2013-05-04 MED ORDER — GLUCOSE BLOOD VI STRP: ORAL_STRIP | Status: DC

## 2013-05-28 ENCOUNTER — Ambulatory Visit: Payer: Medicare Other

## 2013-06-01 ENCOUNTER — Ambulatory Visit (INDEPENDENT_AMBULATORY_CARE_PROVIDER_SITE_OTHER): Payer: Medicare Other | Admitting: Family Medicine

## 2013-06-01 DIAGNOSIS — I82409 Acute embolism and thrombosis of unspecified deep veins of unspecified lower extremity: Secondary | ICD-10-CM

## 2013-06-01 DIAGNOSIS — Z7901 Long term (current) use of anticoagulants: Secondary | ICD-10-CM

## 2013-06-01 LAB — POCT INR: INR: 2.4

## 2013-06-16 ENCOUNTER — Ambulatory Visit: Payer: Medicare Other | Admitting: Vascular Surgery

## 2013-07-13 ENCOUNTER — Ambulatory Visit (INDEPENDENT_AMBULATORY_CARE_PROVIDER_SITE_OTHER): Payer: Medicare Other | Admitting: Family Medicine

## 2013-07-13 DIAGNOSIS — I82409 Acute embolism and thrombosis of unspecified deep veins of unspecified lower extremity: Secondary | ICD-10-CM

## 2013-07-13 DIAGNOSIS — Z7901 Long term (current) use of anticoagulants: Secondary | ICD-10-CM

## 2013-07-13 LAB — POCT INR: INR: 2.5

## 2013-07-21 ENCOUNTER — Ambulatory Visit: Payer: Medicare Other

## 2013-08-10 ENCOUNTER — Other Ambulatory Visit (INDEPENDENT_AMBULATORY_CARE_PROVIDER_SITE_OTHER): Payer: Medicare Other

## 2013-08-10 DIAGNOSIS — E119 Type 2 diabetes mellitus without complications: Secondary | ICD-10-CM

## 2013-08-10 LAB — HEMOGLOBIN A1C: Hgb A1c MFr Bld: 8.2 % — ABNORMAL HIGH (ref 4.6–6.5)

## 2013-08-13 ENCOUNTER — Other Ambulatory Visit: Payer: Medicare Other

## 2013-08-17 ENCOUNTER — Encounter: Payer: Self-pay | Admitting: Vascular Surgery

## 2013-08-17 ENCOUNTER — Encounter: Payer: Self-pay | Admitting: Family Medicine

## 2013-08-17 ENCOUNTER — Ambulatory Visit (INDEPENDENT_AMBULATORY_CARE_PROVIDER_SITE_OTHER): Payer: Medicare Other | Admitting: Family Medicine

## 2013-08-17 VITALS — BP 134/72 | HR 63 | Temp 98.3°F | Wt 199.5 lb

## 2013-08-17 DIAGNOSIS — Z23 Encounter for immunization: Secondary | ICD-10-CM

## 2013-08-17 DIAGNOSIS — E119 Type 2 diabetes mellitus without complications: Secondary | ICD-10-CM

## 2013-08-17 DIAGNOSIS — Z7901 Long term (current) use of anticoagulants: Secondary | ICD-10-CM

## 2013-08-17 DIAGNOSIS — I82409 Acute embolism and thrombosis of unspecified deep veins of unspecified lower extremity: Secondary | ICD-10-CM

## 2013-08-17 LAB — POCT INR: INR: 1.7

## 2013-08-17 MED ORDER — METFORMIN HCL 500 MG PO TABS: 500.0000 mg | ORAL_TABLET | Freq: Two times a day (BID) | ORAL | Status: DC

## 2013-08-17 NOTE — Progress Notes (Signed)
Pre-visit discussion using our clinic review tool. No additional management support is needed unless otherwise documented below in the visit note.  Diabetes:  Using medications without difficulties: yes, 1 metformin a day.  Hypoglycemic episodes: no Hyperglycemic episodes: no Feet problems: no Blood Sugars averaging:  119 this AM. Taken episodically.   eye exam within last year: yes  Diet and exercise d/w.  "I'm not doing so good."  Discussed cutting out carbs, esp potatoes.    Meds, vitals, and allergies reviewed.   ROS: See HPI.  Otherwise negative.    GEN: nad, alert and oriented HEENT: mucous membranes moist NECK: supple w/o LA CV: rrr. PULM: ctab, no inc wob ABD: soft, +bs EXT: no edema SKIN: no acute rash R lower leg with edema in compression stocking (has f/u with vein clinic pending)

## 2013-08-17 NOTE — Patient Instructions (Signed)
Take the metformin twice a day and recheck in about 4 months, labs ahead of time.  Cut back on potatoes in the meantime.  Take care.

## 2013-08-18 ENCOUNTER — Ambulatory Visit: Payer: Self-pay | Admitting: Vascular Surgery

## 2013-08-18 ENCOUNTER — Encounter: Payer: Self-pay | Admitting: Vascular Surgery

## 2013-08-18 ENCOUNTER — Ambulatory Visit (HOSPITAL_COMMUNITY)
Admission: RE | Admit: 2013-08-18 | Discharge: 2013-08-18 | Disposition: A | Discharge status: Home / Self Care | Payer: Medicare Other | Source: Ambulatory Visit | Attending: Vascular Surgery | Admitting: Vascular Surgery

## 2013-08-18 ENCOUNTER — Ambulatory Visit (INDEPENDENT_AMBULATORY_CARE_PROVIDER_SITE_OTHER): Payer: Medicare Other | Admitting: Vascular Surgery

## 2013-08-18 VITALS — BP 152/71 | HR 63 | Resp 16 | Ht 67.0 in | Wt 192.0 lb

## 2013-08-18 DIAGNOSIS — I82409 Acute embolism and thrombosis of unspecified deep veins of unspecified lower extremity: Secondary | ICD-10-CM

## 2013-08-18 DIAGNOSIS — Z86718 Personal history of other venous thrombosis and embolism: Secondary | ICD-10-CM

## 2013-08-18 DIAGNOSIS — I82819 Embolism and thrombosis of superficial veins of unspecified lower extremities: Secondary | ICD-10-CM | POA: Insufficient documentation

## 2013-08-18 DIAGNOSIS — I82401 Acute embolism and thrombosis of unspecified deep veins of right lower extremity: Secondary | ICD-10-CM

## 2013-08-18 DIAGNOSIS — I83893 Varicose veins of bilateral lower extremities with other complications: Secondary | ICD-10-CM

## 2013-08-18 DIAGNOSIS — I82890 Acute embolism and thrombosis of other specified veins: Secondary | ICD-10-CM | POA: Insufficient documentation

## 2013-08-18 NOTE — Assessment & Plan Note (Signed)
A1c not worse, not better.  Needs to work on diet and weight.  Inc metformin to BID, cut carbs. Recheck on 2015.  He agrees.

## 2013-08-18 NOTE — Progress Notes (Signed)
Subjective:     Patient ID: Ronnie Johnson, male   DOB: 08-Jan-1941, 72 y.o.   MRN: [id number]  HPI this 72 year old male turned for continued followup regarding his venous insufficiency of the right leg. He has a remote history of DVT in the right leg which was mostly recanalized and had laser ablation about 4 months ago the right great saphenous vein. There was some thrombus protruding into the common femoral vein which is nonocclusive and he was therefore put on Coumadin for 3 months. Today he returns stating that the edema below the knee is less than it was prior to his procedure. He continues to have painful varicosities but less tense than they were previously.  Past Medical History  Diagnosis Date  . Hypertension   . DVT of leg (deep venous thrombosis) 2009  . Nephrolithiasis   . Hyperlipidemia   . Allergic rhinitis   . CAD (coronary artery disease)   . Urethral diverticulum   . Varicose vein of leg   . Dupuytren's contracture   . ED (erectile dysfunction)   . History of colonic polyps   . GERD (gastroesophageal reflux disease)   . Diverticulosis of colon   . Diabetes mellitus, type 2   . Myocardial infarction   . SCC (squamous cell carcinoma)     scalp 2013 per derm  . DVT (deep venous thrombosis)     History  Substance Use Topics  . Smoking status: Former Smoker    Quit date: 10/08/1988  . Smokeless tobacco: Never Used  . Alcohol Use: No     Comment: RARE    Family History  Problem Relation Age of Onset  . Diabetes Mother   . Hypertension Mother   . Stroke Mother   . Alzheimer's disease Mother   . Heart attack Father   . Hypertension Father   . Prostate cancer Brother   . Diabetes Brother   . Hypertension Brother   . Diabetes Sister   . Colon cancer Neg Hx   . Hypertension Daughter     Allergies  Allergen Reactions  . Latex   . Lipitor [Atorvastatin Calcium]     Tolerates crestor    Current outpatient prescriptions:Ascorbic Acid (VITAMIN C) 500 MG  tablet, Take 500 mg by mouth daily.  , Disp: , Rfl: ;  aspirin 81 MG tablet, Take 81 mg by mouth daily.  , Disp: , Rfl: ;  carvedilol (COREG) 6.25 MG tablet, Take 1 tablet (6.25 mg total) by mouth 2 (two) times daily with a meal., Disp: 180 tablet, Rfl: 3;  cholecalciferol (VITAMIN D) 1000 UNITS tablet, Take 1,000 Units by mouth daily.  , Disp: , Rfl:  Coenzyme Q10 200 MG TABS, Take 1 tablet (200 mg total) by mouth daily., Disp: , Rfl: ;  esomeprazole (NEXIUM) 40 MG capsule, Take 1 capsule (40 mg total) by mouth daily., Disp: 90 capsule, Rfl: 3;  glucose blood (ONE TOUCH ULTRA TEST) test strip, Test blood sugar twice daily as directed.  Dx: 250.00 Non-insulin dependent., Disp: 100 each, Rfl: 6;  lisinopril (PRINIVIL,ZESTRIL) 10 MG tablet, Take 1/2 tablet by mouth each day., Disp: , Rfl:  loratadine (CLARITIN) 10 MG tablet, Take 10 mg by mouth daily.  , Disp: , Rfl: ;  metFORMIN (GLUCOPHAGE) 500 MG tablet, Take 1 tablet (500 mg total) by mouth 2 (two) times daily with a meal., Disp: 180 tablet, Rfl: 3;  rosuvastatin (CRESTOR) 20 MG tablet, Take 1 tablet (20 mg total) by mouth at bedtime., Disp:  90 tablet, Rfl: 3;  triamcinolone cream (KENALOG) 0.1 %, Apply topically 2 (two) times daily., Disp: 30 g, Rfl: 3 warfarin (COUMADIN) 5 MG tablet, Take 1 tablet (5 mg total) by mouth daily., Disp: 30 tablet, Rfl: 3  BP 152/71  Pulse 63  Resp 16  Ht 5\' 7"  (1.702 m)  Wt 192 lb (87.091 kg)  BMI 30.06 kg/m2  Body mass index is 30.06 kg/(m^2).           Review of Systems denies chest pain, dyspnea on exertion, PND, orthopnea, hemoptysis, claudication --does have left rotator cuff problems and will need surgery in the near future     Objective:   Physical Exam BP 152/71  Pulse 63  Resp 16  Ht 5\' 7"  (1.702 m)  Wt 192 lb (87.091 kg)  BMI 30.06 kg/m2  Gen.-alert and oriented x3 in no apparent distress Lungs no rhonchi or wheezing Right lower extremity with 3+ femoral dorsalis pedis pulse palpable. 1+  edema from knee to ankle. Chronic hyperpigmentation with crusty skin lower third of leg and a few very prominent slightly ulcerated reticular veins near the medial malleolus. Bulging varicosities in the medial calf and posterior calf.  Today I ordered a venous duplex exam right leg which are reviewed and interpreted. There is no more thrombus from the GS of the protruding into the common femoral vein this is widely patent. Great saphenous vein is closed from near the junction to the mid thigh. There continues to be some old nonocclusive thrombus in the superficial femoral vein.    Assessment:     Successful laser ablation right great saphenous vein with no thrombus at saphenofemoral junction Continued painful varicosities in the right calf and ankle area     Plan:     Plan DC Coumadin Continue daily aspirin Patient needs stab phlebectomy 10-20 right calf varicosities and one course of sclerotherapy for superficial varix in the ankle area Will proceed with precertification to perform this in the near future to complete his treatment regimen

## 2013-08-24 ENCOUNTER — Ambulatory Visit: Payer: Medicare Other

## 2013-08-25 ENCOUNTER — Ambulatory Visit: Payer: Self-pay | Admitting: Vascular Surgery

## 2013-08-28 ENCOUNTER — Encounter: Payer: Self-pay | Admitting: Vascular Surgery

## 2013-08-31 ENCOUNTER — Ambulatory Visit (INDEPENDENT_AMBULATORY_CARE_PROVIDER_SITE_OTHER): Payer: Medicare Other | Admitting: Vascular Surgery

## 2013-08-31 ENCOUNTER — Encounter: Payer: Self-pay | Admitting: Vascular Surgery

## 2013-08-31 VITALS — BP 136/84 | HR 69 | Resp 16 | Ht 67.0 in | Wt 190.0 lb

## 2013-08-31 DIAGNOSIS — I83893 Varicose veins of bilateral lower extremities with other complications: Secondary | ICD-10-CM

## 2013-08-31 NOTE — Progress Notes (Signed)
Subjective:     Patient ID: Ronnie Johnson, male   DOB: 1941-02-14, 72 y.o.   MRN: [id number]  HPI this 72 year old male had multiple stab phlebectomy is of painful varicosities in the right leg in the pretibial region, medial calf, posterior calf, and ankle area. This was performed under local tumescent anesthesia. He also had sclerotherapy of a small prominent reticular vein in the ankle area near the medial malleolus. He tolerated surgery well.   Review of Systems     Objective:   Physical Exam BP 136/84  Pulse 69  Resp 16  Ht 5\' 7"  (1.702 m)  Wt 190 lb (86.183 kg)  BMI 29.75 kg/m2       Assessment:     Well-tolerated stab phlebectomy of secondary varicosities-10-20-and right lower extremity plus sclerotherapy    Plan:     Return December 29 for continued followup

## 2013-08-31 NOTE — Progress Notes (Signed)
   Laser Ablation Procedure      Date: 08/31/2013    Ronnie Johnson DOB:Mar 10, 1941  Consent signed: Yes  Surgeon:J.D. Hart Rochester  Procedure:  right leg  BP 136/84  Pulse 69  Resp 16  Ht 5\' 7"  (1.702 m)  Wt 190 lb (86.183 kg)  BMI 29.75 kg/m2  Start time: 1:15   End time: 2:20  Tumescent Anesthesia: 100 cc 0.9% NaCl with 50 cc Lidocaine HCL with 1% Epi and 15 cc 8.4% NaHCO3  Local Anesthesia: 3 cc Lidocaine HCL and NaHCO3 (ratio 2:1)     Sclerotherapy: .3% %Sotradecol. Patient received a total of 3 cc  Stab Phlebectomy: 10-20 Sites: Calf  Patient tolerated procedure well: Yes  Notes:   Description of Procedure:    The patient was  put into Trendelenburg position.  Local anesthetic was utilized overlying the marked varicosities.  Ten to 20 stab wounds were made using the tip of an 11 blade; and using the vein hook,  The phlebectomies were performed using a hemostat to avulse these varicosities.  Adequate hemostasis was achieved, and steri strips were applied to the stab wound.    Sclerotherapy was performed to five vessels using 3  cc .3% Sotradecol foam via a 27g butterfly needle.  ABD pads and thigh high compression stockings were applied.  Ace wrap bandages were applied over the phlebectomy sites and sclerotherapy areas.  Blood loss was less than 15 cc.  The patient ambulated out of the operating room having tolerated the procedure well.

## 2013-09-01 ENCOUNTER — Telehealth: Payer: Self-pay | Admitting: *Deleted

## 2013-09-01 ENCOUNTER — Encounter: Payer: Self-pay | Admitting: Vascular Surgery

## 2013-09-01 NOTE — Telephone Encounter (Signed)
Pt had called yesterday complaining of the stocking being tight and causing pain. I rec elevation and explained why the ace wraps are tight (to prevent bleeding). This morning he states it hurts but got better last night when he walked around. Taking Tylenol for the pain. Told him it would be ok to loosen the ace wraps today since he has had no bleeding. Offered to check him today if he wants to come in and have me rewrap. Following all instructions otherwise.

## 2013-09-09 ENCOUNTER — Telehealth: Payer: Self-pay | Admitting: *Deleted

## 2013-09-09 NOTE — Telephone Encounter (Signed)
Mr. Wildes is s/p stab phlebectomy right leg (pretibial, medial calf, posterior calf, and ankle) and sclerotherapy (right ankle) on 08-31-2013 by Dr. Josephina Gip.  Mr. Bott states he is having pain and swelling in right ankle.  Discussed Mr. Gustafson' s symptoms with Dr. Arbie Cookey who recommended seeing Dr. Hart Rochester for follow up on next scheduled office date.  Appointment to see Dr. Hart Rochester made for 09-14-2013 at 10AM made by Cherly Beach. and patient notified via telephone.  Mr. Scarpelli will call [facility] if symptoms intensify or worsen or go to ER if [facility] is closed.

## 2013-09-11 ENCOUNTER — Encounter: Payer: Self-pay | Admitting: Vascular Surgery

## 2013-09-14 ENCOUNTER — Encounter: Payer: Self-pay | Admitting: Vascular Surgery

## 2013-09-14 ENCOUNTER — Ambulatory Visit (INDEPENDENT_AMBULATORY_CARE_PROVIDER_SITE_OTHER): Payer: Medicare Other | Admitting: Vascular Surgery

## 2013-09-14 VITALS — BP 153/90 | HR 65 | Resp 16 | Ht 67.0 in | Wt 196.0 lb

## 2013-09-14 DIAGNOSIS — I83893 Varicose veins of bilateral lower extremities with other complications: Secondary | ICD-10-CM

## 2013-09-14 NOTE — Progress Notes (Signed)
Subjective:     Patient ID: Ronnie Johnson, male   DOB: 02-18-1941, 72 y.o.   MRN: [id number]  HPI this 72 year old male had multiple stab phlebectomy and some sclerotherapy performed in the right leg on November 24. He was concerned about some discomfort associated with sclerotherapy and came to the office today for examination. He has had no chest pain shortness of breath or hemoptysis. He has been wearing his elastic compression stocking.  Review of Systems     Objective:   Physical Exam BP 153/90  Pulse 65  Resp 16  Ht 5\' 7"  (1.702 m)  Wt 196 lb (88.905 kg)  BMI 30.69 kg/m2  \ General well-developed well-nourished male no apparent stress Right lower extremity with nicely healing stab phlebectomy sites between knee and ankle. He sclerotherapy sites near the medial malleolus are mildly tender but not infected or unduly inflamed. No distal edema. Chronic hyperpigmentation. 3+ dorsalis pedis pulse palpable. There is a small dark area over the Achilles tendon posteriorly measuring about 0.5 cm in diameter which is not full-thickness and appears to be a pressure sore which is not infected and should heal      Assessment:     Healing satisfactorily following sclerotherapy and stab phlebectomy    Plan:     Return as scheduled in 5 weeks for final followup

## 2013-09-21 ENCOUNTER — Ambulatory Visit: Payer: Medicare Other

## 2013-09-23 ENCOUNTER — Ambulatory Visit (INDEPENDENT_AMBULATORY_CARE_PROVIDER_SITE_OTHER): Payer: Medicare Other | Admitting: Family Medicine

## 2013-09-23 ENCOUNTER — Encounter: Payer: Self-pay | Admitting: Family Medicine

## 2013-09-23 VITALS — BP 110/64 | HR 68 | Temp 98.1°F | Wt 197.5 lb

## 2013-09-23 DIAGNOSIS — I83893 Varicose veins of bilateral lower extremities with other complications: Secondary | ICD-10-CM

## 2013-09-23 NOTE — Assessment & Plan Note (Signed)
He should heal well.  Good pulse, no erythema, doesn't appear infected.   D/w pt about signs to monitor.  He agrees.   Continue stockings for comfort and to protect his foot from rubbing.   Fu prn.

## 2013-09-23 NOTE — Patient Instructions (Signed)
If you have fever, spreading redness, draining pus, or enlarging scabs then let me know.  Keep using the stockings for now.  The areas should heal up well.

## 2013-09-23 NOTE — Progress Notes (Signed)
Pre-visit discussion using our clinic review tool. No additional management support is needed unless otherwise documented below in the visit note.  Prev vascular eval/tx in the R leg; multiple stab phlebectomy and some sclerotherapy performed in the right leg on November 24.  Prev vascular noted reviewed.  Off coumadin.  No fevers.  R leg is still sore, but not acutely.    Meds, vitals, and allergies reviewed.   ROS: See HPI.  Otherwise, noncontributory.  nad R shin with chronic hyperpigmentation, no ulcers, not ttp 3+ DP pulse on R foot Nails thickened.  5mm rubbed area on the achilles, superficial, doesn't appear infected.  8mm scab near the med mal, doesn't appear infected.  No erythema, abnormal warmth

## 2013-09-30 ENCOUNTER — Encounter: Payer: Self-pay | Admitting: Vascular Surgery

## 2013-10-05 ENCOUNTER — Encounter: Payer: Self-pay | Admitting: Vascular Surgery

## 2013-10-05 ENCOUNTER — Ambulatory Visit (INDEPENDENT_AMBULATORY_CARE_PROVIDER_SITE_OTHER): Payer: Medicare Other | Admitting: Vascular Surgery

## 2013-10-05 VITALS — BP 154/87 | HR 72 | Resp 16 | Ht 68.0 in | Wt 196.0 lb

## 2013-10-05 DIAGNOSIS — I83893 Varicose veins of bilateral lower extremities with other complications: Secondary | ICD-10-CM

## 2013-10-05 NOTE — Progress Notes (Signed)
Subjective:     Patient ID: Ronnie Johnson, male   DOB: 1941/01/02, 72 y.o.   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 He states the right leg feels much better. The scabbed over the ulcerated area has completely disappeared and he notes less discoloration of the foot and less edema. He is wearing short leg elastic compression stockings. He denies any chest pain or shortness of breath.  Review of Systems     Objective:   Physical Exam BP 154/87  Pulse 72  Resp 16  Ht 5\' 8"  (1.727 m)  Wt 196 lb (88.905 kg)  BMI 29.81 kg/m2  General well-developed well-nourished male no apparent stress alert and oriented x3 Right leg with 1+ edema below the knee with no bulging varicosities noted. No eschar or active ulceration noted. Chronic hyperpigmentation is stable and improved in appearance. 3+ dorsalis pedis pulse palpable.     Assessment:     Doing well post laser ablation right great saphenous vein with multiple stab phlebectomy and sclerotherapy later date no complaints at present time    Plan:     Return to see [place] on when necessary basis

## 2013-10-10 ENCOUNTER — Other Ambulatory Visit: Payer: Self-pay | Admitting: Family Medicine

## 2013-10-29 ENCOUNTER — Other Ambulatory Visit: Payer: Self-pay | Admitting: Family Medicine

## 2013-10-29 ENCOUNTER — Other Ambulatory Visit (INDEPENDENT_AMBULATORY_CARE_PROVIDER_SITE_OTHER): Payer: Medicare Other

## 2013-10-29 DIAGNOSIS — Z125 Encounter for screening for malignant neoplasm of prostate: Secondary | ICD-10-CM

## 2013-10-29 DIAGNOSIS — E119 Type 2 diabetes mellitus without complications: Secondary | ICD-10-CM

## 2013-10-29 LAB — COMPREHENSIVE METABOLIC PANEL
ALT: 15 U/L (ref 0–53)
AST: 15 U/L (ref 0–37)
Albumin: 4 g/dL (ref 3.5–5.2)
Alkaline Phosphatase: 51 U/L (ref 39–117)
BUN: 12 mg/dL (ref 6–23)
CALCIUM: 9.2 mg/dL (ref 8.4–10.5)
CHLORIDE: 106 meq/L (ref 96–112)
CO2: 29 mEq/L (ref 19–32)
Creatinine, Ser: 0.9 mg/dL (ref 0.4–1.5)
GFR: 92.99 mL/min (ref >60.00)
Glucose, Bld: 134 mg/dL — ABNORMAL HIGH (ref 70–99)
Potassium: 4.5 mEq/L (ref 3.5–5.1)
SODIUM: 142 meq/L (ref 135–145)
Total Bilirubin: 0.9 mg/dL (ref 0.3–1.2)
Total Protein: 6.8 g/dL (ref 6.0–8.3)

## 2013-10-29 LAB — LIPID PANEL
CHOL/HDL RATIO: 2
CHOLESTEROL: 78 mg/dL (ref 0–200)
HDL: 37.5 mg/dL — ABNORMAL LOW (ref >39.00)
LDL Cholesterol: 25 mg/dL (ref 0–99)
TRIGLYCERIDES: 80 mg/dL (ref 0.0–149.0)
VLDL: 16 mg/dL (ref 0.0–40.0)

## 2013-10-29 LAB — HEMOGLOBIN A1C: Hgb A1c MFr Bld: 7.6 % — ABNORMAL HIGH (ref 4.6–6.5)

## 2013-10-29 LAB — PSA, MEDICARE: PSA: 2.03 ng/ml (ref 0.10–4.00)

## 2013-10-30 ENCOUNTER — Telehealth: Payer: Self-pay

## 2013-10-30 NOTE — Telephone Encounter (Signed)
Relevant patient education assigned to patient using Emmi. ° °

## 2013-11-06 ENCOUNTER — Ambulatory Visit (INDEPENDENT_AMBULATORY_CARE_PROVIDER_SITE_OTHER): Payer: Medicare Other | Admitting: Family Medicine

## 2013-11-06 ENCOUNTER — Encounter: Payer: Self-pay | Admitting: Family Medicine

## 2013-11-06 VITALS — BP 102/62 | HR 65 | Temp 97.9°F | Ht 66.0 in | Wt 196.8 lb

## 2013-11-06 DIAGNOSIS — E119 Type 2 diabetes mellitus without complications: Secondary | ICD-10-CM

## 2013-11-06 DIAGNOSIS — I10 Essential (primary) hypertension: Secondary | ICD-10-CM

## 2013-11-06 DIAGNOSIS — E785 Hyperlipidemia, unspecified: Secondary | ICD-10-CM

## 2013-11-06 DIAGNOSIS — Z0000 Encounter for general adult medical examination without abnormal findings: Secondary | ICD-10-CM

## 2013-11-06 NOTE — Progress Notes (Signed)
Pre-visit discussion using our clinic review tool. No additional management support is needed unless otherwise documented below in the visit note.  I have personally reviewed the Medicare Annual Wellness questionnaire and have noted 1. The patient's medical and social history 2. Their use of alcohol, tobacco or illicit drugs 3. Their current medications and supplements 4. The patient's functional ability including ADL's, fall risks, home safety risks and hearing or visual             impairment. 5. Diet and physical activities 6. Evidence for depression or mood disorders  The patients weight, height, BMI have been recorded in the chart and visual acuity is per eye clinic.  I have made referrals, counseling and provided education to the patient based review of the above and I have provided the pt with a written personalized care plan for preventive services.  See scanned forms.  Routine anticipatory guidance given to patient.  See health maintenance. Flu 2014 Shingles 2012 PNA 2011 Tetanus 2012 Colonoscopy 2009 Prostate cancer screening 2015 Cognitive function addressed- see scanned forms- and if abnormal then additional documentation follows.  Living will encouraged.  Wife would be designated if incapacitated.    Diabetes:  Using medications without difficulties: yes Hypoglycemic episodes:no Hyperglycemic episodes:no Feet problems:no Blood Sugars averaging: ~120-130 in AM eye exam within last year: encouraged, due  Elevated Cholesterol: Using medications without problems:yes Muscle aches: no Diet compliance: encouraged Exercise: yes until his leg intervention.  He'll get back to his prev routine.    Hypertension: Using medication without problems or lightheadedness: yes Chest pain with exertion:no Edema:some in the R leg, at baseline Short of breath:no  Due for cards f/u later in 2015.    PMH and SH reviewed  Meds, vitals, and allergies reviewed.   ROS: See HPI.   Otherwise negative.    GEN: nad, alert and oriented HEENT: mucous membranes moist NECK: supple w/o LA CV: rrr. PULM: ctab, no inc wob ABD: soft, +bs EXT: no edema SKIN: no acute rash  Diabetic foot exam: Normal inspection No skin breakdown No calluses  Normal DP pulses Normal sensation to light touch and monofilament Nails normal

## 2013-11-06 NOTE — Patient Instructions (Addendum)
Call about your eye exam.   Don't change your meds for now.  Keep working on your weight, try to get back into exercising gradually.  Recheck A1c in about 4 months before a visit.  Glad to see you.

## 2013-11-09 NOTE — Assessment & Plan Note (Signed)
Some improvement, diet and exercise encouraged, no change in meds.  He'll call about an eye exam.

## 2013-11-09 NOTE — Assessment & Plan Note (Signed)
Controlled, continue current med.  

## 2013-11-09 NOTE — Assessment & Plan Note (Signed)
See scanned forms.  Routine anticipatory guidance given to patient.  See health maintenance. Flu 2014 Shingles 2012 PNA 2011 Tetanus 2012 Colonoscopy 2009 Prostate cancer screening 2015 Cognitive function addressed- see scanned forms- and if abnormal then additional documentation follows.  Living will encouraged.  Wife would be designated if incapacitated.

## 2013-11-12 ENCOUNTER — Telehealth: Payer: Self-pay

## 2013-11-12 NOTE — Telephone Encounter (Signed)
Mrs Fleischer wants pt chart updated with new mail order pharmacy; [facility]. Advised done; pt does not need refills at this time.

## 2013-11-27 ENCOUNTER — Other Ambulatory Visit: Payer: Self-pay

## 2013-11-27 MED ORDER — ROSUVASTATIN CALCIUM 20 MG PO TABS: 20.0000 mg | ORAL_TABLET | Freq: Every day | ORAL | Status: DC

## 2013-11-27 NOTE — Telephone Encounter (Signed)
Ronnie Johnson left v/m requesting refill crestor to [facility]. Advised pt's wife done.

## 2014-01-05 ENCOUNTER — Telehealth: Payer: Self-pay

## 2014-01-05 MED ORDER — CARVEDILOL 6.25 MG PO TABS: 6.2500 mg | ORAL_TABLET | Freq: Two times a day (BID) | ORAL | Status: DC

## 2014-01-05 MED ORDER — LISINOPRIL 10 MG PO TABS: ORAL_TABLET | ORAL | Status: DC

## 2014-01-05 MED ORDER — METFORMIN HCL 500 MG PO TABS: 500.0000 mg | ORAL_TABLET | Freq: Two times a day (BID) | ORAL | Status: DC

## 2014-01-05 NOTE — Telephone Encounter (Signed)
Sent, thanks

## 2014-01-05 NOTE — Telephone Encounter (Addendum)
Mrs Jeppsen left v/m requesting refill carvedilol, lisinopril and metformin; med list has lisinopril 10 mg taking 1/2 tab daily, Mrs Jansma v/m said lisinopril 10 mg taking 1 tab daily. Left v/m Mrs Filippi to cb to verify how pt taking med. Spoke with Mrs Medford and she was wrong pt takes lisinopril 10 mg taking 1/2 tab daily. Mrs Soja also request one years refill instead of 6 months sent to [facility].Please advise.

## 2014-01-06 LAB — HM DIABETES EYE EXAM

## 2014-01-08 ENCOUNTER — Encounter: Payer: Self-pay | Admitting: Cardiovascular Disease

## 2014-01-08 ENCOUNTER — Ambulatory Visit (INDEPENDENT_AMBULATORY_CARE_PROVIDER_SITE_OTHER): Payer: Medicare Other | Admitting: Cardiovascular Disease

## 2014-01-08 VITALS — BP 120/62 | HR 68 | Ht 67.0 in | Wt 193.0 lb

## 2014-01-08 DIAGNOSIS — I10 Essential (primary) hypertension: Secondary | ICD-10-CM

## 2014-01-08 DIAGNOSIS — E785 Hyperlipidemia, unspecified: Secondary | ICD-10-CM

## 2014-01-08 DIAGNOSIS — I2510 Atherosclerotic heart disease of native coronary artery without angina pectoris: Secondary | ICD-10-CM

## 2014-01-08 DIAGNOSIS — E119 Type 2 diabetes mellitus without complications: Secondary | ICD-10-CM

## 2014-01-08 MED ORDER — NITROGLYCERIN 0.4 MG SL SUBL: 0.4000 mg | SUBLINGUAL_TABLET | SUBLINGUAL | Status: DC | PRN

## 2014-01-08 NOTE — Assessment & Plan Note (Signed)
Blood pressure is well controlled on today's visit. No changes made to the medications. 

## 2014-01-08 NOTE — Assessment & Plan Note (Signed)
We have encouraged continued exercise, careful diet management in an effort to lose weight. 

## 2014-01-08 NOTE — Progress Notes (Signed)
Patient ID: Ronnie Johnson, male    DOB: 1940-12-07, 73 y.o.   MRN: [id number]  HPI Comments: 73 yo with history of CAD s/p MI in April 2010 and DVT right leg in January 2009, hypertension, hyperlipidemia,presents for followup.   He denies any recent chest pain.  He is tolerating Crestor 20 mg daily with no significant muscle ache. He's not been exercising at the gym. Hemoglobin A1c improved now in the 7 range. Not watching his diet closely. Weight continues to be a problem. Denies any shortness of breath, no near syncope or syncope He had recent vein surgery on the right lower extremity improvement of his leg swelling. Total cholesterol 78, LDL 25, HDL 37   MI  April 2010.   Myoview showing a large lateral defect from a base to apex, that was mainly fixed with some peri-infarct ischemia. EF was 42%.    left heart catheterization in May 2010, that showed EF of 55%, left ventricular end-diastolic pressure of 21 mmHg, and on coronary angiography, there were separate ostia to the LAD and the circumflex. There was a 30-40% ostial LAD stenosis and 40% first diagonal stenosis. The first obtuse marginal was totally occluded. There was a 75% stenosis in the AV circumflex after the first obtuse marginal. There was a 70% ostial stenosis in the second obtuse marginal. There was an 80% stenosis in the circumflex after the second obtuse marginal (the circumflex was a small vessel at that point). It was decided to manage the patient medically.    EKG shows normal sinus rhythm with rate 68 beats per minute, no significant ST or T wave changes      Outpatient Encounter Prescriptions as of 01/08/2014  Medication Sig  . Ascorbic Acid (VITAMIN C) 500 MG tablet Take 500 mg by mouth daily.    Marland Kitchen aspirin 81 MG tablet Take 81 mg by mouth daily.    . carvedilol (COREG) 6.25 MG tablet Take 1 tablet (6.25 mg total) by mouth 2 (two) times daily with a meal.  . cholecalciferol (VITAMIN D) 1000 UNITS tablet Take 1,000  Units by mouth daily.    . Coenzyme Q10 200 MG TABS Take 1 tablet (200 mg total) by mouth daily.  Marland Kitchen esomeprazole (NEXIUM) 40 MG capsule Take 1 capsule (40 mg total) by mouth daily.  Marland Kitchen glucose blood (ONE TOUCH ULTRA TEST) test strip Test blood sugar twice daily as directed.  Dx: 250.00 Non-insulin dependent.  Marland Kitchen lisinopril (PRINIVIL,ZESTRIL) 10 MG tablet Take 1/2 tablet by mouth each day.  . metFORMIN (GLUCOPHAGE) 500 MG tablet Take 1 tablet (500 mg total) by mouth 2 (two) times daily with a meal.  . rosuvastatin (CRESTOR) 20 MG tablet Take 1 tablet (20 mg total) by mouth at bedtime.  . triamcinolone cream (KENALOG) 0.1 % Apply topically 2 (two) times daily.    Review of Systems  Constitutional: Negative.   HENT: Negative.   Eyes: Negative.   Respiratory: Negative.   Cardiovascular: Negative.   Gastrointestinal: Negative.   Endocrine: Negative.   Musculoskeletal: Positive for arthralgias and myalgias.  Skin: Negative.   Allergic/Immunologic: Negative.   Neurological: Negative.   Hematological: Negative.   Psychiatric/Behavioral: Negative.   All other systems reviewed and are negative.    BP 120/62  Pulse 68  Ht 5\' 7"  (1.702 m)  Wt 193 lb (87.544 kg)  BMI 30.22 kg/m2  Physical Exam  Nursing note and vitals reviewed. Constitutional: He is oriented to person, place, and time. He appears well-developed  and well-nourished.  obese  HENT:  Head: Normocephalic.  Nose: Nose normal.  Mouth/Throat: Oropharynx is clear and moist.  Eyes: Conjunctivae are normal. Pupils are equal, round, and reactive to light.  Neck: Normal range of motion. Neck supple. No JVD present.  Cardiovascular: Normal rate, regular rhythm, S1 normal, S2 normal, normal heart sounds and intact distal pulses.  Exam reveals no gallop and no friction rub.   No murmur heard. Pulmonary/Chest: Effort normal and breath sounds normal. No respiratory distress. He has no wheezes. He has no rales. He exhibits no tenderness.   Abdominal: Soft. Bowel sounds are normal. He exhibits no distension. There is no tenderness.  Musculoskeletal: Normal range of motion. He exhibits no edema and no tenderness.  Lymphadenopathy:    He has no cervical adenopathy.  Neurological: He is alert and oriented to person, place, and time. Coordination normal.  Skin: Skin is warm and dry. No rash noted. No erythema.  Psychiatric: He has a normal mood and affect. His behavior is normal. Judgment and thought content normal.      Assessment and Plan

## 2014-01-08 NOTE — Assessment & Plan Note (Signed)
Currently with no symptoms of angina. No further workup at this time. Continue current medication regimen. 

## 2014-01-08 NOTE — Assessment & Plan Note (Signed)
Total cholesterol was very low. We have recommended he cut his Crestor down to 10 mg daily

## 2014-01-08 NOTE — Patient Instructions (Addendum)
You are doing well. Please cut the crestor in 1/2 daily (10 mg)  Please call us if you have new issues that need to be addressed before your next appt.  Your physician wants you to follow-up in: 12 months.  You will receive a reminder letter in the mail two months in advance. If you don't receive a letter, please call our office to schedule the follow-up appointment.

## 2014-01-21 ENCOUNTER — Ambulatory Visit: Payer: Self-pay | Admitting: Gastroenterology

## 2014-01-21 LAB — HM COLONOSCOPY

## 2014-01-25 LAB — PATHOLOGY REPORT

## 2014-02-01 ENCOUNTER — Encounter: Payer: Self-pay | Admitting: Family Medicine

## 2014-02-05 ENCOUNTER — Encounter: Payer: Self-pay | Admitting: Family Medicine

## 2014-02-05 DIAGNOSIS — N200 Calculus of kidney: Secondary | ICD-10-CM | POA: Insufficient documentation

## 2014-02-12 ENCOUNTER — Other Ambulatory Visit: Payer: Self-pay | Admitting: Urology

## 2014-02-16 ENCOUNTER — Encounter (HOSPITAL_COMMUNITY): Payer: Self-pay | Admitting: *Deleted

## 2014-02-17 NOTE — H&P (Signed)
Urology History and Physical Exam  CC: Kidney stone  HPI: 73  year old male presents for ESL of a 12 mm right renal pelvic stone.  PMH: Past Medical History  Diagnosis Date  . Hypertension   . DVT of leg (deep venous thrombosis) 2009  . Nephrolithiasis   . Hyperlipidemia   . Allergic rhinitis   . CAD (coronary artery disease)   . Urethral diverticulum   . Varicose vein of leg   . Dupuytren's contracture   . ED (erectile dysfunction)   . History of colonic polyps   . GERD (gastroesophageal reflux disease)   . Diverticulosis of colon   . Diabetes mellitus, type 2   . Myocardial infarction   . SCC (squamous cell carcinoma)     scalp 2013 per derm  . DVT (deep venous thrombosis)     PSH: Past Surgical History  Procedure Laterality Date  . Appendectomy  1994  . Umbilical hernia repair  03/14/2005  . Mohs surgery  06/18/2007    Left ear basal cell    Allergies: Allergies  Allergen Reactions  . Lipitor [Atorvastatin Calcium]     Tolerates crestor  . Tape   . Latex Rash    Medications: No prescriptions prior to admission     Social History: History   Social History  . Marital Status: Married    Spouse Name: N/A    Number of Children: 2  . Years of Education: N/A   Occupational History  . RETIRED     Advice worker   Social History Main Topics  . Smoking status: Former Smoker    Quit date: 10/08/1988  . Smokeless tobacco: Never Used  . Alcohol Use: Yes     Comment: RARE  . Drug Use: No  . Sexual Activity: Not on file   Other Topics Concern  . Not on file   Social History Narrative   Marital Status: Married 1976, second time   Children: 2 CHILDREN, 1 step daughter    Occupation: RETIRED Social worker   Tobacco Use - Former. -- 1990   Alcohol Use - yes - rarely   enjoys fishing    Family History: Family History  Problem Relation Age of Onset  . Diabetes Mother   . Hypertension Mother   . Stroke Mother   .  Alzheimer's disease Mother   . Heart attack Father   . Hypertension Father   . Prostate cancer Brother   . Diabetes Brother   . Hypertension Brother   . Diabetes Sister   . Colon cancer Neg Hx   . Hypertension Daughter      Review of Systems Genitourinary, constitutional, skin, eye, otolaryngeal, hematologic/lymphatic, cardiovascular, pulmonary, endocrine, musculoskeletal, gastrointestinal, neurological and psychiatric system(s) were reviewed and pertinent findings if present are noted.  Genitourinary: nocturia and hematuria.  Integumentary: pruritus.  ENT: sinus problems.  Cardiovascular: leg swelling.                     Physical Exam: @VITALS2 @ Constitutional: Well nourished and well developed . No acute distress.  ENT:. The ears and nose are normal in appearance.  Neck: The appearance of the neck is normal and no neck mass is present.  Pulmonary: No respiratory distress and normal respiratory rhythm and effort.  Cardiovascular: Heart rate and rhythm are normal . No peripheral edema.  Abdomen: The abdomen is mildly obese. The abdomen is soft and nontender. No masses are palpated. mild right CVA tenderness. No  hernias are palpable. No hepatosplenomegaly noted.  Rectal: Rectal exam demonstrates normal sphincter tone, the anus is normal on inspection., no tenderness and no masses. Prostate size is estimated to be 70 g. Normal rectal tone, no rectal masses, prostate is smooth, symmetric and non-tender. The prostate has no nodularity and is not tender. The left seminal vesicle is nonpalpable. The right seminal vesicle is nonpalpable. The perineum is normal on inspection.  Genitourinary: Examination of the penis demonstrates no discharge, no masses, no lesions and a normal meatus. The penis is uncircumcised. The scrotum is normal in appearance and without lesions. The right epididymis is palpably normal and non-tender. The left epididymis is palpably normal and non-tender. The right testis  is palpably normal, non-tender and without masses. The left testis is normal, non-tender and without masses.  Lymphatics: The femoral and inguinal nodes are not enlarged or tender.  Skin: Normal skin turgor, no visible rash and no visible skin lesions.  Neuro/Psych:. Mood and affect are appropriate.      Studies:  No results found for this basename: HGB, WBC, PLT,  in the last 72 hours  No results found for this basename: NA, K, CL, CO2, BUN, CREATININE, CALCIUM, MAGNESIUM, GFRNONAA, GFRAA,  in the last 72 hours   No results found for this basename: PT, INR, APTT,  in the last 72 hours   No components found with this basename: ABG,     Assessment:  12 mm right upj stone  Plan: ESL

## 2014-02-18 ENCOUNTER — Encounter: Payer: Self-pay | Admitting: Family Medicine

## 2014-02-18 ENCOUNTER — Encounter (HOSPITAL_COMMUNITY)
Admission: RE | Disposition: A | Discharge status: Home / Self Care | Payer: Self-pay | Source: Ambulatory Visit | Attending: Urology

## 2014-02-18 ENCOUNTER — Encounter (HOSPITAL_COMMUNITY): Payer: Self-pay | Admitting: *Deleted

## 2014-02-18 ENCOUNTER — Ambulatory Visit (HOSPITAL_COMMUNITY): Payer: Medicare Other

## 2014-02-18 ENCOUNTER — Ambulatory Visit (HOSPITAL_COMMUNITY)
Admission: RE | Admit: 2014-02-18 | Discharge: 2014-02-18 | Disposition: A | Discharge status: Home / Self Care | Payer: Medicare Other | Source: Ambulatory Visit | Attending: Urology | Admitting: Urology

## 2014-02-18 DIAGNOSIS — Z87891 Personal history of nicotine dependence: Secondary | ICD-10-CM | POA: Insufficient documentation

## 2014-02-18 DIAGNOSIS — Z888 Allergy status to other drugs, medicaments and biological substances status: Secondary | ICD-10-CM | POA: Insufficient documentation

## 2014-02-18 DIAGNOSIS — Z86718 Personal history of other venous thrombosis and embolism: Secondary | ICD-10-CM | POA: Insufficient documentation

## 2014-02-18 DIAGNOSIS — I10 Essential (primary) hypertension: Secondary | ICD-10-CM | POA: Insufficient documentation

## 2014-02-18 DIAGNOSIS — E785 Hyperlipidemia, unspecified: Secondary | ICD-10-CM | POA: Insufficient documentation

## 2014-02-18 DIAGNOSIS — I252 Old myocardial infarction: Secondary | ICD-10-CM | POA: Insufficient documentation

## 2014-02-18 DIAGNOSIS — Z91040 Latex allergy status: Secondary | ICD-10-CM | POA: Insufficient documentation

## 2014-02-18 DIAGNOSIS — I2510 Atherosclerotic heart disease of native coronary artery without angina pectoris: Secondary | ICD-10-CM | POA: Insufficient documentation

## 2014-02-18 DIAGNOSIS — K219 Gastro-esophageal reflux disease without esophagitis: Secondary | ICD-10-CM | POA: Insufficient documentation

## 2014-02-18 DIAGNOSIS — E119 Type 2 diabetes mellitus without complications: Secondary | ICD-10-CM | POA: Insufficient documentation

## 2014-02-18 DIAGNOSIS — N200 Calculus of kidney: Secondary | ICD-10-CM | POA: Insufficient documentation

## 2014-02-18 LAB — GLUCOSE, CAPILLARY: Glucose-Capillary: 186 mg/dL — ABNORMAL HIGH (ref 70–99)

## 2014-02-18 SURGERY — LITHOTRIPSY, ESWL
Anesthesia: LOCAL | Laterality: Right

## 2014-02-18 MED ORDER — DIAZEPAM 5 MG PO TABS
10.0000 mg | ORAL_TABLET | ORAL | Status: AC
  Administered 2014-02-18: 10 mg via ORAL
  Filled 2014-02-18: qty 2

## 2014-02-18 MED ORDER — LEVOFLOXACIN 500 MG PO TABS
500.0000 mg | ORAL_TABLET | ORAL | Status: AC
  Administered 2014-02-18: 500 mg via ORAL
  Filled 2014-02-18: qty 1

## 2014-02-18 MED ORDER — DEXTROSE-NACL 5-0.45 % IV SOLN
INTRAVENOUS | Status: DC
  Administered 2014-02-18: 08:00:00 via INTRAVENOUS

## 2014-02-18 MED ORDER — DIPHENHYDRAMINE HCL 25 MG PO CAPS
25.0000 mg | ORAL_CAPSULE | ORAL | Status: AC
  Administered 2014-02-18: 25 mg via ORAL
  Filled 2014-02-18: qty 1

## 2014-02-18 NOTE — Discharge Instructions (Signed)
See [facility] discharge instructions in chart.  

## 2014-03-05 ENCOUNTER — Encounter: Payer: Self-pay | Admitting: Family Medicine

## 2014-04-29 NOTE — Telephone Encounter (Addendum)
Pt's wife left v/m; Mrs Cragle said received mail order rx for lisinopril and only got # 20. I spoke with Mrs Lotz and she had me speak with Mr Mittelman who said he has never taken lisinopril 10 mg cutting pill in half; pt has been taking Lisinopril 10 mg one tab daily. I advised him of conversation on 01/05/14 and pt said no he has never taken 1/2 tab and wants lisinopril 10 mg # 90 taking one daily sent to [facility] Please advise.

## 2014-04-30 MED ORDER — LISINOPRIL 10 MG PO TABS: 10.0000 mg | ORAL_TABLET | Freq: Every day | ORAL | Status: DC

## 2014-04-30 NOTE — Addendum Note (Signed)
Addended by: Tonia Ghent on: 04/30/2014 05:42 AM   Modules accepted: Orders, Medications

## 2014-04-30 NOTE — Telephone Encounter (Signed)
Sent!

## 2014-07-02 ENCOUNTER — Ambulatory Visit (INDEPENDENT_AMBULATORY_CARE_PROVIDER_SITE_OTHER): Payer: Medicare Other | Admitting: Family Medicine

## 2014-07-02 ENCOUNTER — Other Ambulatory Visit: Payer: Self-pay | Admitting: Family Medicine

## 2014-07-02 ENCOUNTER — Ambulatory Visit (INDEPENDENT_AMBULATORY_CARE_PROVIDER_SITE_OTHER)
Admission: RE | Admit: 2014-07-02 | Discharge: 2014-07-02 | Disposition: A | Discharge status: Home / Self Care | Payer: Medicare Other | Source: Ambulatory Visit | Attending: Family Medicine | Admitting: Family Medicine

## 2014-07-02 ENCOUNTER — Encounter: Payer: Self-pay | Admitting: Family Medicine

## 2014-07-02 VITALS — BP 140/78 | HR 60 | Temp 98.3°F | Wt 187.8 lb

## 2014-07-02 DIAGNOSIS — R319 Hematuria, unspecified: Secondary | ICD-10-CM

## 2014-07-02 DIAGNOSIS — E119 Type 2 diabetes mellitus without complications: Secondary | ICD-10-CM

## 2014-07-02 DIAGNOSIS — R300 Dysuria: Secondary | ICD-10-CM

## 2014-07-02 LAB — POCT URINALYSIS DIPSTICK
Bilirubin, UA: NEGATIVE
Glucose, UA: NEGATIVE
Leukocytes, UA: NEGATIVE
Nitrite, UA: NEGATIVE
UROBILINOGEN UA: 2
pH, UA: 6

## 2014-07-02 LAB — HEMOGLOBIN A1C: HEMOGLOBIN A1C: 8 % — AB (ref 4.6–6.5)

## 2014-07-02 MED ORDER — ROSUVASTATIN CALCIUM 20 MG PO TABS: 20.0000 mg | ORAL_TABLET | Freq: Every day | ORAL | Status: DC

## 2014-07-02 MED ORDER — CIPROFLOXACIN HCL 250 MG PO TABS: 250.0000 mg | ORAL_TABLET | Freq: Two times a day (BID) | ORAL | Status: DC

## 2014-07-02 NOTE — Patient Instructions (Signed)
Drink plenty of water and start the antibiotics today.  We'll contact you with your lab report.  Take care.   If you aren't getting better, then notify us.

## 2014-07-02 NOTE — Progress Notes (Signed)
Pre visit review using our clinic review tool, if applicable. No additional management support is needed unless otherwise documented below in the visit note.  Prev with lithotripsy in 5/15, f/u neg KUB.  Was doing well until 4-5 days ago.  Now with possible blood in urine per patient, noted on dip today.  No pressure with urination but some slight burning.  Lower abd sore, below the umbilicus.  Urinary urgency.  This doesn't feel like his prev stone episodes. No FCNAVD.  No h/o UTIs prev.    Meds, vitals, and allergies reviewed.   ROS: See HPI.  Otherwise, noncontributory.  nad ncat Mmm rrr ctab Abd soft, suprapubic area slightly ttp, no cva pain Ext w/o edema Well appearing

## 2014-07-04 DIAGNOSIS — R300 Dysuria: Secondary | ICD-10-CM | POA: Insufficient documentation

## 2014-07-04 LAB — URINE CULTURE: Colony Count: 6000

## 2014-07-04 NOTE — Assessment & Plan Note (Signed)
Presumed UTI.  Nontoxic.  D/w pt.  No sign of a stone, not typical for episodes with a stone prev.  Check ucx, start cipro and have him update [place].  He agrees.

## 2014-08-04 ENCOUNTER — Other Ambulatory Visit: Payer: Self-pay

## 2014-08-04 ENCOUNTER — Ambulatory Visit (INDEPENDENT_AMBULATORY_CARE_PROVIDER_SITE_OTHER): Payer: Medicare Other

## 2014-08-04 DIAGNOSIS — Z23 Encounter for immunization: Secondary | ICD-10-CM

## 2014-08-04 MED ORDER — ROSUVASTATIN CALCIUM 20 MG PO TABS: 20.0000 mg | ORAL_TABLET | Freq: Every day | ORAL | Status: DC

## 2014-08-04 NOTE — Telephone Encounter (Signed)
Pt left note requesting refill crestor to [facility]. Pt has appt 11/09/14 scheduled.refill done.

## 2014-08-05 DIAGNOSIS — Z23 Encounter for immunization: Secondary | ICD-10-CM

## 2014-08-09 ENCOUNTER — Telehealth: Payer: Self-pay

## 2014-08-09 NOTE — Telephone Encounter (Signed)
Mrs Loken said their ins co will give gift card if pt had A1C between 05/08/14 and 08/07/14; advised had A1C on 07/02/14. Mrs Ohanesian voiced understanding.

## 2014-09-07 ENCOUNTER — Ambulatory Visit: Payer: Self-pay | Admitting: Orthopedic Surgery

## 2014-09-16 ENCOUNTER — Ambulatory Visit: Payer: Self-pay | Admitting: Orthopedic Surgery

## 2014-09-23 ENCOUNTER — Other Ambulatory Visit: Payer: Self-pay | Admitting: *Deleted

## 2014-09-23 MED ORDER — GLUCOSE BLOOD VI STRP: ORAL_STRIP | Status: DC

## 2014-09-23 NOTE — Addendum Note (Signed)
Addended by: Josetta Huddle on: 09/23/2014 05:05 PM   Modules accepted: Orders

## 2014-09-24 ENCOUNTER — Other Ambulatory Visit: Payer: Self-pay | Admitting: *Deleted

## 2014-10-06 ENCOUNTER — Ambulatory Visit: Payer: Self-pay | Admitting: Orthopedic Surgery

## 2014-10-06 LAB — CBC
HCT: 45.7 % (ref 40.0–52.0)
HGB: 14.3 g/dL (ref 13.0–18.0)
MCH: 26 pg (ref 26.0–34.0)
MCHC: 31.3 g/dL — AB (ref 32.0–36.0)
MCV: 83 fL (ref 80–100)
PLATELETS: 138 10*3/uL — AB (ref 150–440)
RBC: 5.49 10*6/uL (ref 4.40–5.90)
RDW: 14.9 % — ABNORMAL HIGH (ref 11.5–14.5)
WBC: 5.3 10*3/uL (ref 3.8–10.6)

## 2014-10-06 LAB — BASIC METABOLIC PANEL
ANION GAP: 6 — AB (ref 7–16)
BUN: 13 mg/dL (ref 7–18)
CO2: 29 mmol/L (ref 21–32)
CREATININE: 0.76 mg/dL (ref 0.60–1.30)
Calcium, Total: 8.7 mg/dL (ref 8.5–10.1)
Chloride: 108 mmol/L — ABNORMAL HIGH (ref 98–107)
EGFR (African American): >60
EGFR (Non-African Amer.): >60
GLUCOSE: 154 mg/dL — AB (ref 65–99)
Osmolality: 288 (ref 275–301)
POTASSIUM: 3.9 mmol/L (ref 3.5–5.1)
Sodium: 143 mmol/L (ref 136–145)

## 2014-10-06 LAB — URINALYSIS, COMPLETE
BACTERIA: NONE SEEN
Bilirubin,UR: NEGATIVE
Blood: NEGATIVE
GLUCOSE, UR: NEGATIVE mg/dL (ref 0–75)
Hyaline Cast: 2
Ketone: NEGATIVE
LEUKOCYTE ESTERASE: NEGATIVE
Nitrite: NEGATIVE
PROTEIN: NEGATIVE
Ph: 5 (ref 4.5–8.0)
RBC,UR: 1 /HPF (ref 0–5)
SPECIFIC GRAVITY: 1.026 (ref 1.003–1.030)
SQUAMOUS EPITHELIAL: NONE SEEN
WBC UR: 2 /HPF (ref 0–5)

## 2014-10-06 LAB — SEDIMENTATION RATE: ERYTHROCYTE SED RATE: 3 mm/h (ref 0–20)

## 2014-10-06 LAB — APTT: ACTIVATED PTT: 30 s (ref 23.6–35.9)

## 2014-10-06 LAB — PROTIME-INR
INR: 1
Prothrombin Time: 13.3 secs (ref 11.5–14.7)

## 2014-10-06 LAB — MRSA PCR SCREENING

## 2014-10-08 HISTORY — PX: REPLACEMENT TOTAL KNEE: SUR1224

## 2014-10-08 LAB — URINE CULTURE

## 2014-10-11 ENCOUNTER — Ambulatory Visit (INDEPENDENT_AMBULATORY_CARE_PROVIDER_SITE_OTHER)
Admission: RE | Admit: 2014-10-11 | Discharge: 2014-10-11 | Disposition: A | Discharge status: Home / Self Care | Payer: PPO | Source: Ambulatory Visit | Attending: Family Medicine | Admitting: Family Medicine

## 2014-10-11 ENCOUNTER — Encounter: Payer: Self-pay | Admitting: Family Medicine

## 2014-10-11 ENCOUNTER — Ambulatory Visit (INDEPENDENT_AMBULATORY_CARE_PROVIDER_SITE_OTHER): Payer: PPO | Admitting: Family Medicine

## 2014-10-11 VITALS — BP 104/58 | HR 65 | Temp 97.5°F | Wt 193.2 lb

## 2014-10-11 DIAGNOSIS — R319 Hematuria, unspecified: Secondary | ICD-10-CM

## 2014-10-11 DIAGNOSIS — R300 Dysuria: Secondary | ICD-10-CM

## 2014-10-11 LAB — POCT URINALYSIS DIPSTICK
Bilirubin, UA: NEGATIVE
Blood, UA: NEGATIVE
GLUCOSE UA: NEGATIVE
KETONES UA: NEGATIVE
Leukocytes, UA: NEGATIVE
Nitrite, UA: NEGATIVE
Protein, UA: NEGATIVE
Urobilinogen, UA: 4
pH, UA: 5.5

## 2014-10-11 NOTE — Patient Instructions (Signed)
Go to the lab on the way out.  We'll contact you with your lab and xray report.

## 2014-10-11 NOTE — Progress Notes (Signed)
Pre visit review using our clinic review tool, if applicable. No additional management support is needed unless otherwise documented below in the visit note.  Noted blood in urine 3 days ago.  1 episode.  No other episodes in the meantime.  Bright red blood in urine at the time.   H/o stones noted, h/o lithotripsy.   He had some burning at the time of this last episode, but none since.   He didn't note that he passed a stone.  No burning or urinary sx now.  No FCNAVD.  Has L knee replacement on 10/21/14 pending per Sampson Si.    Meds, vitals, and allergies reviewed.   ROS: See HPI.  Otherwise, noncontributory.  GEN: nad, alert and oriented HEENT: mucous membranes moist NECK: supple w/o LA CV: rrr. PULM: ctab, no inc wob ABD: soft, +bs EXT: no edema

## 2014-10-12 NOTE — Assessment & Plan Note (Signed)
Isolated hematuria, check KUB today along with ucx.  U/a today unremarkable.  He may have passed a stone fragment.   I don't see a reason to cancel his surgery.  Will forward notes to ortho as a FYI.   Should ucx be positive, we'll address but I don't think it is likely to be positive.

## 2014-10-13 LAB — URINE CULTURE
COLONY COUNT: NO GROWTH
ORGANISM ID, BACTERIA: NO GROWTH

## 2014-10-14 ENCOUNTER — Encounter: Payer: Self-pay | Admitting: Podiatry

## 2014-10-14 ENCOUNTER — Ambulatory Visit (INDEPENDENT_AMBULATORY_CARE_PROVIDER_SITE_OTHER): Payer: PPO | Admitting: Podiatry

## 2014-10-14 VITALS — BP 133/76 | HR 65 | Resp 16 | Ht 69.0 in | Wt 190.0 lb

## 2014-10-14 DIAGNOSIS — M79676 Pain in unspecified toe(s): Secondary | ICD-10-CM

## 2014-10-14 DIAGNOSIS — B351 Tinea unguium: Secondary | ICD-10-CM

## 2014-10-14 NOTE — Patient Instructions (Addendum)
For athletes foot- use lotrimin cream For over the counter treatment for nail fungus use fungi-nail. If you desire prescription treatment, please let me know.  Good luck with your knee surgery.   Diabetes and Foot Care Diabetes may cause you to have problems because of poor blood supply (circulation) to your feet and legs. This may cause the skin on your feet to become thinner, break easier, and heal more slowly. Your skin may become dry, and the skin may peel and crack. You may also have nerve damage in your legs and feet causing decreased feeling in them. You may not notice minor injuries to your feet that could lead to infections or more serious problems. Taking care of your feet is one of the most important things you can do for yourself.  HOME CARE INSTRUCTIONS  Wear shoes at all times, even in the house. Do not go barefoot. Bare feet are easily injured.  Check your feet daily for blisters, cuts, and redness. If you cannot see the bottom of your feet, use a mirror or ask someone for help.  Wash your feet with warm water (do not use hot water) and mild soap. Then pat your feet and the areas between your toes until they are completely dry. Do not soak your feet as this can dry your skin.  Apply a moisturizing lotion or petroleum jelly (that does not contain alcohol and is unscented) to the skin on your feet and to dry, brittle toenails. Do not apply lotion between your toes.  Trim your toenails straight across. Do not dig under them or around the cuticle. File the edges of your nails with an emery board or nail file.  Do not cut corns or calluses or try to remove them with medicine.  Wear clean socks or stockings every day. Make sure they are not too tight. Do not wear knee-high stockings since they may decrease blood flow to your legs.  Wear shoes that fit properly and have enough cushioning. To break in new shoes, wear them for just a few hours a day. This prevents you from injuring your  feet. Always look in your shoes before you put them on to be sure there are no objects inside.  Do not cross your legs. This may decrease the blood flow to your feet.  If you find a minor scrape, cut, or break in the skin on your feet, keep it and the skin around it clean and dry. These areas may be cleansed with mild soap and water. Do not cleanse the area with peroxide, alcohol, or iodine.  When you remove an adhesive bandage, be sure not to damage the skin around it.  If you have a wound, look at it several times a day to make sure it is healing.  Do not use heating pads or hot water bottles. They may burn your skin. If you have lost feeling in your feet or legs, you may not know it is happening until it is too late.  Make sure your health care provider performs a complete foot exam at least annually or more often if you have foot problems. Report any cuts, sores, or bruises to your health care provider immediately. SEEK MEDICAL CARE IF:   You have an injury that is not healing.  You have cuts or breaks in the skin.  You have an ingrown nail.  You notice redness on your legs or feet.  You feel burning or tingling in your legs or feet.  You have pain or cramps in your legs and feet.  Your legs or feet are numb.  Your feet always feel cold. SEEK IMMEDIATE MEDICAL CARE IF:   There is increasing redness, swelling, or pain in or around a wound.  There is a red line that goes up your leg.  Pus is coming from a wound.  You develop a fever or as directed by your health care provider.  You notice a bad smell coming from an ulcer or wound. Document Released: 09/21/2000 Document Revised: 05/27/2013 Document Reviewed: 03/03/2013 [facility] Patient Information 2015 [place], [place]. This information is not intended to replace advice given to you by your health care provider. Make sure you discuss any questions you have with your health care provider.

## 2014-10-14 NOTE — Progress Notes (Signed)
   Subjective:    Patient ID: Ronnie Johnson, male    DOB: 1941-08-31, 74 y.o.   MRN: [id number]  HPI 74 year old male presents the office they with his wife for complaints of painful, elongated, thick toenails. He states that her nails are painful particularly shoe gear. Patient is diabetic and he states his blood sugars typically runs 140. He denies any history of ulceration. He denies any intermittent claudication symptoms. He does have a history of a DVT on the right lower extremity. He points to be undergoing a left total knee replacement next week. No other complaints at this time.   Review of Systems  Musculoskeletal:       Joint pain  All other systems reviewed and are negative.      Objective:   Physical Exam AAO 3, NAD DP/PT pulses palpable, CRT less than 3 seconds Protective sensation appears to be intact with Derrel Nip monitor,, vibratory sensation intact, Achilles tendon reflex intact. There is chronic venous insufficiency changes to the right lower extremity with hemosiderin deposits. Nails hypertrophic, dystrophic, elongated, brittle, discolored 10. No surrounding erythema or drainage from the nail sites. There is dry, peeling, scaly skin the bilateral plantar feet which is consistent with tinea pedis. Subjectively the patient states the area itches. No open lesions or pre-ulcer lesions to bilateral lower extremity. No areas of pinpoint bony tenderness or pain with vibratory sensation bilaterally. No pain with calf compression, swelling, warmth, erythema.     Assessment & Plan:  74 year old male was sent to medical onychomycosis, likely tinea pedis.  -Treatment options were discussed the patient including alternatives, risks, complications.  -Nail sharply debrided 10 without complication/bleeding.  -He was inquiring about treatment for nail fungus. Discussed various treatment options for onychomycosis. This time the patient has elected to proceed with  over-the-counter treatment. Discussed fungi-nail and directed on use.  -For tinea pedis the infection appears to be mild. Recommended over-the-counter Lotrimin cream.  -Discussed the importance of daily foot inspection. -Follow-up in 3 months or sooner should any problems arise. In the meantime encouraged to call the office with any questions, concerns, change in symptoms.

## 2014-10-21 ENCOUNTER — Inpatient Hospital Stay: Payer: Self-pay | Admitting: Orthopedic Surgery

## 2014-10-22 LAB — BASIC METABOLIC PANEL
Anion Gap: 4 — ABNORMAL LOW (ref 7–16)
BUN: 7 mg/dL (ref 7–18)
Calcium, Total: 7.8 mg/dL — ABNORMAL LOW (ref 8.5–10.1)
Chloride: 105 mmol/L (ref 98–107)
Co2: 28 mmol/L (ref 21–32)
Creatinine: 0.71 mg/dL (ref 0.60–1.30)
EGFR (African American): >60
GLUCOSE: 155 mg/dL — AB (ref 65–99)
OSMOLALITY: 275 (ref 275–301)
Potassium: 4.1 mmol/L (ref 3.5–5.1)
SODIUM: 137 mmol/L (ref 136–145)

## 2014-10-22 LAB — PLATELET COUNT: Platelet: 134 10*3/uL — ABNORMAL LOW (ref 150–440)

## 2014-10-22 LAB — HEMOGLOBIN: HGB: 12.1 g/dL — ABNORMAL LOW (ref 13.0–18.0)

## 2014-10-23 LAB — HEMOGLOBIN: HGB: 12.1 g/dL — ABNORMAL LOW (ref 13.0–18.0)

## 2014-10-27 ENCOUNTER — Ambulatory Visit: Payer: Self-pay | Admitting: Orthopedic Surgery

## 2014-10-30 ENCOUNTER — Other Ambulatory Visit: Payer: Self-pay | Admitting: Family Medicine

## 2014-10-30 DIAGNOSIS — Z125 Encounter for screening for malignant neoplasm of prostate: Secondary | ICD-10-CM

## 2014-10-30 DIAGNOSIS — E119 Type 2 diabetes mellitus without complications: Secondary | ICD-10-CM

## 2014-11-02 ENCOUNTER — Other Ambulatory Visit (INDEPENDENT_AMBULATORY_CARE_PROVIDER_SITE_OTHER): Payer: PPO

## 2014-11-02 DIAGNOSIS — Z125 Encounter for screening for malignant neoplasm of prostate: Secondary | ICD-10-CM

## 2014-11-02 DIAGNOSIS — E119 Type 2 diabetes mellitus without complications: Secondary | ICD-10-CM

## 2014-11-02 LAB — COMPREHENSIVE METABOLIC PANEL
ALK PHOS: 50 U/L (ref 39–117)
ALT: 19 U/L (ref 0–53)
AST: 19 U/L (ref 0–37)
Albumin: 3.5 g/dL (ref 3.5–5.2)
BUN: 15 mg/dL (ref 6–23)
CALCIUM: 9 mg/dL (ref 8.4–10.5)
CO2: 28 mEq/L (ref 19–32)
Chloride: 102 mEq/L (ref 96–112)
Creatinine, Ser: 0.71 mg/dL (ref 0.40–1.50)
GFR: 115.69 mL/min (ref >60.00)
Glucose, Bld: 183 mg/dL — ABNORMAL HIGH (ref 70–99)
Potassium: 4.7 mEq/L (ref 3.5–5.1)
Sodium: 140 mEq/L (ref 135–145)
TOTAL PROTEIN: 6.1 g/dL (ref 6.0–8.3)
Total Bilirubin: 0.7 mg/dL (ref 0.2–1.2)

## 2014-11-02 LAB — LIPID PANEL
CHOLESTEROL: 86 mg/dL (ref 0–200)
HDL: 34.1 mg/dL — ABNORMAL LOW (ref >39.00)
LDL CALC: 29 mg/dL (ref 0–99)
NONHDL: 51.9
Total CHOL/HDL Ratio: 3
Triglycerides: 115 mg/dL (ref 0.0–149.0)
VLDL: 23 mg/dL (ref 0.0–40.0)

## 2014-11-02 LAB — PSA, MEDICARE: PSA: 1.14 ng/ml (ref 0.10–4.00)

## 2014-11-02 LAB — HEMOGLOBIN: HEMOGLOBIN: 12.6 g/dL — AB (ref 13.0–17.0)

## 2014-11-03 ENCOUNTER — Other Ambulatory Visit (INDEPENDENT_AMBULATORY_CARE_PROVIDER_SITE_OTHER): Payer: PPO

## 2014-11-03 DIAGNOSIS — E119 Type 2 diabetes mellitus without complications: Secondary | ICD-10-CM

## 2014-11-03 LAB — HEMOGLOBIN A1C: Hgb A1c MFr Bld: 8.1 % — ABNORMAL HIGH (ref 4.6–6.5)

## 2014-11-05 ENCOUNTER — Ambulatory Visit: Payer: PPO | Admitting: Internal Medicine

## 2014-11-09 ENCOUNTER — Encounter: Payer: Self-pay | Admitting: Family Medicine

## 2014-11-09 ENCOUNTER — Ambulatory Visit (INDEPENDENT_AMBULATORY_CARE_PROVIDER_SITE_OTHER): Payer: PPO | Admitting: Family Medicine

## 2014-11-09 VITALS — BP 108/56 | HR 78 | Temp 97.5°F | Ht 69.0 in | Wt 183.5 lb

## 2014-11-09 DIAGNOSIS — Z23 Encounter for immunization: Secondary | ICD-10-CM

## 2014-11-09 DIAGNOSIS — Z7189 Other specified counseling: Secondary | ICD-10-CM

## 2014-11-09 DIAGNOSIS — E785 Hyperlipidemia, unspecified: Secondary | ICD-10-CM

## 2014-11-09 DIAGNOSIS — I10 Essential (primary) hypertension: Secondary | ICD-10-CM

## 2014-11-09 DIAGNOSIS — Z96652 Presence of left artificial knee joint: Secondary | ICD-10-CM

## 2014-11-09 DIAGNOSIS — Z0000 Encounter for general adult medical examination without abnormal findings: Secondary | ICD-10-CM

## 2014-11-09 DIAGNOSIS — E119 Type 2 diabetes mellitus without complications: Secondary | ICD-10-CM

## 2014-11-09 MED ORDER — CARVEDILOL 6.25 MG PO TABS: 6.2500 mg | ORAL_TABLET | Freq: Two times a day (BID) | ORAL | Status: DC

## 2014-11-09 MED ORDER — LISINOPRIL 10 MG PO TABS: 10.0000 mg | ORAL_TABLET | Freq: Every day | ORAL | Status: DC

## 2014-11-09 MED ORDER — METFORMIN HCL 500 MG PO TABS: 500.0000 mg | ORAL_TABLET | Freq: Two times a day (BID) | ORAL | Status: DC

## 2014-11-09 MED ORDER — ROSUVASTATIN CALCIUM 20 MG PO TABS: 20.0000 mg | ORAL_TABLET | Freq: Every day | ORAL | Status: DC

## 2014-11-09 MED ORDER — GLUCOSE BLOOD VI STRP: ORAL_STRIP | Status: DC

## 2014-11-09 NOTE — Progress Notes (Signed)
Pre visit review using our clinic review tool, if applicable. No additional management support is needed unless otherwise documented below in the visit note.  I have personally reviewed the Medicare Annual Wellness questionnaire and have noted 1. The patient's medical and social history 2. Their use of alcohol, tobacco or illicit drugs 3. Their current medications and supplements 4. The patient's functional ability including ADL's, fall risks, home safety risks and hearing or visual             impairment. 5. Diet and physical activities 6. Evidence for depression or mood disorders  The patients weight, height, BMI have been recorded in the chart and visual acuity is per eye clinic.  I have made referrals, counseling and provided education to the patient based review of the above and I have provided the pt with a written personalized care plan for preventive services.  Provider list updated- see scanned forms.  Routine anticipatory guidance given to patient.  See health maintenance.  Flu 2015 Shingles prev done PNA 2011 Tetanus 2002 Colonoscopy 2015 Prostate cancer screening- psa wnl 2016 Advance directive- wife designated if patient were incapacitated.  Cognitive function addressed- see scanned forms- and if abnormal then additional documentation follows.   Hypertension:    Using medication without problems or lightheadedness: yes Chest pain with exertion:no Edema:occ BLE edema, limited, at baseline Short of breath:no  Elevated Cholesterol: Using medications without problems:yes Muscle aches: not from statin Diet compliance:yes Exercise:mainly PT for L knee surgery  L knee s/p replacement. Doing well with PT.  On bowel regimen.  Mood was low due to relative disability and pain, but clearly improved recently- he admits the improvement.  D/w pt.  He had concerns about anticoagulation.  He should be fine off it at this point, but I directed him back to ortho for their input.  He  agrees.   Diabetes:  Using medications without difficulties:yes Hypoglycemic episodes:no Hyperglycemic episodes:no Feet problems:no Blood Sugars averaging: ~150 recently.   eye exam within last year:yes, 01/2014 per patient  PMH and SH reviewed  Meds, vitals, and allergies reviewed.   ROS: See HPI.  Otherwise negative.    GEN: nad, alert and oriented HEENT: mucous membranes moist NECK: supple w/o LA CV: rrr. PULM: ctab, no inc wob ABD: soft, +bs EXT: no edema SKIN: no acute rash L knee incision clean and dry No DMF exam done since patient is in compression stockings.

## 2014-11-09 NOTE — Patient Instructions (Addendum)
I sent in your meds.   Take care.  Recheck in about 3-4 months, labs ahead of time.  Glad to see you.  Keep working on PT.

## 2014-11-10 DIAGNOSIS — Z7189 Other specified counseling: Secondary | ICD-10-CM | POA: Insufficient documentation

## 2014-11-10 DIAGNOSIS — Z96659 Presence of unspecified artificial knee joint: Secondary | ICD-10-CM | POA: Insufficient documentation

## 2014-11-10 NOTE — Assessment & Plan Note (Signed)
Good control, no change in meds.  Labs d/w pt.  He agrees.

## 2014-11-10 NOTE — Assessment & Plan Note (Signed)
Controlled, not lightheaded, continue as is with meds.  He agrees.

## 2014-11-10 NOTE — Assessment & Plan Note (Signed)
Flu 2015  Shingles prev done  PNA 2011  Tetanus 2002  Colonoscopy 2015  Prostate cancer screening- psa wnl 2016  Advance directive- wife designated if patient were incapacitated.  Cognitive function addressed- see scanned forms- and if abnormal then additional documentation follows.

## 2014-11-10 NOTE — Assessment & Plan Note (Signed)
Mood and pain control improved, continue with PT, continue bowel regimen.  See above about directing patient back to ortho for his question.

## 2014-11-10 NOTE — Assessment & Plan Note (Signed)
Reasonable A1c for now, he'll get back to more exercise as his knee improves and he'll continue work on diet,  He agrees.  Recheck in a few months, sooner if needed.

## 2014-12-07 ENCOUNTER — Encounter: Payer: Self-pay | Admitting: Cardiovascular Disease

## 2014-12-07 ENCOUNTER — Ambulatory Visit (INDEPENDENT_AMBULATORY_CARE_PROVIDER_SITE_OTHER): Payer: PPO | Admitting: Cardiovascular Disease

## 2014-12-07 VITALS — BP 110/58 | HR 70 | Ht 69.0 in | Wt 184.0 lb

## 2014-12-07 DIAGNOSIS — I2510 Atherosclerotic heart disease of native coronary artery without angina pectoris: Secondary | ICD-10-CM

## 2014-12-07 DIAGNOSIS — E785 Hyperlipidemia, unspecified: Secondary | ICD-10-CM

## 2014-12-07 DIAGNOSIS — E669 Obesity, unspecified: Secondary | ICD-10-CM

## 2014-12-07 DIAGNOSIS — E119 Type 2 diabetes mellitus without complications: Secondary | ICD-10-CM

## 2014-12-07 DIAGNOSIS — Z96652 Presence of left artificial knee joint: Secondary | ICD-10-CM

## 2014-12-07 DIAGNOSIS — R21 Rash and other nonspecific skin eruption: Secondary | ICD-10-CM

## 2014-12-07 DIAGNOSIS — I10 Essential (primary) hypertension: Secondary | ICD-10-CM

## 2014-12-07 NOTE — Progress Notes (Signed)
Patient ID: Ronnie Johnson, male    DOB: 05/17/1941, 74 y.o.   MRN: [id number]  HPI Comments: 74 yo with history of CAD s/p MI in April 2010 and DVT right leg in January 2009, hypertension, hyperlipidemia,presents for followup of his coronary artery disease.  In follow-up today, he reports having rare episodes of chest pain. They last for several minutes at a time, typically come on at rest. No reproducible chest pain with exertion. Otherwise active at baseline. Recent lab work reviewed with him showing total cholesterol 86, hemoglobin 12.6 He has had a rash, seen by dermatology. He takes Crestor 10 mg daily, lisinopril 5 mg daily Recent total knee replacement on the left, working with PT 3 days per week, little other activity  Previous Hemoglobin A1c  7 range. Not watching his diet closely. Weight continues to be a problem. Denies any shortness of breath, no near syncope or syncope  EKG on today's visit was challenging as he had applied moisturizer over his body for his rash. Only several leads obtained. This showed normal sinus rhythm. Possible T wave inversions in the inferior leads   MI  April 2010.   Myoview showing a large lateral defect from a base to apex, that was mainly fixed with some peri-infarct ischemia. EF was 42%.    left heart catheterization in May 2010, that showed EF of 55%, left ventricular end-diastolic pressure of 21 mmHg, and on coronary angiography, there were separate ostia to the LAD and the circumflex. There was a 30-40% ostial LAD stenosis and 40% first diagonal stenosis. The first obtuse marginal was totally occluded. There was a 75% stenosis in the AV circumflex after the first obtuse marginal. There was a 70% ostial stenosis in the second obtuse marginal. There was an 80% stenosis in the circumflex after the second obtuse marginal (the circumflex was a small vessel at that point). It was decided to manage the patient medically.      Allergies  Allergen  Reactions  . Lipitor [Atorvastatin Calcium]     Tolerates crestor  . Tape   . Latex Rash    Outpatient Encounter Prescriptions as of 12/07/2014  Medication Sig  . carvedilol (COREG) 6.25 MG tablet Take 1 tablet (6.25 mg total) by mouth 2 (two) times daily with a meal.  . esomeprazole (NEXIUM) 20 MG capsule Take 20 mg by mouth daily as needed (HEARTBURN).  Marland Kitchen glucose blood test strip Use daily as instructed.  Dx E11.9  . HYDROcodone-acetaminophen (NORCO) 10-325 MG per tablet Take 1 tablet by mouth every 6 (six) hours as needed.  Marland Kitchen lisinopril (PRINIVIL,ZESTRIL) 10 MG tablet Take 1 tablet (10 mg total) by mouth daily.  . metFORMIN (GLUCOPHAGE) 500 MG tablet Take 1 tablet (500 mg total) by mouth 2 (two) times daily with a meal.  . rosuvastatin (CRESTOR) 20 MG tablet Take 1 tablet (20 mg total) by mouth daily.    Past Medical History  Diagnosis Date  . Hypertension   . DVT of leg (deep venous thrombosis) 2009  . Nephrolithiasis   . Hyperlipidemia   . Allergic rhinitis   . CAD (coronary artery disease)   . Urethral diverticulum   . Varicose vein of leg   . Dupuytren's contracture   . ED (erectile dysfunction)   . History of colonic polyps   . GERD (gastroesophageal reflux disease)   . Diverticulosis of colon   . Diabetes mellitus, type 2   . Myocardial infarction   . SCC (squamous cell carcinoma)  scalp 2013 per derm  . DVT (deep venous thrombosis)   . Polio 1948    was hospitalized for 1 year    Past Surgical History  Procedure Laterality Date  . Appendectomy  1994  . Umbilical hernia repair  03/14/2005  . 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 Replacement total knee Left 2016    Social History  reports that he quit smoking about 26 years ago. He has never used smokeless tobacco. He reports that he drinks alcohol. He reports that he does not use illicit drugs.  Family History family history includes Alzheimer's disease in his mother; Diabetes in his brother,  mother, and sister; Heart attack in his father; Hypertension in his brother, daughter, father, and mother; Prostate cancer in his brother; Stroke in his mother. There is no history of Colon cancer.   Review of Systems  Constitutional: Negative.   Respiratory: Negative.   Cardiovascular: Positive for chest pain.  Gastrointestinal: Negative.   Musculoskeletal: Positive for arthralgias.  Skin: Negative.   Neurological: Negative.   Hematological: Negative.   Psychiatric/Behavioral: Negative.   All other systems reviewed and are negative.   BP 110/58 mmHg  Pulse 70  Ht 5\' 9"  (1.753 m)  Wt 184 lb (83.462 kg)  BMI 27.16 kg/m2  Physical Exam  Constitutional: He is oriented to person, place, and time. He appears well-developed and well-nourished.  obese  HENT:  Head: Normocephalic.  Nose: Nose normal.  Mouth/Throat: Oropharynx is clear and moist.  Eyes: Conjunctivae are normal. Pupils are equal, round, and reactive to light.  Neck: Normal range of motion. Neck supple. No JVD present.  Cardiovascular: Normal rate, regular rhythm, S1 normal, S2 normal, normal heart sounds and intact distal pulses.  Exam reveals no gallop and no friction rub.   No murmur heard. Pulmonary/Chest: Effort normal and breath sounds normal. No respiratory distress. He has no wheezes. He has no rales. He exhibits no tenderness.  Abdominal: Soft. Bowel sounds are normal. He exhibits no distension. There is no tenderness.  Musculoskeletal: Normal range of motion. He exhibits no edema or tenderness.  Lymphadenopathy:    He has no cervical adenopathy.  Neurological: He is alert and oriented to person, place, and time. Coordination normal.  Skin: Skin is warm and dry. No rash noted. No erythema.  Psychiatric: He has a normal mood and affect. His behavior is normal. Judgment and thought content normal.      Assessment and Plan   Nursing note and vitals reviewed.

## 2014-12-07 NOTE — Patient Instructions (Addendum)
You are doing well. No medication changes were made.  Please call the office if have worsening chest pain, We would order a stress test  Please call us if you have new issues that need to be addressed before your next appt.  Your physician wants you to follow-up in: 6 months.  You will receive a reminder letter in the mail two months in advance. If you don't receive a letter, please call our office to schedule the follow-up appointment.

## 2014-12-07 NOTE — Assessment & Plan Note (Signed)
He is doing minimal activity apart from physical therapy 3 days per week. Recommended he start swimming, low-grade exercise at the local gym. He has a Higher education careers adviser

## 2014-12-07 NOTE — Assessment & Plan Note (Signed)
Blood pressure is well controlled on today's visit. No changes made to the medications. 

## 2014-12-07 NOTE — Assessment & Plan Note (Signed)
We have encouraged continued exercise, careful diet management in an effort to lose weight. 

## 2014-12-07 NOTE — Assessment & Plan Note (Signed)
Previously seen by dermatology. Continues to have diffuse rash on his left trunk

## 2014-12-07 NOTE — Assessment & Plan Note (Signed)
Recent atypical chest pains. We discussed possible workup. He will monitor his symptoms for now and call our office if this gets worse. Catheterization or stress test could be performed

## 2014-12-23 ENCOUNTER — Ambulatory Visit (INDEPENDENT_AMBULATORY_CARE_PROVIDER_SITE_OTHER): Payer: PPO | Admitting: Podiatry

## 2014-12-23 DIAGNOSIS — B351 Tinea unguium: Secondary | ICD-10-CM

## 2014-12-23 DIAGNOSIS — M79676 Pain in unspecified toe(s): Secondary | ICD-10-CM | POA: Diagnosis not present

## 2014-12-23 NOTE — Progress Notes (Signed)
Subjective: 74 y.o.-year-old male returns the office today for painful, elongated, thickened toenails which she is unable to trim himself. Denies any redness or drainage around the nails. Denies any acute changes since last appointment and no new complaints today. Denies any systemic complaints such as fevers, chills, nausea, vomiting.   Objective: AAO 3, NAD DP/PT pulses palpable, CRT less than 3 seconds Protective sensation intact with Simms Weinstein monofilament, Achilles tendon reflex intact.  Nails hypertrophic, dystrophic, elongated, brittle, discolored 10. There is tenderness overlying the nails 1-5 bilaterally. There is no surrounding erythema or drainage along the nail sites. No open lesions or pre-ulcerative lesions are identified. No other areas of tenderness bilateral lower extremities. No overlying edema, erythema, increased warmth. No pain with calf compression, swelling, warmth, erythema.  Assessment: Patient presents with symptomatic onychomycosis  Plan: -Treatment options including alternatives, risks, complications were discussed -Nails sharply debrided 10 without complication/bleeding. -Discussed daily foot inspection. If there are any changes, to call the office immediately.  -Follow-up in 3 months or sooner if any problems are to arise. In the meantime, encouraged to call the office with any questions, concerns, changes symptoms.

## 2015-01-17 ENCOUNTER — Telehealth: Payer: Self-pay | Admitting: Family Medicine

## 2015-01-17 NOTE — Telephone Encounter (Signed)
Pt's wife called and is questioning the billing of $15.00 copay, Best number to call is [phone] / [phone]

## 2015-01-17 NOTE — Telephone Encounter (Signed)
Left mssg on pt's wife's cell # to return my call.

## 2015-01-31 LAB — SURGICAL PATHOLOGY

## 2015-02-06 NOTE — Discharge Summary (Signed)
PATIENT NAME:  Ronnie Johnson, KNOBEL MR#:  [id number] DATE OF BIRTH:  May 12, 1941  DATE OF ADMISSION:  10/21/2014 DATE OF DISCHARGE:  10/24/2014  ADMITTING DIAGNOSIS: Left knee osteoarthritis.   DISCHARGE DIAGNOSIS: Left knee osteoarthritis.   PROCEDURE: Left total knee replacement.   ANESTHESIA: Spinal.  SURGEON: Laurene Footman, M.D.   ASSISTANT: Rachelle Hora, PA-C.   HISTORY: The patient is a 75 year old man who suffered with left knee osteoarthritis for quite some time. Pain has progressively gotten worse and limited his activities of daily living. The patient has failed all nonoperative treatment options. Pain is 10/10.   PHYSICAL EXAMINATION:   GENERAL: Well-developed, well-nourished male in no apparent distress. Normal affect. Antalgic gait.  HEART: Regular rate and rhythm.  LUNGS: Clear to auscultation bilaterally. No wheezing, rales, or rhonchi.  HEENT: Normal.  LEFT LOWER EXTREMITY: Examination of left lower extremity shows the patient has tenderness along the medial  and lateral joint line. He has full range of motion. He has no laxity with valgus and varus stress testing. He has a negative Homans sign. He is neurovascularly intact in the left lower extremity.   HOSPITAL COURSE: The patient was admitted to the hospital on 10/21/2014.  He had surgery that same day and was brought to the orthopedic floor from the PACU in stable condition. On postoperative day one, the patient had acute postoperative blood loss anemia with a hemoglobin of 12.1. On postoperative day two, hemoglobin was stable at 12.1. Vital signs are stable. He made good progress with physical therapy. By postoperative day three, the patient was stable and ready for discharge to home with home health PT.   CONDITION AT DISCHARGE: Stable.   DISCHARGE INSTRUCTIONS: The patient may gradually increase weight-bearing on the affected extremity. Elevate the affected foot or leg on 1 or 2 pillows with the foot higher than the  knee. Thigh-high TED hose on both legs and remove at bedtime and replace on arising the next morning. Elevate the heels off the bed. Incentive spirometer every hour while awake. Encourage cough and deep breathing. The patient may resume a regular diet as tolerated. Continue using Polar Care unit maintaining the temperature between 40 to 50 degrees do not get  bandage wet or dirty. Call [facility] orthopedics if the dressing gets water under it. Leave the dressing on. Call [facility] orthopedics if any of the following occur: Bright red bleeding from the incision wound, fever above 101.5 degrees, redness, swelling, or drainage at the incision site. Call [facility] orthopedics if you experience any increased leg pain, numbness, weakness in legs or bowel or bladder symptoms. Home physical therapy has been arranged for continuation of rehab program. Please call [facility] if a therapist has not contacted you within 48 hours of your return home. Please call [facility] orthopedics for follow-up appointment. The patient needs to be seen at [facility] orthopedics in two weeks.   DISCHARGE MEDICATIONS: Please see list of discharge instructions for discharge medications.    ____________________________ T. Rachelle Hora, PA-C tcg:at D: 10/28/2014 09:42:43 ET T: 10/28/2014 10:12:17 ET JOB#: [id number]  cc: T. Rachelle Hora, PA-C, <Dictator> Duanne Guess [place] ELECTRONICALLY SIGNED 11/08/2014 8:42

## 2015-02-06 NOTE — Op Note (Signed)
PATIENT NAME:  Ronnie Johnson, Ronnie Johnson MR#:  [id number] DATE OF BIRTH:  10-28-1940  DATE OF PROCEDURE:  10/21/2014  PREOPERATIVE DIAGNOSIS: Left knee osteoarthritis.   POSTOPERATIVE DIAGNOSIS: Left knee osteoarthritis.   PROCEDURE: Left total knee replacement.   ANESTHESIA: Spinal.   SURGEON: Hessie Knows, M.D.   ASSISTANT:  Rachelle Hora, PA-C.   DESCRIPTION OF PROCEDURE: The patient was brought to the Operating Room and after adequate anesthesia was obtained, the left leg was prepped and draped in the usual sterile fashion. After patient identification and timeout procedures were completed, an anterior approach was made to the knee with parapatellar arthrotomy. Inspection of the knee revealed extensive synovitis with pitting of the articular cartilage. The most severe wear was in the patellofemoral joint but there was no normal cartilage. The anterior horns of the menisci were excised along with the anterior and posterior cruciates. The tibial cutting block was applied from the [address]  MyKnee system and the proximal tibia cut was carried out. The femur was exposed in a similar fashion and the distal femoral cut was made. With distal pins in place, the 3+ cutting guide was applied, anterior, posterior and chamfer cuts made. With the distal femoral resection carried out, the residual posterior horns of the menisci could be removed along with some additional resected bone posteriorly off the tibia. At this point, the tibia template was applied, a size 3 was pinned in place. Because of soft bone, a stem was deemed necessary, proximal reaming carried out for this, then the keel punch. Next, the femur trial was placed; it was a 10 mm insert. There was excellent stability and range of motion with good patellofemoral tracking. The trial components were removed and the notch cut made in the trochlea of the femur. Next, the patella was cut using the patellar cutting guide and sized to a size 3 after drill holes were  made. At this point, the periarticular tissue was infiltrated with a dilute mixture of Exparel as well as a combination of morphine and 0.25% Sensorcaine with epinephrine.   The tourniquet was raised at this point and the bony surfaces thoroughly irrigated and dried. The tibial component was cemented into place first, followed by displacement of the tibial insert. Femoral component was then cemented into place and the knee held in extension as the cement  set along with cementing in the polyethylene component for the patella using a clamp.   After the cement had set and excess cement had been removed, the knee was irrigated with Betadine lavage, a diluted with Betadine lavage to aid in infection prevention. The tourniquet was let down and hemostasis checked with electrocautery. The arthrotomy was repaired using a heavy Quill, 2-0 Quill subcutaneously and skin staples. Xeroform, 4 x 4's, ABD, Webril, Polar Care and Ace wrap were applied along with knee immobilizer.   The patient was sent to the recovery room in stable condition.   ESTIMATED BLOOD LOSS: 200.   TOURNIQUET TIME: 27 minutes at 300 mmHg  IMPLANTS: Medacta GMK primary, size 3 patella, size 3 left fixed tibia tray, 3 left 10 mm flex tibial insert with a 11 x 65 mm stem on the tibial baseplate with a 3+ left, severe femoral component.   SPECIMENS: Cut ends of bone.   There were no complications.    ____________________________ Laurene Footman, MD mjm:ST D: 10/21/2014 20:31:50 ET T: 10/21/2014 23:14:54 ET JOB#: [id number]  cc: Laurene Footman, MD, <Dictator> Laurene Footman MD ELECTRONICALLY SIGNED 10/22/2014 8:43

## 2015-02-23 ENCOUNTER — Telehealth: Payer: Self-pay

## 2015-02-23 NOTE — Telephone Encounter (Signed)
Spoke w/ pt's wife.  She reports that [place] are [place] the [place] and she is concerned that he may have a blockage.  She reports that pt has been napping more during the day and seems to have a decreased appetite.  She states that pt seems to sleep well at night and does not have excessive snoring. She reports that he ate about 3/4 of his biscuit one morning and said he was full and this is out of character for him.  She asks if she should take pt to the local ED, as they only have emergency ins cover out of town.  She reports that pt denies chest pain, SOB, diaphoresis, dizziness, n/v, or any other symptom. Discussed w/ her that if she is concerned about pt's excessive napping or loss of appetite, she should speak w/ PCP, as pt does not seem to be having any cardiac sx. She verbalizes understanding and will take pt to ED if his sx become emergent.

## 2015-03-15 ENCOUNTER — Ambulatory Visit (INDEPENDENT_AMBULATORY_CARE_PROVIDER_SITE_OTHER): Payer: PPO | Admitting: Internal Medicine

## 2015-03-15 ENCOUNTER — Encounter: Payer: Self-pay | Admitting: Internal Medicine

## 2015-03-15 VITALS — BP 112/64 | HR 72 | Temp 97.8°F | Wt 182.0 lb

## 2015-03-15 DIAGNOSIS — E1165 Type 2 diabetes mellitus with hyperglycemia: Secondary | ICD-10-CM | POA: Diagnosis not present

## 2015-03-15 LAB — POCT CBG (FASTING - GLUCOSE)-MANUAL ENTRY: Glucose Fasting, POC: 189 mg/dL — AB (ref 70–99)

## 2015-03-15 NOTE — Progress Notes (Signed)
Pre visit review using our clinic review tool, if applicable. No additional management support is needed unless otherwise documented below in the visit note. 

## 2015-03-15 NOTE — Patient Instructions (Signed)

## 2015-03-15 NOTE — Progress Notes (Signed)
Subjective:    Patient ID: Ronnie Johnson, male    DOB: 11-Apr-1941, 74 y.o.   MRN: [id number]  HPI  Pt presents to the clinic today with c/o a elevated blood sugar. He has not check his blood sugar recently. He started checking it again after his recent vacation. His fasting sugars range 125-300. He reports feeling more fatigue and having increased urinary frequency. He is taking Metformin 500 mg BID. His last A1C was 8.1% (10/2014). He has had a change in his diet. He is eating more sweets, potatoes and bread.  Review of Systems      Past Medical History  Diagnosis Date  . Hypertension   . DVT of leg (deep venous thrombosis) 2009  . Nephrolithiasis   . Hyperlipidemia   . Allergic rhinitis   . CAD (coronary artery disease)   . Urethral diverticulum   . Varicose vein of leg   . Dupuytren's contracture   . ED (erectile dysfunction)   . History of colonic polyps   . GERD (gastroesophageal reflux disease)   . Diverticulosis of colon   . Diabetes mellitus, type 2   . Myocardial infarction   . SCC (squamous cell carcinoma)     scalp 2013 per derm  . DVT (deep venous thrombosis)   . Polio 1948    was hospitalized for 1 year    Current Outpatient Prescriptions  Medication Sig Dispense Refill  . carvedilol (COREG) 6.25 MG tablet Take 1 tablet (6.25 mg total) by mouth 2 (two) times daily with a meal. 180 tablet 3  . esomeprazole (NEXIUM) 20 MG capsule Take 20 mg by mouth daily as needed (HEARTBURN).    Marland Kitchen glucose blood test strip Use daily as instructed.  Dx E11.9 100 each 12  . HYDROcodone-acetaminophen (NORCO) 10-325 MG per tablet Take 1 tablet by mouth every 6 (six) hours as needed.    Marland Kitchen lisinopril (PRINIVIL,ZESTRIL) 10 MG tablet Take 1 tablet (10 mg total) by mouth daily. 90 tablet 3  . metFORMIN (GLUCOPHAGE) 500 MG tablet Take 1 tablet (500 mg total) by mouth 2 (two) times daily with a meal. 180 tablet 3  . rosuvastatin (CRESTOR) 20 MG tablet Take 1 tablet (20 mg total) by  mouth daily. 90 tablet 3   No current facility-administered medications for this visit.    Allergies  Allergen Reactions  . Lipitor [Atorvastatin Calcium]     Tolerates crestor  . Tape   . Latex Rash    Family History  Problem Relation Age of Onset  . Diabetes Mother   . Hypertension Mother   . Stroke Mother   . Alzheimer's disease Mother   . Heart attack Father   . Hypertension Father   . Prostate cancer Brother   . Diabetes Brother   . Hypertension Brother   . Diabetes Sister   . Colon cancer Neg Hx   . Hypertension Daughter     History   Social History  . Marital Status: Married    Spouse Name: N/A  . Number of Children: 2  . Years of Education: N/A   Occupational History  . RETIRED     Advice worker   Social History Main Topics  . Smoking status: Former Smoker    Quit date: 10/08/1988  . Smokeless tobacco: Never Used  . Alcohol Use: 0.0 oz/week    0 Standard drinks or equivalent per week     Comment: RARE  . Drug Use: No  . Sexual Activity: Not  on file   Other Topics Concern  . Not on file   Social History Narrative   Marital Status: Married 1976, second time   Children: 2 CHILDREN, 1 step daughter    Occupation: RETIRED Social worker   Tobacco Use - Former   Alcohol Use - yes - rarely   enjoys fishing     Constitutional: Pt reports fatigue. Denies fever, malaise, headache or abrupt weight changes.  Respiratory: Denies difficulty breathing, shortness of breath, cough or sputum production.   Cardiovascular: Denies chest pain, chest tightness, palpitations or swelling in the hands or feet.  GU: Pt reports urinary frequency. Denies urgency, pain with urination, burning sensation, blood in urine, odor or discharge. Skin: Denies redness, rashes, lesions or ulcercations.  Neurological: Denies numbness or tingling in feet, or problems with balance and coordination.   No other specific complaints in a complete review of  systems (except as listed in HPI above).  Objective:   Physical Exam   BP 112/64 mmHg  Pulse 72  Temp(Src) 97.8 F (36.6 C) (Oral)  Wt 182 lb (82.555 kg)  SpO2 97% Wt Readings from Last 3 Encounters:  03/15/15 182 lb (82.555 kg)  12/07/14 184 lb (83.462 kg)  11/09/14 183 lb 8 oz (83.235 kg)    General: Appears his stated age, well developed, well nourished in NAD. Skin: Warm, dry and intact. No rashes, lesions or ulcerations noted. Cardiovascular: Normal rate and rhythm. S1,S2 noted.  Murmur noted. Pulmonary/Chest: Normal effort and positive vesicular breath sounds. No respiratory distress. No wheezes, rales or ronchi noted.  Neurological: Alert and oriented. Sensation intact to BLE.   BMET    Component Value Date/Time   NA 140 11/02/2014 0815   NA 137 10/22/2014 0515   K 4.7 11/02/2014 0815   K 4.1 10/22/2014 0515   CL 102 11/02/2014 0815   CL 105 10/22/2014 0515   CO2 28 11/02/2014 0815   CO2 28 10/22/2014 0515   GLUCOSE 183* 11/02/2014 0815   GLUCOSE 155* 10/22/2014 0515   BUN 15 11/02/2014 0815   BUN 7 10/22/2014 0515   CREATININE 0.71 11/02/2014 0815   CREATININE 0.71 10/22/2014 0515   CALCIUM 9.0 11/02/2014 0815   CALCIUM 7.8* 10/22/2014 0515   GFRNONAA 77.96 08/21/2010 0919   GFRAA 109 05/28/2008 1007    Lipid Panel     Component Value Date/Time   CHOL 86 11/02/2014 0815   CHOL 106 01/28/2012 0858   TRIG 115.0 11/02/2014 0815   HDL 34.10* 11/02/2014 0815   HDL 44 01/28/2012 0858   CHOLHDL 3 11/02/2014 0815   CHOLHDL 2.4 01/28/2012 0858   VLDL 23.0 11/02/2014 0815   LDLCALC 29 11/02/2014 0815   LDLCALC 41 01/28/2012 0858    CBC    Component Value Date/Time   WBC 5.3 10/06/2014 0910   WBC 5.0 06/02/2009 0911   RBC 5.49 10/06/2014 0910   RBC 5.65 06/02/2009 0911   HGB 12.6* 11/02/2014 0815   HGB 12.1* 10/23/2014 0343   HCT 45.7 10/06/2014 0910   HCT 45.8 06/02/2009 0911   PLT 134* 10/22/2014 0515   PLT 133.0* 06/02/2009 0911   MCV 83  10/06/2014 0910   MCV 81.1 06/02/2009 0911   MCH 26.0 10/06/2014 0910   MCHC 31.3* 10/06/2014 0910   MCHC 33.1 06/02/2009 0911   RDW 14.9* 10/06/2014 0910   RDW 14.9* 06/02/2009 0911   LYMPHSABS 1.5 06/02/2009 0911   MONOABS 0.4 06/02/2009 0911   EOSABS 0.2 06/02/2009 0911  BASOSABS 0.0 06/02/2009 0911    Hgb A1C Lab Results  Component Value Date   HGBA1C 8.1* 11/03/2014        Assessment & Plan:   Hyperglycemia secondary to DM 2:  Will repeat A1C and BMET  today May need to consider increasing Metformin Eye exam UTD Foot exam today Flu, Pneumovax and Prevnar UTD No microalbumin needed because he is on ACE  Follow up with PCP as directed

## 2015-03-16 LAB — BASIC METABOLIC PANEL
BUN: 15 mg/dL (ref 6–23)
CO2: 27 mEq/L (ref 19–32)
Calcium: 9.1 mg/dL (ref 8.4–10.5)
Chloride: 106 mEq/L (ref 96–112)
Creatinine, Ser: 0.8 mg/dL (ref 0.40–1.50)
GFR: 100.7 mL/min (ref >60.00)
Glucose, Bld: 186 mg/dL — ABNORMAL HIGH (ref 70–99)
Potassium: 4.4 mEq/L (ref 3.5–5.1)
Sodium: 138 mEq/L (ref 135–145)

## 2015-03-16 LAB — HEMOGLOBIN A1C: Hgb A1c MFr Bld: 9.1 % — ABNORMAL HIGH (ref 4.6–6.5)

## 2015-03-17 MED ORDER — METFORMIN HCL 1000 MG PO TABS: 1000.0000 mg | ORAL_TABLET | Freq: Two times a day (BID) | ORAL | Status: DC

## 2015-03-17 NOTE — Addendum Note (Signed)
Addended by: Lurlean Nanny on: 03/17/2015 11:38 AM   Modules accepted: Orders, Medications

## 2015-03-21 ENCOUNTER — Ambulatory Visit (INDEPENDENT_AMBULATORY_CARE_PROVIDER_SITE_OTHER): Payer: PPO | Admitting: Family Medicine

## 2015-03-21 ENCOUNTER — Encounter: Payer: Self-pay | Admitting: Family Medicine

## 2015-03-21 VITALS — BP 104/64 | HR 76 | Temp 97.7°F | Wt 184.0 lb

## 2015-03-21 DIAGNOSIS — IMO0001 Reserved for inherently not codable concepts without codable children: Secondary | ICD-10-CM

## 2015-03-21 DIAGNOSIS — E1165 Type 2 diabetes mellitus with hyperglycemia: Secondary | ICD-10-CM

## 2015-03-21 MED ORDER — METFORMIN HCL 1000 MG PO TABS: ORAL_TABLET | ORAL | Status: DC

## 2015-03-21 NOTE — Assessment & Plan Note (Signed)
Dec to 1500mg  metformin if tolerated.  If not, then down to 1000mg .  Continue work on diet and weight.  Update me with sugars and status, we may need to add on another DM2 med.  He agrees.

## 2015-03-21 NOTE — Progress Notes (Signed)
Pre visit review using our clinic review tool, if applicable. No additional management support is needed unless otherwise documented below in the visit note.  DM2.  A1c up recently.  Inc in metformin to 2000mg  a day.  Now with diarrhea.  Was prev on 1000mg  a day, had tolerated that dose.  He has been checking sugar, was improved with higher dose of metformin.  His diet had been off recently, ie before A1c was drawn, d/w pt.    Meds, vitals, and allergies reviewed.   ROS: See HPI.  Otherwise, noncontributory.  nad ncat Mmm Neck supple, no LA rrr ctab abd soft, not ttp Ext w/o edema

## 2015-03-21 NOTE — Patient Instructions (Signed)
Cut back to 1500mg  of metformin if tolerated.  If you can't tolerate it then cut back to 1000mg .   Update me with your sugar in about 1 week.   We may need to add on an extra med.   Recheck A1c in 3 months, but we'll be in touch in the meantime.

## 2015-03-24 ENCOUNTER — Ambulatory Visit: Payer: PPO

## 2015-03-31 ENCOUNTER — Telehealth: Payer: Self-pay | Admitting: Family Medicine

## 2015-03-31 NOTE — Telephone Encounter (Signed)
Pt called in with fasting glucose readings: 6/14 166 6/15 170 6/16 196 6/17 199 6/18 160 6/19 180 6/20 163 6/21 195 6/22 199 6/23 182  Pt numbers are [phone] and cell [phone] [facility] [place]

## 2015-04-01 MED ORDER — GLIMEPIRIDE 2 MG PO TABS: 2.0000 mg | ORAL_TABLET | Freq: Every day | ORAL | Status: DC

## 2015-04-01 NOTE — Telephone Encounter (Signed)
I would add on glimepiride and update me in about 10 days with sugars.  rx sent.  Thanks.

## 2015-04-01 NOTE — Telephone Encounter (Signed)
Patient advised.

## 2015-04-07 ENCOUNTER — Telehealth: Payer: Self-pay | Admitting: *Deleted

## 2015-04-07 NOTE — Telephone Encounter (Signed)
Patient stopped the medication on Monday and has already improved with the leg stiffness.  However, his BS has slowly increased.  It had gotten down to 140 on the Glimepiride but now since being off of it for a few days, it is up to 160 this morning.

## 2015-04-07 NOTE — Telephone Encounter (Signed)
Stop it for a few days and then update me.  If clearly better off medicine, then I need to know about it.  Have him update me 04/12/15.   Thanks.

## 2015-04-07 NOTE — Telephone Encounter (Signed)
Pt's wife calling stating pt is having stiffness in his legs and they think it's related to the glimepiride, please advise

## 2015-04-08 ENCOUNTER — Other Ambulatory Visit: Payer: Self-pay | Admitting: *Deleted

## 2015-04-08 MED ORDER — SITAGLIPTIN PHOSPHATE 100 MG PO TABS: 100.0000 mg | ORAL_TABLET | Freq: Every day | ORAL | Status: DC

## 2015-04-08 NOTE — Telephone Encounter (Signed)
Wife advised.  [facility] [place].

## 2015-04-08 NOTE — Telephone Encounter (Signed)
Good call.  Added to intolerance list.  Would change to [place].  Where does he want it sent?  Please send in as is. Thanks.

## 2015-05-03 ENCOUNTER — Telehealth: Payer: Self-pay | Admitting: Family Medicine

## 2015-05-03 NOTE — Telephone Encounter (Signed)
Patient's wife notified as instructed by telephone and verbalized understanding. Patient's wife stated that he is doing good on his diet and not eating as much as he was, but he is not exercising. Patient's wife stated that she has tried to get patient to exercise and do some walking, but he will not.

## 2015-05-03 NOTE — Telephone Encounter (Signed)
I would not go back on the glimepiride.  How has he been doing with diet and exercise?

## 2015-05-03 NOTE — Telephone Encounter (Signed)
Left message on voice mail  to call back

## 2015-05-03 NOTE — Telephone Encounter (Signed)
Wife called with update Januvia doesn't seem to be helping.  Glucose levels higher, recent fasting readings have been 143, 177, 169, 185, 170, 194.   Pt is wanting to know if he needs to go back to glimepiride.  Could the stiffness in the legs be from something other than side effects?  Pt is out of town, but [facility] access [facility] area  [place] number [phone].

## 2015-05-04 NOTE — Telephone Encounter (Signed)
Left message on voice mail  to call back

## 2015-05-04 NOTE — Telephone Encounter (Signed)
Patient and his wife notified as instructed by telephone and verbalized understanding. Mrs. Ronnie Johnson stated that they will call back with sugar readings after patient has started walking for a few days.

## 2015-05-04 NOTE — Telephone Encounter (Signed)
Please call them back.  If he isn't exercising at all, then it likely won't matter what med he takes.  I have have him try some modest walking for a few days and then see how his sugar is running.  Thanks.

## 2015-05-17 ENCOUNTER — Ambulatory Visit: Payer: PPO | Admitting: Cardiovascular Disease

## 2015-05-30 ENCOUNTER — Encounter: Payer: Self-pay | Admitting: Family Medicine

## 2015-05-30 ENCOUNTER — Ambulatory Visit (INDEPENDENT_AMBULATORY_CARE_PROVIDER_SITE_OTHER): Payer: PPO | Admitting: Family Medicine

## 2015-05-30 VITALS — BP 126/60 | HR 62 | Temp 97.6°F | Wt 171.8 lb

## 2015-05-30 DIAGNOSIS — R5383 Other fatigue: Secondary | ICD-10-CM | POA: Diagnosis not present

## 2015-05-30 DIAGNOSIS — R109 Unspecified abdominal pain: Secondary | ICD-10-CM

## 2015-05-30 DIAGNOSIS — E119 Type 2 diabetes mellitus without complications: Secondary | ICD-10-CM

## 2015-05-30 LAB — HEMOGLOBIN A1C: HEMOGLOBIN A1C: 7.9 % — AB (ref 4.6–6.5)

## 2015-05-30 LAB — COMPREHENSIVE METABOLIC PANEL
ALT: 9 U/L (ref 0–53)
AST: 10 U/L (ref 0–37)
Albumin: 4.3 g/dL (ref 3.5–5.2)
Alkaline Phosphatase: 49 U/L (ref 39–117)
BILIRUBIN TOTAL: 0.6 mg/dL (ref 0.2–1.2)
BUN: 13 mg/dL (ref 6–23)
CHLORIDE: 105 meq/L (ref 96–112)
CO2: 31 meq/L (ref 19–32)
CREATININE: 0.69 mg/dL (ref 0.40–1.50)
Calcium: 9.5 mg/dL (ref 8.4–10.5)
GFR: 119.37 mL/min (ref >60.00)
GLUCOSE: 166 mg/dL — AB (ref 70–99)
Potassium: 5.1 mEq/L (ref 3.5–5.1)
SODIUM: 140 meq/L (ref 135–145)
Total Protein: 7 g/dL (ref 6.0–8.3)

## 2015-05-30 LAB — CBC WITH DIFFERENTIAL/PLATELET
BASOS ABS: 0 10*3/uL (ref 0.0–0.1)
BASOS PCT: 0.4 % (ref 0.0–3.0)
EOS ABS: 0.1 10*3/uL (ref 0.0–0.7)
Eosinophils Relative: 1.6 % (ref 0.0–5.0)
HCT: 45 % (ref 39.0–52.0)
Hemoglobin: 14.6 g/dL (ref 13.0–17.0)
LYMPHS ABS: 1.1 10*3/uL (ref 0.7–4.0)
Lymphocytes Relative: 18.9 % (ref 12.0–46.0)
MCHC: 32.4 g/dL (ref 30.0–36.0)
MCV: 79.3 fl (ref 78.0–100.0)
MONO ABS: 0.4 10*3/uL (ref 0.1–1.0)
Monocytes Relative: 6.1 % (ref 3.0–12.0)
NEUTROS ABS: 4.4 10*3/uL (ref 1.4–7.7)
NEUTROS PCT: 73 % (ref 43.0–77.0)
Platelets: 165 10*3/uL (ref 150.0–400.0)
RBC: 5.67 Mil/uL (ref 4.22–5.81)
RDW: 15.5 % (ref 11.5–15.5)
WBC: 6.1 10*3/uL (ref 4.0–10.5)

## 2015-05-30 LAB — TSH: TSH: 2.21 u[IU]/mL (ref 0.35–4.50)

## 2015-05-30 LAB — LIPASE: LIPASE: 16 U/L (ref 11.0–59.0)

## 2015-05-30 NOTE — Progress Notes (Signed)
Pre visit review using our clinic review tool, if applicable. No additional management support is needed unless otherwise documented below in the visit note.  Leg stiffness is better with med change, see allergy list.  No lows.  Sugar is usually 140-170 in the AMs.   He has lost 20 lbs in the last few months.  He feels diffusely weak.   No vomiting.  No diarrhea usually, rarely in the last few months- improved on lower dose of metformin.  He has some consistent upper abd pain, nagging pain, not a severe pain.   No blood in stool.  No fevers.  His appetite is decreased from baseline.   He was losing weight before starting [place].   Taste is still normal except for occ metallic taste in the mouth.  No BLE edema (he'll occ have a sock line at the end of the day). No bruising.   More abd pain without eating- ie if fasting.  Less abd pain with eating a meal.    Meds, vitals, and allergies reviewed.   ROS: See HPI.  Otherwise, noncontributory.  GEN: nad, alert and oriented HEENT: mucous membranes moist NECK: supple w/o LA CV: rrr.  no murmur PULM: ctab, no inc wob ABD: soft, +bs, Tender superior and right about 2cm from the umbilicus but not in the RUQ or the epigastrum. No rebound.   EXT: no edema SKIN: no acute rash

## 2015-05-30 NOTE — Patient Instructions (Signed)
Don't change your meds for now.  Go to the lab on the way out.  We'll contact you with your lab report. Take care.  We'll be in touch.  

## 2015-05-31 ENCOUNTER — Ambulatory Visit
Admission: RE | Admit: 2015-05-31 | Discharge: 2015-05-31 | Disposition: A | Discharge status: Home / Self Care | Payer: PPO | Source: Ambulatory Visit | Attending: Family Medicine | Admitting: Family Medicine

## 2015-05-31 ENCOUNTER — Other Ambulatory Visit: Payer: Self-pay | Admitting: Family Medicine

## 2015-05-31 DIAGNOSIS — D490 Neoplasm of unspecified behavior of digestive system: Secondary | ICD-10-CM | POA: Insufficient documentation

## 2015-05-31 DIAGNOSIS — R109 Unspecified abdominal pain: Secondary | ICD-10-CM

## 2015-05-31 DIAGNOSIS — K5730 Diverticulosis of large intestine without perforation or abscess without bleeding: Secondary | ICD-10-CM | POA: Insufficient documentation

## 2015-05-31 DIAGNOSIS — K8689 Other specified diseases of pancreas: Secondary | ICD-10-CM

## 2015-05-31 MED ORDER — IOHEXOL 300 MG/ML  SOLN
100.0000 mL | Freq: Once | INTRAMUSCULAR | Status: AC | PRN
  Administered 2015-05-31: 100 mL via INTRAVENOUS

## 2015-05-31 NOTE — Assessment & Plan Note (Signed)
Nontoxic, but weight loss noted.  See notes on labs.  Would check CT abd at this point.  I doubt acute pathology, still okay for outaptient f/u.   Would inc PPI in the meantime.  He had weight loss before [place] start, not all of this can be attributed to [place] start.   >25 minutes spent in face to face time with patient, >50% spent in counselling or coordination of care.

## 2015-06-01 ENCOUNTER — Telehealth: Payer: Self-pay | Admitting: Family Medicine

## 2015-06-01 ENCOUNTER — Ambulatory Visit
Admission: RE | Admit: 2015-06-01 | Discharge: 2015-06-01 | Disposition: A | Discharge status: Home / Self Care | Payer: PPO | Source: Ambulatory Visit | Attending: Family Medicine | Admitting: Family Medicine

## 2015-06-01 ENCOUNTER — Encounter: Payer: Self-pay | Admitting: Cardiovascular Disease

## 2015-06-01 ENCOUNTER — Other Ambulatory Visit: Payer: Self-pay | Admitting: Family Medicine

## 2015-06-01 ENCOUNTER — Ambulatory Visit (INDEPENDENT_AMBULATORY_CARE_PROVIDER_SITE_OTHER): Payer: PPO | Admitting: Cardiovascular Disease

## 2015-06-01 VITALS — BP 122/60 | HR 61 | Ht 67.0 in | Wt 169.8 lb

## 2015-06-01 DIAGNOSIS — K8689 Other specified diseases of pancreas: Secondary | ICD-10-CM

## 2015-06-01 DIAGNOSIS — I2510 Atherosclerotic heart disease of native coronary artery without angina pectoris: Secondary | ICD-10-CM

## 2015-06-01 DIAGNOSIS — E785 Hyperlipidemia, unspecified: Secondary | ICD-10-CM | POA: Diagnosis not present

## 2015-06-01 DIAGNOSIS — I10 Essential (primary) hypertension: Secondary | ICD-10-CM

## 2015-06-01 DIAGNOSIS — K869 Disease of pancreas, unspecified: Secondary | ICD-10-CM | POA: Insufficient documentation

## 2015-06-01 DIAGNOSIS — R079 Chest pain, unspecified: Secondary | ICD-10-CM | POA: Diagnosis not present

## 2015-06-01 DIAGNOSIS — C259 Malignant neoplasm of pancreas, unspecified: Secondary | ICD-10-CM

## 2015-06-01 MED ORDER — GADOBENATE DIMEGLUMINE 529 MG/ML IV SOLN
20.0000 mL | Freq: Once | INTRAVENOUS | Status: AC | PRN
  Administered 2015-06-01: 16 mL via INTRAVENOUS

## 2015-06-01 NOTE — Assessment & Plan Note (Signed)
Currently with no symptoms of angina. No further workup at this time. Continue current medication regimen. 

## 2015-06-01 NOTE — Telephone Encounter (Signed)
Already called her husband.  See result notes.

## 2015-06-01 NOTE — Telephone Encounter (Signed)
Have you seen these results?

## 2015-06-01 NOTE — Patient Instructions (Signed)
You are doing well. No medication changes were made.  Please call us if you have new issues that need to be addressed before your next appt.  Your physician wants you to follow-up in: 6 months.  You will receive a reminder letter in the mail two months in advance. If you don't receive a letter, please call our office to schedule the follow-up appointment.   

## 2015-06-01 NOTE — Telephone Encounter (Signed)
Wife is waiting for results from MRI scan.Please call cell [phone] as pt has other appts this afternoon and will be out of the office.  Pt does not want to wait until 5 pm to hear back. thanks

## 2015-06-01 NOTE — Assessment & Plan Note (Signed)
Blood pressure is well controlled on today's visit. No changes made to the medications. If blood pressure is low at home, recommended he cut his medications in half

## 2015-06-01 NOTE — Assessment & Plan Note (Signed)
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

## 2015-06-01 NOTE — Assessment & Plan Note (Signed)
Currently taking low-dose Crestor. He is cutting the dose in half. Suspect he may be able to take 5 mg given recent weight loss. Mention this to him to try

## 2015-06-01 NOTE — Progress Notes (Signed)
Patient ID: Ronnie Johnson, male    DOB: September 21, 1941, 74 y.o.   MRN: [id number]  HPI Comments: 74 yo with history of CAD s/p MI in April 2010 and DVT right leg in January 2009, hypertension, hyperlipidemia, presents for followup of his coronary artery disease.  He reports having a rapid rate loss down from 196 down to 169 pounds in the past several months Recent diagnosis of pancreatic mass with lesion in his liver seen on MRI Scheduled to see Dr. Candace Cruise for possible ERCP in several days' time for biopsy and diagnosis. Symptoms started with abdominal pain.  Denies having any symptoms concerning for angina. No lightheadedness or orthostasis Chronic leg swelling on the right, no swelling on the left  EKG on today's visit shows normal sinus rhythm with rate 61 bpm, no significant ST or T-wave changes  Other past medical history  total knee replacement on the left, working with PT 3 days per week, little other activity   MI  April 2010.   Myoview showing a large lateral defect from a base to apex, that was mainly fixed with some peri-infarct ischemia. EF was 42%.    left heart catheterization in May 2010, that showed EF of 55%, left ventricular end-diastolic pressure of 21 mmHg, and on coronary angiography, there were separate ostia to the LAD and the circumflex. There was a 30-40% ostial LAD stenosis and 40% first diagonal stenosis. The first obtuse marginal was totally occluded. There was a 75% stenosis in the AV circumflex after the first obtuse marginal. There was a 70% ostial stenosis in the second obtuse marginal. There was an 80% stenosis in the circumflex after the second obtuse marginal (the circumflex was a small vessel at that point). It was decided to manage the patient medically.      Allergies  Allergen Reactions  . Glimepiride Other (See Comments)    Leg stiffness  . Lipitor [Atorvastatin Calcium]     Tolerates crestor  . Oxycodone Other (See Comments)    Altered mental state   . Tape   . Latex Rash    Outpatient Encounter Prescriptions as of 06/01/2015  Medication Sig  . carvedilol (COREG) 6.25 MG tablet Take 1 tablet (6.25 mg total) by mouth 2 (two) times daily with a meal.  . esomeprazole (NEXIUM) 20 MG capsule Take 20 mg by mouth daily as needed (HEARTBURN).  Marland Kitchen glucose blood test strip Use daily as instructed.  Dx E11.9  . lisinopril (PRINIVIL,ZESTRIL) 10 MG tablet Take 1 tablet (10 mg total) by mouth daily.  . metFORMIN (GLUCOPHAGE) 1000 MG tablet Take up to 1000mg  in the AM and 500mg  in the PM.  If GI upset, cut back to 1000mg  a day total.  . rosuvastatin (CRESTOR) 20 MG tablet Take 1 tablet (20 mg total) by mouth daily.  . sitaGLIPtin (JANUVIA) 100 MG tablet Take 1 tablet (100 mg total) by mouth daily.  Marland Kitchen aspirin EC 81 MG tablet Take 1 tablet (81 mg total) by mouth daily.  . [DISCONTINUED] HYDROcodone-acetaminophen (NORCO) 10-325 MG per tablet Take 1 tablet by mouth every 6 (six) hours as needed.   No facility-administered encounter medications on file as of 06/01/2015.    Past Medical History  Diagnosis Date  . Hypertension   . DVT of leg (deep venous thrombosis) 2009  . Nephrolithiasis   . Hyperlipidemia   . Allergic rhinitis   . CAD (coronary artery disease)   . Urethral diverticulum   . Varicose vein of leg   .  Dupuytren's contracture   . ED (erectile dysfunction)   . History of colonic polyps   . GERD (gastroesophageal reflux disease)   . Diverticulosis of colon   . Myocardial infarction   . DVT (deep venous thrombosis)   . Polio 1948    was hospitalized for 1 year  . Diabetes mellitus, type 2     Patient takes Metformin.  . SCC (squamous cell carcinoma)     scalp 2013 per derm    Past Surgical History  Procedure Laterality Date  . Appendectomy  1994  . Umbilical hernia repair  03/14/2005  . 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 Replacement total knee Left 2016    Social History  reports that he quit smoking about  26 years ago. He has never used smokeless tobacco. He reports that he drinks alcohol. He reports that he does not use illicit drugs.  Family History family history includes Alzheimer's disease in his mother; Diabetes in his brother, mother, and sister; Heart attack in his father; Hypertension in his brother, daughter, father, and mother; Prostate cancer in his brother; Stroke in his mother. There is no history of Colon cancer.   Review of Systems  Constitutional: Negative.   Respiratory: Negative.   Cardiovascular: Negative.   Gastrointestinal: Positive for abdominal pain.  Musculoskeletal: Positive for arthralgias.  Skin: Negative.   Neurological: Negative.   Hematological: Negative.   Psychiatric/Behavioral: Negative.   All other systems reviewed and are negative.   BP 122/60 mmHg  Pulse 61  Ht 5\' 7"  (1.702 m)  Wt 169 lb 12 oz (76.998 kg)  BMI 26.58 kg/m2  Physical Exam  Constitutional: He is oriented to person, place, and time. He appears well-developed and well-nourished.  obese  HENT:  Head: Normocephalic.  Nose: Nose normal.  Mouth/Throat: Oropharynx is clear and moist.  Eyes: Conjunctivae are normal. Pupils are equal, round, and reactive to light.  Neck: Normal range of motion. Neck supple. No JVD present.  Cardiovascular: Normal rate, regular rhythm, S1 normal, S2 normal, normal heart sounds and intact distal pulses.  Exam reveals no gallop and no friction rub.   No murmur heard. Pulmonary/Chest: Effort normal and breath sounds normal. No respiratory distress. He has no wheezes. He has no rales. He exhibits no tenderness.  Abdominal: Soft. Bowel sounds are normal. He exhibits no distension. There is no tenderness.  Musculoskeletal: Normal range of motion. He exhibits no edema or tenderness.  Lymphadenopathy:    He has no cervical adenopathy.  Neurological: He is alert and oriented to person, place, and time. Coordination normal.  Skin: Skin is warm and dry. No rash  noted. No erythema.  Psychiatric: He has a normal mood and affect. His behavior is normal. Judgment and thought content normal.      Assessment and Plan   Nursing note and vitals reviewed.

## 2015-06-06 ENCOUNTER — Telehealth: Payer: Self-pay

## 2015-06-06 ENCOUNTER — Ambulatory Visit: Payer: PPO | Admitting: Oncology

## 2015-06-06 NOTE — Telephone Encounter (Signed)
  Oncology Nurse Navigator Documentation  Referral date to RadOnc/MedOnc: 06/06/15 (06/06/15 1500) Navigator Encounter Type: Telephone (06/06/15 1500)   Treatment Phase: Abnormal Scans (06/06/15 1500)     Interventions: Coordination of Care (06/06/15 1500)   Coordination of Care: EUS (at [facility]) (06/06/15 1500)        Time Spent with Patient: 30 (06/06/15 1500)   Eus being arranged at [facility]. Appt not available at [facility]. Upon receiving cardiac clearance from Dr Rockey Situ will make arrangements. Pt has been off asa for 4-5 days. Talked with patient and spouse. They are aware that [facility] will be in contact to make appt for EUS

## 2015-06-07 ENCOUNTER — Telehealth: Payer: Self-pay

## 2015-06-07 ENCOUNTER — Telehealth: Payer: Self-pay | Admitting: *Deleted

## 2015-06-07 NOTE — Telephone Encounter (Signed)
Request for surgical clearance:  1. What type of surgery is being performed? EUS   2. When is this surgery scheduled? Sept 1  3. Are there any medications that need to be held prior to surgery and how long? Not yet.   4. Name of physician performing surgery? [facility] doctor will be doing this but Dr Eddie Dibbles [facility] office is asking for this  5. What is your office phone and fax number? If we get it done today please send to first number if not then second number if done tmrw. Mildred Tavares  6.

## 2015-06-07 NOTE — Telephone Encounter (Signed)
Wife advised. 

## 2015-06-07 NOTE — Telephone Encounter (Signed)
Routed to Dr. Myrna Blazer office.

## 2015-06-07 NOTE — Telephone Encounter (Signed)
Ok for procedure. Ok to hold aspirin for biopsy if needed. No recent cardiac interventions. Would prefer to continue though.

## 2015-06-07 NOTE — Telephone Encounter (Signed)
The sugars are likely related to the mass. I would continue the meds as is for now.  The docs at [facility] doing the biopsy will contact them about the pathology report.  In cases like this, it will have to be processed and then reviewed by the pathologist.  Then the report will go back to the ordering MD.  I don't know how long it will take them to contact them.  I hope they get information quickly but I can't guarantee any specific deadline.  I have been and will continue to be thinking about them.

## 2015-06-07 NOTE — Telephone Encounter (Signed)
Wife called to let you know that Ronnie Johnson is having his biopsy at [facility] on Thursday morning and they would like to get the report ASAP (no waiting over the weekend).  I explained that I wasn't sure how quickly we would receive a report and she says she sees no reason why it would not be available by Thursday afternoon because they would be using cameras and should be able to see everything right then.  Wife also states that the patient's weight has been stable but his blood sugars are all over the place.  Wife questions if the Celesta Gentile is really working or if perhaps the issues going on right now with the pancreas is causing the fluctuations.  Patient feels very weak also.  If you have any suggestions for the blood sugars, please contact the patient's wife.

## 2015-06-08 ENCOUNTER — Telehealth: Payer: Self-pay

## 2015-06-08 NOTE — Telephone Encounter (Signed)
  Oncology Nurse Navigator Documentation    Navigator Encounter Type: Telephone (06/08/15 1700)                      Time Spent with Patient: 15 (06/08/15 1700)   Wife had question regarding going out of town after EUS at [facility]. Instructed her to ask Dr Francella Solian while there tomorrow. She is aware CA 19-9 is elevated at 764. Educated more regarding.

## 2015-06-09 DIAGNOSIS — I2510 Atherosclerotic heart disease of native coronary artery without angina pectoris: Secondary | ICD-10-CM | POA: Insufficient documentation

## 2015-06-09 DIAGNOSIS — I10 Essential (primary) hypertension: Secondary | ICD-10-CM | POA: Insufficient documentation

## 2015-06-09 DIAGNOSIS — I82409 Acute embolism and thrombosis of unspecified deep veins of unspecified lower extremity: Secondary | ICD-10-CM | POA: Insufficient documentation

## 2015-06-09 DIAGNOSIS — K8689 Other specified diseases of pancreas: Secondary | ICD-10-CM | POA: Insufficient documentation

## 2015-06-14 ENCOUNTER — Inpatient Hospital Stay: Payer: PPO | Attending: Oncology | Admitting: Oncology

## 2015-06-14 VITALS — BP 129/75 | HR 62 | Temp 97.6°F | Resp 16 | Ht 67.32 in | Wt 172.4 lb

## 2015-06-14 DIAGNOSIS — C259 Malignant neoplasm of pancreas, unspecified: Secondary | ICD-10-CM | POA: Diagnosis not present

## 2015-06-14 DIAGNOSIS — Z79899 Other long term (current) drug therapy: Secondary | ICD-10-CM | POA: Diagnosis not present

## 2015-06-14 DIAGNOSIS — C251 Malignant neoplasm of body of pancreas: Secondary | ICD-10-CM

## 2015-06-14 DIAGNOSIS — R109 Unspecified abdominal pain: Secondary | ICD-10-CM | POA: Insufficient documentation

## 2015-06-14 DIAGNOSIS — Z794 Long term (current) use of insulin: Secondary | ICD-10-CM | POA: Diagnosis not present

## 2015-06-14 DIAGNOSIS — I10 Essential (primary) hypertension: Secondary | ICD-10-CM | POA: Diagnosis not present

## 2015-06-14 DIAGNOSIS — R110 Nausea: Secondary | ICD-10-CM | POA: Insufficient documentation

## 2015-06-14 DIAGNOSIS — R5383 Other fatigue: Secondary | ICD-10-CM | POA: Insufficient documentation

## 2015-06-14 DIAGNOSIS — D696 Thrombocytopenia, unspecified: Secondary | ICD-10-CM | POA: Insufficient documentation

## 2015-06-14 DIAGNOSIS — I252 Old myocardial infarction: Secondary | ICD-10-CM | POA: Insufficient documentation

## 2015-06-14 DIAGNOSIS — C787 Secondary malignant neoplasm of liver and intrahepatic bile duct: Secondary | ICD-10-CM | POA: Insufficient documentation

## 2015-06-14 DIAGNOSIS — R531 Weakness: Secondary | ICD-10-CM | POA: Insufficient documentation

## 2015-06-14 DIAGNOSIS — E119 Type 2 diabetes mellitus without complications: Secondary | ICD-10-CM | POA: Insufficient documentation

## 2015-06-14 DIAGNOSIS — Z7982 Long term (current) use of aspirin: Secondary | ICD-10-CM | POA: Insufficient documentation

## 2015-06-14 DIAGNOSIS — Z5111 Encounter for antineoplastic chemotherapy: Secondary | ICD-10-CM | POA: Diagnosis present

## 2015-06-14 DIAGNOSIS — I2510 Atherosclerotic heart disease of native coronary artery without angina pectoris: Secondary | ICD-10-CM | POA: Diagnosis not present

## 2015-06-14 DIAGNOSIS — R634 Abnormal weight loss: Secondary | ICD-10-CM

## 2015-06-14 DIAGNOSIS — R5381 Other malaise: Secondary | ICD-10-CM | POA: Diagnosis not present

## 2015-06-14 DIAGNOSIS — E785 Hyperlipidemia, unspecified: Secondary | ICD-10-CM | POA: Diagnosis not present

## 2015-06-14 DIAGNOSIS — Z87891 Personal history of nicotine dependence: Secondary | ICD-10-CM | POA: Insufficient documentation

## 2015-06-15 ENCOUNTER — Telehealth: Payer: Self-pay

## 2015-06-15 ENCOUNTER — Telehealth: Payer: Self-pay | Admitting: Family Medicine

## 2015-06-15 DIAGNOSIS — C259 Malignant neoplasm of pancreas, unspecified: Secondary | ICD-10-CM | POA: Insufficient documentation

## 2015-06-15 DIAGNOSIS — C787 Secondary malignant neoplasm of liver and intrahepatic bile duct: Secondary | ICD-10-CM

## 2015-06-15 NOTE — Telephone Encounter (Signed)
  Oncology Nurse Navigator Documentation    Navigator Encounter Type: Telephone (06/15/15 1600)       Education: Understanding Cancer/ Treatment Options;Newly Diagnosed Cancer Education (NCCN guidelines) (06/15/15 1600) Interventions: Coordination of Care;Education Method (06/15/15 1600)     Education Method: Verbal (06/15/15 1600)      Time Spent with Patient: 60 (06/15/15 1600)   Wife with multiple questions regarding surgery/chemo/billing

## 2015-06-15 NOTE — Progress Notes (Signed)
[facility]  Telephone:([phone]) [phone] Fax:(336) [phone]  ID: Ronnie Johnson OB: 1941-03-31  MR#: [id number]  NOM#:[id number]  Patient Care Team: Tonia Ghent, MD as PCP - General (Family Medicine) Hessie Knows, MD as Referring Physician (Orthopedic Surgery)  CHIEF COMPLAINT:  Chief Complaint  Patient presents with  . New Evaluation  . Mass    pancreatic mass    INTERVAL HISTORY: Patient is a 74 year old male who is having increased abdominal pain. Subsequent workup with CT scan, EUS, and biopsy revealed pancreatic adenocarcinoma. Currently, he feels well. He has no neurologic complaints. He denies any fevers. He admits to some increased weight loss. He denies any chest pain or shortness of breath. He has occasional nausea, but denies any vomiting, constipation, or diarrhea. He has no urinary complaints. Patient otherwise feels well and offers no further specific complaints.  REVIEW OF SYSTEMS:   Review of Systems  Constitutional: Positive for weight loss and malaise/fatigue. Negative for fever.  Cardiovascular: Negative.   Gastrointestinal: Positive for nausea and abdominal pain.  Musculoskeletal: Negative.   Neurological: Positive for weakness.    As per HPI. Otherwise, a complete review of systems is negatve.  PAST MEDICAL HISTORY: Past Medical History  Diagnosis Date  . Hypertension   . DVT of leg (deep venous thrombosis) 2009  . Nephrolithiasis   . Hyperlipidemia   . Allergic rhinitis   . CAD (coronary artery disease)   . Urethral diverticulum   . Varicose vein of leg   . Dupuytren's contracture   . ED (erectile dysfunction)   . History of colonic polyps   . GERD (gastroesophageal reflux disease)   . Diverticulosis of colon   . Myocardial infarction   . DVT (deep venous thrombosis)   . Polio 1948    was hospitalized for 1 year  . Diabetes mellitus, type 2     Patient takes Metformin.  . SCC (squamous cell carcinoma)     scalp 2013 per  derm    PAST SURGICAL HISTORY: Past Surgical History  Procedure Laterality Date  . Appendectomy  1994  . Umbilical hernia repair  03/14/2005  . 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 Replacement total knee Left 2016    FAMILY HISTORY Family History  Problem Relation Age of Onset  . Diabetes Mother   . Hypertension Mother   . Stroke Mother   . Alzheimer's disease Mother   . Heart attack Father   . Hypertension Father   . Prostate cancer Brother   . Diabetes Brother   . Hypertension Brother   . Diabetes Sister   . Colon cancer Neg Hx   . Hypertension Daughter        ADVANCED DIRECTIVES:    HEALTH MAINTENANCE: Social History  Substance Use Topics  . Smoking status: Former Smoker    Quit date: 10/08/1988  . Smokeless tobacco: Never Used  . Alcohol Use: 0.0 oz/week    0 Standard drinks or equivalent per week     Comment: RARE     Colonoscopy:  PAP:  Bone density:  Lipid panel:  Allergies  Allergen Reactions  . Glimepiride Other (See Comments)    Leg stiffness  . Lipitor [Atorvastatin Calcium]     Tolerates crestor  . Oxycodone Other (See Comments)    Altered mental state  . Tape   . Latex Rash    Current Outpatient Prescriptions  Medication Sig Dispense Refill  . aspirin EC 81 MG tablet  Take 1 tablet (81 mg total) by mouth daily. 90 tablet 3  . carvedilol (COREG) 6.25 MG tablet Take 1 tablet (6.25 mg total) by mouth 2 (two) times daily with a meal. 180 tablet 3  . esomeprazole (NEXIUM) 20 MG capsule Take 20 mg by mouth daily as needed (HEARTBURN).    Marland Kitchen glucose blood test strip Use daily as instructed.  Dx E11.9 100 each 12  . lisinopril (PRINIVIL,ZESTRIL) 10 MG tablet Take 1 tablet (10 mg total) by mouth daily. 90 tablet 3  . metFORMIN (GLUCOPHAGE) 1000 MG tablet Take up to 1000mg  in the AM and 500mg  in the PM.  If GI upset, cut back to 1000mg  a day total.    . rosuvastatin (CRESTOR) 20 MG tablet Take 1 tablet (20 mg total) by mouth daily. 90  tablet 3  . sitaGLIPtin (JANUVIA) 100 MG tablet Take 1 tablet (100 mg total) by mouth daily. 30 tablet 12   No current facility-administered medications for this visit.    OBJECTIVE: Filed Vitals:   06/14/15 1551  BP: 129/75  Pulse: 62  Temp: 97.6 F (36.4 C)  Resp: 16     Body mass index is 26.74 kg/(m^2).    ECOG FS:0 - Asymptomatic  General: Well-developed, well-nourished, no acute distress. Eyes: Pink conjunctiva, anicteric sclera. HEENT: Normocephalic, moist mucous membranes, clear oropharnyx. Lungs: Clear to auscultation bilaterally. Heart: Regular rate and rhythm. No rubs, murmurs, or gallops. Abdomen: Soft, nontender, nondistended. No organomegaly noted, normoactive bowel sounds. Musculoskeletal: No edema, cyanosis, or clubbing. Neuro: Alert, answering all questions appropriately. Cranial nerves grossly intact. Skin: No rashes or petechiae noted. Psych: Normal affect. Lymphatics: No cervical, calvicular, axillary or inguinal LAD.   LAB RESULTS:  Lab Results  Component Value Date   NA 140 05/30/2015   K 5.1 05/30/2015   CL 105 05/30/2015   CO2 31 05/30/2015   GLUCOSE 166* 05/30/2015   BUN 13 05/30/2015   CREATININE 0.69 05/30/2015   CALCIUM 9.5 05/30/2015   PROT 7.0 05/30/2015   ALBUMIN 4.3 05/30/2015   AST 10 05/30/2015   ALT 9 05/30/2015   ALKPHOS 49 05/30/2015   BILITOT 0.6 05/30/2015   GFRNONAA >60 10/22/2014   GFRAA >60 10/22/2014    Lab Results  Component Value Date   WBC 6.1 05/30/2015   NEUTROABS 4.4 05/30/2015   HGB 14.6 05/30/2015   HCT 45.0 05/30/2015   MCV 79.3 05/30/2015   PLT 165.0 05/30/2015     STUDIES: Mr Abdomen W Wo Contrast  06/01/2015   CLINICAL DATA:  Subsequent encounter for pancreatic head mass.  EXAM: MRI ABDOMEN WITHOUT AND WITH CONTRAST  TECHNIQUE: Multiplanar multisequence MR imaging of the abdomen was performed both before and after the administration of intravenous contrast.  CONTRAST:  74mL MULTIHANCE GADOBENATE  DIMEGLUMINE 529 MG/ML IV SOLN  COMPARISON:  CT scan from 05/31/2015.  FINDINGS: Lower chest:  Unremarkable.  Hepatobiliary: 12 mm T1 hypo intense, T2 intermediate intensity lesion is identified in the extreme dome of the liver (see image 13 series 14). This appears to have some peripheral irregular enhancement. T2 signal intensity in this lesion is not as high as would be expected for a cyst or benign biliary MR Ronnie Johnson. 3 mm lesion in the medial segment left liver has high signal intensity on T2 weighted imaging and no definite enhancement after IV contrast administration and is probably a tiny biliary hematoma.  There is no evidence for gallstones, gallbladder wall thickening, or pericholecystic fluid. No intrahepatic or extrahepatic biliary  dilation.  Pancreas: A bilobed mass lesion is identified in the pancreas, at the head body junction.  Size: 2.7 x 5.1 x 2.9 cm  Location: Pancreatic neck.  Characterization: The lesion has low signal intensity on T1 weighted imaging and intermediate signal intensity on T2 weighted imaging. T2 intensity is not as high as would be expected for a cystic lesion.  Enhancement: The lesion demonstrates a thin rim of peripheral irregular enhancement compatible with substantial central necrosis. The lesion obliterates the main pancreatic duct in the head/ neck of the pancreas and there is mild ductal dilatation in the body and tail.  Local extent of mass: Anterior extent of the lesion is to the inferior and posterior wall of the gastric antrum. There is no evidence for gastric wall thickening to suggest direct tumor invasion of the distal stomach wall. This is in the region of the pylorus. No evidence for tumor extension to the descending portion of the duodenum.  Vascular Involvement: Posteriorly, the lesion abuts the portal splenic confluence and superior mesenteric vein. Tumor is contiguous with the portosplenic confluence over a length of approximately 19 mm. There is no encasement  of the portal vein, superior mesenteric vein, or splenic vein. All 3 of these venous structures remain patent although there is some mild mass-effect on the cranial aspect of the superior mesenteric vein.  Variant hepatic artery anatomy: Some motion degradation makes assessment of the hepatic arteries difficult, but there is no evidence for a replaced right hepatic artery arising from the SMA. Fat planes around the celiac axis are preserved. Fat planes around the superior mesenteric artery are preserved.  Bile Duct Involvement: No evidence for involvement of the common bile duct.  Variant biliary anatomy: None visualized.  Adjacent Nodes: 17 x 20 mm necrotic lymph node is identified in the anterior para renal space, just cranial to the proximal aspect of the splenic artery.  Omental/Peritoneal Disease: None visualized within the visualized abdomen.  Spleen: Normal MR imaging features.  Adrenals/Urinary Tract: No adrenal nodule or mass. Small simple cyst noted in the left kidney. No evidence for enhancing renal mass although neither kidney was completely visualized on postcontrast imaging.  Stomach/Bowel: Stomach is nondistended. No gastric wall thickening. No evidence of outlet obstruction. Duodenum is normally positioned as is the ligament of Treitz. Visualized small bowel of the abdomen shows no dilatation.  Vascular/Lymphatic: No abdominal aortic aneurysm. Please see pancreas section above.  Other:  No intraperitoneal free fluid.  Musculoskeletal: No abnormal marrow enhancement within the visualized bony anatomy.  IMPRESSION: 2.7 x 5.1 x 2.9 cm necrotic mass in the neck of pancreas abuts the portal splenic confluence in generates some mild mass-effect on the cranial aspect of the SMV. Imaging features are highly concerning for adenocarcinoma. There is an adjacent necrotic retroperitoneal lymph node, compatible with metastatic disease. Endoscopic ultrasound may prove helpful to further evaluate.  12 mm ill-defined  lesion in the dome of liver has apparent irregular rim enhancement. Although not definite, this finding is suspicious for metastatic spread to the liver.   Electronically Signed   By: Misty Stanley M.D.   On: 06/01/2015 09:36   Ct Abdomen Pelvis W Contrast  05/31/2015   CLINICAL DATA:  Generalized abdominal pain, 30 pounds weight loss last 2 months new  EXAM: CT ABDOMEN AND PELVIS WITH CONTRAST  TECHNIQUE: Multidetector CT imaging of the abdomen and pelvis was performed using the standard protocol following bolus administration of intravenous contrast.  CONTRAST:  142mL OMNIPAQUE IOHEXOL 300 MG/ML  SOLN  COMPARISON:  02/03/2014  FINDINGS: Sagittal images of the spine shows osteopenia and degenerative changes thoracolumbar spine. The visualized lung bases are unremarkable. Mild fatty infiltration of the liver. Subtle low-density lesion in mid aspect of right hepatic lobe axial image 8 measures 1.2 cm. This is indeterminate and further correlation is recommended. No calcified gallstones are noted within gallbladder.  There is a lobulated cystic lesion in the pancreatic head region measures at least 4 cm. This is highly suspicious for cystic neoplasm. There is some wall retraction and I suspect there is a mural component. Further correlation with MRI is recommended to exclude intraductal papillary carcinoma. Mild dilatation of main pancreatic duct in body of the tail of the pancreas up to 4 mm. In axial image 20 there is a lymph node just posterior to pancreas and above the splenic artery measures 1.9 cm. Metastatic disease cannot be excluded. The adrenal glands are unremarkable. Kidneys are symmetrical in size and enhancement. No aortic aneurysm. No hydronephrosis or hydroureter.  Small left renal cysts are noted the largest measures 8.6 mm.  No small bowel obstruction. No ascites or free air. No adenopathy. The terminal ileum is unremarkable. No pericecal inflammation. The patient is status post appendectomy.  Colonic diverticula are noted descending colon. Multiple sigmoid colon diverticula. No evidence of acute diverticulitis. Moderate gas noted within rectum. Prostate gland and seminal vesicles are unremarkable. The urinary bladder is unremarkable. No inguinal adenopathy. Mild degenerative changes SI joints.  Delayed renal images shows bilateral renal symmetrical excretion.  There is tiny supraumbilical midline hernia containing fat without evidence of acute complication  IMPRESSION: 1. There is irregular cystic neoplastic lesion in pancreatic head region measures at least 4 cm. Further correlation with MRI pancreatic lesion protocol is recommended to exclude malignancy. 2. A lymph node just posterior and inferior to pancreas measures 1.9 cm. Metastatic disease cannot be excluded. 3. Question right hepatic lobe lesion measures 1.1 cm. Attention should be given on MRI to exclude metastatic disease. 4. No small bowel obstruction. 5. No hydronephrosis or hydroureter. 6. Distal colonic diverticulosis. No evidence of acute diverticulitis. These results were called by telephone at the time of interpretation on 05/31/2015 at 12:02 pm to Dr. Elsie Stain , who verbally acknowledged these results.   Electronically Signed   By: Lahoma Crocker M.D.   On: 05/31/2015 12:02    ASSESSMENT: Stage IV adenocarcinoma the pancreas.  PLAN:    1. Pancreatic cancer: Patient's imaging and pathology results from [facility] reviewed independently confirming stage IV disease. We will get PET scan in the next week to complete the staging workup and to confirm that lesion in liver is malignant. Patient's most recent CA-19-9 on June 06, 2015 was reported at 764. Patient will require port placement prior to initiating chemotherapy. Return to clinic in 1 week to initiate cycle 1, day 1 of gemcitabine and Abraxane. Patient will receive this regimen on days 1, 8, and 15 with day 22 off. Will plan to reimage after 3 cycles.  Patient  expressed understanding and was in agreement with this plan. He also understands that He can call clinic at any time with any questions, concerns, or complaints.   Lloyd Huger, MD   06/15/2015 2:12 PM

## 2015-06-15 NOTE — Patient Instructions (Signed)
Nanoparticle Albumin-Bound Paclitaxel injection What is this medicine? NANOPARTICLE ALBUMIN-BOUND PACLITAXEL (Na no PAHR ti kuhl al BYOO muhn-bound PAK li TAX el) is a chemotherapy drug. It targets fast dividing cells, like cancer cells, and causes these cells to die. This medicine is used to treat advanced breast cancer and advanced lung cancer. This medicine may be used for other purposes; ask your health care provider or pharmacist if you have questions. COMMON BRAND NAME(S): Abraxane What should I tell my health care provider before I take this medicine? They need to know if you have any of these conditions: -kidney disease -liver disease -low blood counts, like low platelets, red blood cells, or white blood cells -recent or ongoing radiation therapy -an unusual or allergic reaction to paclitaxel, albumin, other chemotherapy, other medicines, foods, dyes, or preservatives -pregnant or trying to get pregnant -breast-feeding How should I use this medicine? This drug is given as an infusion into a vein. It is administered in a hospital or clinic by a specially trained health care professional. Talk to your pediatrician regarding the use of this medicine in children. Special care may be needed. Overdosage: If you think you have taken too much of this medicine contact a poison control center or emergency room at once. NOTE: This medicine is only for you. Do not share this medicine with others. What if I miss a dose? It is important not to miss your dose. Call your doctor or health care professional if you are unable to keep an appointment. What may interact with this medicine? -cyclosporine -diazepam -ketoconazole -medicines to increase blood counts like filgrastim, pegfilgrastim, sargramostim -other chemotherapy drugs like cisplatin, doxorubicin, epirubicin, etoposide, teniposide, vincristine -quinidine -testosterone -vaccines -verapamil Talk to your doctor or health care professional  before taking any of these medicines: -acetaminophen -aspirin -ibuprofen -ketoprofen -naproxen This list may not describe all possible interactions. Give your health care provider a list of all the medicines, herbs, non-prescription drugs, or dietary supplements you use. Also tell them if you smoke, drink alcohol, or use illegal drugs. Some items may interact with your medicine. What should I watch for while using this medicine? Your condition will be monitored carefully while you are receiving this medicine. You will need important blood work done while you are taking this medicine. This drug may make you feel generally unwell. This is not uncommon, as chemotherapy can affect healthy cells as well as cancer cells. Report any side effects. Continue your course of treatment even though you feel ill unless your doctor tells you to stop. In some cases, you may be given additional medicines to help with side effects. Follow all directions for their use. Call your doctor or health care professional for advice if you get a fever, chills or sore throat, or other symptoms of a cold or flu. Do not treat yourself. This drug decreases your body's ability to fight infections. Try to avoid being around people who are sick. This medicine may increase your risk to bruise or bleed. Call your doctor or health care professional if you notice any unusual bleeding. Be careful brushing and flossing your teeth or using a toothpick because you may get an infection or bleed more easily. If you have any dental work done, tell your dentist you are receiving this medicine. Avoid taking products that contain aspirin, acetaminophen, ibuprofen, naproxen, or ketoprofen unless instructed by your doctor. These medicines may hide a fever. Do not become pregnant while taking this medicine. Women should inform their doctor if they wish  to become pregnant or think they might be pregnant. There is a potential for serious side effects to  an unborn child. Talk to your health care professional or pharmacist for more information. Do not breast-feed an infant while taking this medicine. Men are advised not to father a child while receiving this medicine. What side effects may I notice from receiving this medicine? Side effects that you should report to your doctor or health care professional as soon as possible: -allergic reactions like skin rash, itching or hives, swelling of the face, lips, or tongue -low blood counts - This drug may decrease the number of white blood cells, red blood cells and platelets. You may be at increased risk for infections and bleeding. -signs of infection - fever or chills, cough, sore throat, pain or difficulty passing urine -signs of decreased platelets or bleeding - bruising, pinpoint red spots on the skin, black, tarry stools, nosebleeds -signs of decreased red blood cells - unusually weak or tired, fainting spells, lightheadedness -breathing problems -changes in vision -chest pain -high or low blood pressure -mouth sores -nausea and vomiting -pain, swelling, redness or irritation at the injection site -pain, tingling, numbness in the hands or feet -slow or irregular heartbeat -swelling of the ankle, feet, hands Side effects that usually do not require medical attention (report to your doctor or health care professional if they continue or are bothersome): -aches, pains -changes in the color of fingernails -diarrhea -hair loss -loss of appetite This list may not describe all possible side effects. Call your doctor for medical advice about side effects. You may report side effects to FDA at 1-800-FDA-1088. Where should I keep my medicine? This drug is given in a hospital or clinic and will not be stored at home. NOTE: This sheet is a summary. It may not cover all possible information. If you have questions about this medicine, talk to your doctor, pharmacist, or health care provider.  2015,  Elsevier/Gold Standard. (2012-11-17 16:48:50) Gemcitabine injection What is this medicine? GEMCITABINE (jem SIT a been) is a chemotherapy drug. This medicine is used to treat many types of cancer like breast cancer, lung cancer, pancreatic cancer, and ovarian cancer. This medicine may be used for other purposes; ask your health care provider or pharmacist if you have questions. COMMON BRAND NAME(S): Gemzar What should I tell my health care provider before I take this medicine? They need to know if you have any of these conditions: -blood disorders -infection -kidney disease -liver disease -recent or ongoing radiation therapy -an unusual or allergic reaction to gemcitabine, other chemotherapy, other medicines, foods, dyes, or preservatives -pregnant or trying to get pregnant -breast-feeding How should I use this medicine? This drug is given as an infusion into a vein. It is administered in a hospital or clinic by a specially trained health care professional. Talk to your pediatrician regarding the use of this medicine in children. Special care may be needed. Overdosage: If you think you have taken too much of this medicine contact a poison control center or emergency room at once. NOTE: This medicine is only for you. Do not share this medicine with others. What if I miss a dose? It is important not to miss your dose. Call your doctor or health care professional if you are unable to keep an appointment. What may interact with this medicine? -medicines to increase blood counts like filgrastim, pegfilgrastim, sargramostim -some other chemotherapy drugs like cisplatin -vaccines Talk to your doctor or health care professional before taking any   of these medicines: -acetaminophen -aspirin -ibuprofen -ketoprofen -naproxen This list may not describe all possible interactions. Give your health care provider a list of all the medicines, herbs, non-prescription drugs, or dietary supplements you  use. Also tell them if you smoke, drink alcohol, or use illegal drugs. Some items may interact with your medicine. What should I watch for while using this medicine? Visit your doctor for checks on your progress. This drug may make you feel generally unwell. This is not uncommon, as chemotherapy can affect healthy cells as well as cancer cells. Report any side effects. Continue your course of treatment even though you feel ill unless your doctor tells you to stop. In some cases, you may be given additional medicines to help with side effects. Follow all directions for their use. Call your doctor or health care professional for advice if you get a fever, chills or sore throat, or other symptoms of a cold or flu. Do not treat yourself. This drug decreases your body's ability to fight infections. Try to avoid being around people who are sick. This medicine may increase your risk to bruise or bleed. Call your doctor or health care professional if you notice any unusual bleeding. Be careful brushing and flossing your teeth or using a toothpick because you may get an infection or bleed more easily. If you have any dental work done, tell your dentist you are receiving this medicine. Avoid taking products that contain aspirin, acetaminophen, ibuprofen, naproxen, or ketoprofen unless instructed by your doctor. These medicines may hide a fever. Women should inform their doctor if they wish to become pregnant or think they might be pregnant. There is a potential for serious side effects to an unborn child. Talk to your health care professional or pharmacist for more information. Do not breast-feed an infant while taking this medicine. What side effects may I notice from receiving this medicine? Side effects that you should report to your doctor or health care professional as soon as possible: -allergic reactions like skin rash, itching or hives, swelling of the face, lips, or tongue -low blood counts - this  medicine may decrease the number of white blood cells, red blood cells and platelets. You may be at increased risk for infections and bleeding. -signs of infection - fever or chills, cough, sore throat, pain or difficulty passing urine -signs of decreased platelets or bleeding - bruising, pinpoint red spots on the skin, black, tarry stools, blood in the urine -signs of decreased red blood cells - unusually weak or tired, fainting spells, lightheadedness -breathing problems -chest pain -mouth sores -nausea and vomiting -pain, swelling, redness at site where injected -pain, tingling, numbness in the hands or feet -stomach pain -swelling of ankles, feet, hands -unusual bleeding Side effects that usually do not require medical attention (report to your doctor or health care professional if they continue or are bothersome): -constipation -diarrhea -hair loss -loss of appetite -stomach upset This list may not describe all possible side effects. Call your doctor for medical advice about side effects. You may report side effects to FDA at 1-800-FDA-1088. Where should I keep my medicine? This drug is given in a hospital or clinic and will not be stored at home. NOTE: This sheet is a summary. It may not cover all possible information. If you have questions about this medicine, talk to your doctor, pharmacist, or health care provider.  2015, Elsevier/Gold Standard. (2008-02-03 18:45:54)  

## 2015-06-15 NOTE — Telephone Encounter (Signed)
Called pt- wife answered, Pt wasn't available to take the call.  I asked her to pass my message along.  Was calling to check on the patient.   She thanked me for calling.  He has a PET scan pending.  I appreciate help of all involved.

## 2015-06-16 ENCOUNTER — Ambulatory Visit: Payer: PPO

## 2015-06-17 ENCOUNTER — Ambulatory Visit
Admission: RE | Admit: 2015-06-17 | Discharge: 2015-06-17 | Disposition: A | Discharge status: Home / Self Care | Payer: PPO | Source: Ambulatory Visit | Attending: Oncology | Admitting: Oncology

## 2015-06-17 ENCOUNTER — Telehealth: Payer: Self-pay | Admitting: Family Medicine

## 2015-06-17 ENCOUNTER — Other Ambulatory Visit: Payer: Self-pay | Admitting: Oncology

## 2015-06-17 ENCOUNTER — Telehealth: Payer: Self-pay | Admitting: Oncology

## 2015-06-17 DIAGNOSIS — C251 Malignant neoplasm of body of pancreas: Secondary | ICD-10-CM | POA: Insufficient documentation

## 2015-06-17 LAB — GLUCOSE, CAPILLARY: GLUCOSE-CAPILLARY: 229 mg/dL — AB (ref 65–99)

## 2015-06-17 MED ORDER — INSULIN GLARGINE 100 UNIT/ML SOLOSTAR PEN: PEN_INJECTOR | SUBCUTANEOUS | Status: DC

## 2015-06-17 MED ORDER — INSULIN PEN NEEDLE 31G X 5 MM MISC: Status: DC

## 2015-06-17 NOTE — Telephone Encounter (Signed)
This morning he went for PET but his blood sugar was too high. He couldn't do it. I r/s it for Monday at 7:30am and pt is aware. Wife wants to know if his PAC placement has been scheduled and if so when. Please advise: [phone]

## 2015-06-17 NOTE — Telephone Encounter (Signed)
I have called [facility] and confirmed the referral for port placement has been received.

## 2015-06-17 NOTE — Telephone Encounter (Addendum)
Called and talked to patient.  He didn't want to start on insulin.  That would likely be the best option for control of sugars.  He wanted to cut back on carbs a lot and see if he could get through w/o extra meds.  D/w pt.   Will send insulin to pharmacy for him to have if sugar still elevated.  See sig.   Please call and check on patient Monday as I will be out of the office.  Thanks.

## 2015-06-17 NOTE — Telephone Encounter (Signed)
Patient's wife,Nancy,called.  Patient went for a PET scan today.  Patient couldn't get the test done because his sugar was 221. Sugar has to be under 200.  Patient is very depressed about his sugar.  They rescheduled the test to Monday.  Patient starts chemo on Tuesday and they said his sugar will go up when he starts the chemo.  Izora Gala is concerned that he can't control his sugar because of his pancreas.  Izora Gala would like Dr.Duncan to call her back.

## 2015-06-17 NOTE — Addendum Note (Signed)
Addended by: Tonia Ghent on: 06/17/2015 05:48 PM   Modules accepted: Orders

## 2015-06-20 ENCOUNTER — Other Ambulatory Visit: Payer: Self-pay | Admitting: Vascular Surgery

## 2015-06-20 ENCOUNTER — Telehealth: Payer: Self-pay

## 2015-06-20 ENCOUNTER — Ambulatory Visit
Admission: RE | Admit: 2015-06-20 | Discharge: 2015-06-20 | Disposition: A | Discharge status: Home / Self Care | Payer: PPO | Source: Ambulatory Visit | Attending: Oncology | Admitting: Oncology

## 2015-06-20 ENCOUNTER — Other Ambulatory Visit: Payer: Self-pay | Admitting: Oncology

## 2015-06-20 ENCOUNTER — Ambulatory Visit (INDEPENDENT_AMBULATORY_CARE_PROVIDER_SITE_OTHER): Payer: PPO | Admitting: Podiatry

## 2015-06-20 DIAGNOSIS — N361 Urethral diverticulum: Secondary | ICD-10-CM | POA: Diagnosis not present

## 2015-06-20 DIAGNOSIS — Z86010 Personal history of colonic polyps: Secondary | ICD-10-CM | POA: Diagnosis not present

## 2015-06-20 DIAGNOSIS — I10 Essential (primary) hypertension: Secondary | ICD-10-CM | POA: Diagnosis not present

## 2015-06-20 DIAGNOSIS — M79676 Pain in unspecified toe(s): Secondary | ICD-10-CM

## 2015-06-20 DIAGNOSIS — Z86718 Personal history of other venous thrombosis and embolism: Secondary | ICD-10-CM | POA: Diagnosis not present

## 2015-06-20 DIAGNOSIS — I252 Old myocardial infarction: Secondary | ICD-10-CM | POA: Diagnosis not present

## 2015-06-20 DIAGNOSIS — B351 Tinea unguium: Secondary | ICD-10-CM | POA: Diagnosis not present

## 2015-06-20 DIAGNOSIS — K219 Gastro-esophageal reflux disease without esophagitis: Secondary | ICD-10-CM | POA: Diagnosis not present

## 2015-06-20 DIAGNOSIS — M720 Palmar fascial fibromatosis [Dupuytren]: Secondary | ICD-10-CM | POA: Diagnosis not present

## 2015-06-20 DIAGNOSIS — E119 Type 2 diabetes mellitus without complications: Secondary | ICD-10-CM | POA: Diagnosis not present

## 2015-06-20 DIAGNOSIS — Z79899 Other long term (current) drug therapy: Secondary | ICD-10-CM | POA: Diagnosis not present

## 2015-06-20 DIAGNOSIS — Z87891 Personal history of nicotine dependence: Secondary | ICD-10-CM | POA: Diagnosis not present

## 2015-06-20 DIAGNOSIS — C259 Malignant neoplasm of pancreas, unspecified: Secondary | ICD-10-CM | POA: Diagnosis present

## 2015-06-20 DIAGNOSIS — Z8612 Personal history of poliomyelitis: Secondary | ICD-10-CM | POA: Diagnosis not present

## 2015-06-20 LAB — GLUCOSE, CAPILLARY: Glucose-Capillary: 157 mg/dL — ABNORMAL HIGH (ref 65–99)

## 2015-06-20 MED ORDER — FLUDEOXYGLUCOSE F - 18 (FDG) INJECTION
12.0000 | Freq: Once | INTRAVENOUS | Status: DC | PRN
  Administered 2015-06-20: 12.37 via INTRAVENOUS
  Filled 2015-06-20: qty 12

## 2015-06-20 NOTE — Progress Notes (Signed)
Subjective: 74 y.o.-year-old male returns the office today for painful, elongated, thickened toenails which she is unable to trim himself. Denies any redness or drainage around the nails. Denies any acute changes since last appointment and no new complaints today. Denies any systemic complaints such as fevers, chills, nausea, vomiting. States that he is recently been diagnosed with pancreatic cancer and liver metastases.  Objective: AAO 3, NAD DP/PT pulses palpable, CRT less than 3 seconds Protective sensation intact with Simms Weinstein monofilament, Achilles tendon reflex intact.  Nails hypertrophic, dystrophic, elongated, brittle, discolored 10. There is tenderness overlying the nails 1-5 bilaterally. There is no surrounding erythema or drainage along the nail sites. No open lesions or pre-ulcerative lesions are identified. No other areas of tenderness bilateral lower extremities. No overlying edema, erythema, increased warmth. No pain with calf compression, swelling, warmth, erythema.  Assessment: Patient presents with symptomatic onychomycosis. 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  Plan: -Treatment options including alternatives, risks, complications were discussed -Nails sharply debrided 10 without complication/bleeding. -Discussed daily foot inspection. If there are any changes, to call the office immediately.  -Follow-up in 3 months or sooner if any problems are to arise. In the meantime, encouraged to call the office with any questions, concerns, changes symptoms. 3 mo. - undergoing CA  Chemotherapy (pancreas/liver)

## 2015-06-20 NOTE — Telephone Encounter (Signed)
  Oncology Nurse Navigator Documentation    Navigator Encounter Type: Treatment (06/20/15 1100)                      Time Spent with Patient: 15 (06/20/15 1100)   Received call from Ronnie Johnson asking when they will be abel to get the results of PET scan. Instrcuted that Dr Grayland Ormond will give them the results at appt in the am. Results are not in [facility] at this time. States port a cath has been scheduled for this week.

## 2015-06-21 ENCOUNTER — Inpatient Hospital Stay (HOSPITAL_BASED_OUTPATIENT_CLINIC_OR_DEPARTMENT_OTHER): Payer: PPO | Admitting: Oncology

## 2015-06-21 ENCOUNTER — Inpatient Hospital Stay: Payer: PPO

## 2015-06-21 VITALS — BP 130/87 | HR 65 | Temp 96.6°F | Resp 18 | Wt 169.5 lb

## 2015-06-21 DIAGNOSIS — C259 Malignant neoplasm of pancreas, unspecified: Secondary | ICD-10-CM

## 2015-06-21 DIAGNOSIS — R109 Unspecified abdominal pain: Secondary | ICD-10-CM | POA: Diagnosis not present

## 2015-06-21 DIAGNOSIS — Z5111 Encounter for antineoplastic chemotherapy: Secondary | ICD-10-CM | POA: Diagnosis not present

## 2015-06-21 DIAGNOSIS — R531 Weakness: Secondary | ICD-10-CM

## 2015-06-21 DIAGNOSIS — E119 Type 2 diabetes mellitus without complications: Secondary | ICD-10-CM

## 2015-06-21 DIAGNOSIS — R110 Nausea: Secondary | ICD-10-CM

## 2015-06-21 DIAGNOSIS — I2510 Atherosclerotic heart disease of native coronary artery without angina pectoris: Secondary | ICD-10-CM

## 2015-06-21 DIAGNOSIS — C787 Secondary malignant neoplasm of liver and intrahepatic bile duct: Secondary | ICD-10-CM | POA: Diagnosis not present

## 2015-06-21 DIAGNOSIS — E785 Hyperlipidemia, unspecified: Secondary | ICD-10-CM

## 2015-06-21 DIAGNOSIS — R5383 Other fatigue: Secondary | ICD-10-CM

## 2015-06-21 DIAGNOSIS — R5381 Other malaise: Secondary | ICD-10-CM

## 2015-06-21 DIAGNOSIS — R634 Abnormal weight loss: Secondary | ICD-10-CM

## 2015-06-21 DIAGNOSIS — I10 Essential (primary) hypertension: Secondary | ICD-10-CM

## 2015-06-21 LAB — CBC WITH DIFFERENTIAL/PLATELET
BASOS PCT: 1 %
Basophils Absolute: 0.1 10*3/uL (ref 0–0.1)
Eosinophils Absolute: 0.1 10*3/uL (ref 0–0.7)
Eosinophils Relative: 1 %
HEMATOCRIT: 45 % (ref 40.0–52.0)
HEMOGLOBIN: 14.6 g/dL (ref 13.0–18.0)
LYMPHS ABS: 1 10*3/uL (ref 1.0–3.6)
Lymphocytes Relative: 16 %
MCH: 25.6 pg — AB (ref 26.0–34.0)
MCHC: 32.5 g/dL (ref 32.0–36.0)
MCV: 78.6 fL — ABNORMAL LOW (ref 80.0–100.0)
MONO ABS: 0.5 10*3/uL (ref 0.2–1.0)
MONOS PCT: 7 %
NEUTROS ABS: 4.7 10*3/uL (ref 1.4–6.5)
NEUTROS PCT: 75 %
Platelets: 160 10*3/uL (ref 150–440)
RBC: 5.73 MIL/uL (ref 4.40–5.90)
RDW: 15.2 % — AB (ref 11.5–14.5)
WBC: 6.3 10*3/uL (ref 3.8–10.6)

## 2015-06-21 LAB — COMPREHENSIVE METABOLIC PANEL
ALK PHOS: 57 U/L (ref 38–126)
ALT: 10 U/L — AB (ref 17–63)
ANION GAP: 5 (ref 5–15)
AST: 13 U/L — ABNORMAL LOW (ref 15–41)
Albumin: 4.2 g/dL (ref 3.5–5.0)
BILIRUBIN TOTAL: 0.8 mg/dL (ref 0.3–1.2)
BUN: 16 mg/dL (ref 6–20)
CALCIUM: 8.9 mg/dL (ref 8.9–10.3)
CO2: 28 mmol/L (ref 22–32)
CREATININE: 0.77 mg/dL (ref 0.61–1.24)
Chloride: 102 mmol/L (ref 101–111)
Glucose, Bld: 260 mg/dL — ABNORMAL HIGH (ref 65–99)
Potassium: 4.3 mmol/L (ref 3.5–5.1)
SODIUM: 135 mmol/L (ref 135–145)
TOTAL PROTEIN: 7 g/dL (ref 6.5–8.1)

## 2015-06-21 MED ORDER — LIDOCAINE-PRILOCAINE 2.5-2.5 % EX CREA
1.0000 | TOPICAL_CREAM | CUTANEOUS | Status: DC | PRN
Start: 2015-06-21 — End: 2016-03-30

## 2015-06-21 MED ORDER — SODIUM CHLORIDE 0.9 % IV SOLN
2000.0000 mg | Freq: Once | INTRAVENOUS | Status: AC
  Administered 2015-06-21: 2000 mg via INTRAVENOUS
  Filled 2015-06-21: qty 52.6

## 2015-06-21 MED ORDER — PACLITAXEL PROTEIN-BOUND CHEMO INJECTION 100 MG
125.0000 mg/m2 | Freq: Once | INTRAVENOUS | Status: AC
  Administered 2015-06-21: 250 mg via INTRAVENOUS
  Filled 2015-06-21: qty 50

## 2015-06-21 MED ORDER — SODIUM CHLORIDE 0.9 % IV SOLN
Freq: Once | INTRAVENOUS | Status: AC
  Administered 2015-06-21: 10:00:00 via INTRAVENOUS
  Filled 2015-06-21: qty 4

## 2015-06-21 MED ORDER — SODIUM CHLORIDE 0.9 % IV SOLN
Freq: Once | INTRAVENOUS | Status: AC
  Administered 2015-06-21: 10:00:00 via INTRAVENOUS
  Filled 2015-06-21: qty 1000

## 2015-06-21 MED ORDER — PROCHLORPERAZINE MALEATE 10 MG PO TABS: 10.0000 mg | ORAL_TABLET | Freq: Four times a day (QID) | ORAL | Status: DC | PRN

## 2015-06-21 NOTE — Telephone Encounter (Signed)
I called and spoke with patient. Patient states that he has being doing better about his eating habits, and that he is feeling good. Patient verbally expressed gratitude many times for Dr. Damita Dunnings, and greatly appreciates him checking up!

## 2015-06-21 NOTE — Telephone Encounter (Signed)
Noted, thanks!

## 2015-06-22 LAB — CANCER ANTIGEN 19-9: CA 19-9: 801 U/mL — ABNORMAL HIGH (ref 0–35)

## 2015-06-23 ENCOUNTER — Ambulatory Visit
Admission: RE | Admit: 2015-06-23 | Discharge: 2015-06-23 | Disposition: A | Discharge status: Home / Self Care | Payer: PPO | Source: Ambulatory Visit | Attending: Vascular Surgery | Admitting: Vascular Surgery

## 2015-06-23 ENCOUNTER — Encounter
Admission: RE | Disposition: A | Discharge status: Home / Self Care | Payer: Self-pay | Source: Ambulatory Visit | Attending: Vascular Surgery

## 2015-06-23 ENCOUNTER — Encounter: Payer: Self-pay | Admitting: Vascular Surgery

## 2015-06-23 DIAGNOSIS — K219 Gastro-esophageal reflux disease without esophagitis: Secondary | ICD-10-CM | POA: Insufficient documentation

## 2015-06-23 DIAGNOSIS — Z86718 Personal history of other venous thrombosis and embolism: Secondary | ICD-10-CM | POA: Insufficient documentation

## 2015-06-23 DIAGNOSIS — C259 Malignant neoplasm of pancreas, unspecified: Secondary | ICD-10-CM | POA: Diagnosis not present

## 2015-06-23 DIAGNOSIS — I252 Old myocardial infarction: Secondary | ICD-10-CM | POA: Insufficient documentation

## 2015-06-23 DIAGNOSIS — Z86010 Personal history of colonic polyps: Secondary | ICD-10-CM | POA: Insufficient documentation

## 2015-06-23 DIAGNOSIS — N361 Urethral diverticulum: Secondary | ICD-10-CM | POA: Insufficient documentation

## 2015-06-23 DIAGNOSIS — M720 Palmar fascial fibromatosis [Dupuytren]: Secondary | ICD-10-CM | POA: Insufficient documentation

## 2015-06-23 DIAGNOSIS — Z87891 Personal history of nicotine dependence: Secondary | ICD-10-CM | POA: Insufficient documentation

## 2015-06-23 DIAGNOSIS — I10 Essential (primary) hypertension: Secondary | ICD-10-CM | POA: Insufficient documentation

## 2015-06-23 DIAGNOSIS — E119 Type 2 diabetes mellitus without complications: Secondary | ICD-10-CM | POA: Insufficient documentation

## 2015-06-23 DIAGNOSIS — Z8612 Personal history of poliomyelitis: Secondary | ICD-10-CM | POA: Insufficient documentation

## 2015-06-23 DIAGNOSIS — Z79899 Other long term (current) drug therapy: Secondary | ICD-10-CM | POA: Insufficient documentation

## 2015-06-23 HISTORY — PX: PERIPHERAL VASCULAR CATHETERIZATION: SHX172C

## 2015-06-23 LAB — GLUCOSE, CAPILLARY: Glucose-Capillary: 181 mg/dL — ABNORMAL HIGH (ref 65–99)

## 2015-06-23 SURGERY — PORTA CATH INSERTION
Anesthesia: Moderate Sedation

## 2015-06-23 MED ORDER — LIDOCAINE-EPINEPHRINE (PF) 1 %-1:200000 IJ SOLN
INTRAMUSCULAR | Status: AC
  Filled 2015-06-23: qty 30

## 2015-06-23 MED ORDER — OXYCODONE-ACETAMINOPHEN 5-325 MG PO TABS: 1.0000 | ORAL_TABLET | ORAL | Status: DC | PRN

## 2015-06-23 MED ORDER — CHLORHEXIDINE GLUCONATE CLOTH 2 % EX PADS: 6.0000 | MEDICATED_PAD | Freq: Once | CUTANEOUS | Status: DC

## 2015-06-23 MED ORDER — HEPARIN (PORCINE) IN NACL 2-0.9 UNIT/ML-% IJ SOLN
INTRAMUSCULAR | Status: AC
  Filled 2015-06-23: qty 500

## 2015-06-23 MED ORDER — LIDOCAINE-EPINEPHRINE (PF) 1 %-1:200000 IJ SOLN
INTRAMUSCULAR | Status: DC | PRN
  Administered 2015-06-23: 10 mL via INTRADERMAL

## 2015-06-23 MED ORDER — MIDAZOLAM HCL 2 MG/2ML IJ SOLN
INTRAMUSCULAR | Status: DC | PRN
  Administered 2015-06-23: 2 mg via INTRAVENOUS

## 2015-06-23 MED ORDER — MIDAZOLAM HCL 5 MG/5ML IJ SOLN
INTRAMUSCULAR | Status: AC
  Filled 2015-06-23: qty 5

## 2015-06-23 MED ORDER — INSULIN ASPART 100 UNIT/ML ~~LOC~~ SOLN: 0.0000 [IU] | SUBCUTANEOUS | Status: DC

## 2015-06-23 MED ORDER — ALUM & MAG HYDROXIDE-SIMETH 200-200-20 MG/5ML PO SUSP: 15.0000 mL | ORAL | Status: DC | PRN

## 2015-06-23 MED ORDER — GUAIFENESIN-DM 100-10 MG/5ML PO SYRP: 15.0000 mL | ORAL_SOLUTION | ORAL | Status: DC | PRN

## 2015-06-23 MED ORDER — SODIUM CHLORIDE 0.9 % IV SOLN
INTRAVENOUS | Status: DC
  Administered 2015-06-23 (×2): via INTRAVENOUS

## 2015-06-23 MED ORDER — FENTANYL CITRATE (PF) 100 MCG/2ML IJ SOLN
INTRAMUSCULAR | Status: DC | PRN
  Administered 2015-06-23: 50 ug via INTRAVENOUS

## 2015-06-23 MED ORDER — SODIUM CHLORIDE 0.9 % IR SOLN
80.0000 mg | Freq: Once | Status: DC
  Filled 2015-06-23: qty 2

## 2015-06-23 MED ORDER — ACETAMINOPHEN 325 MG RE SUPP: 325.0000 mg | RECTAL | Status: DC | PRN

## 2015-06-23 MED ORDER — FENTANYL CITRATE (PF) 100 MCG/2ML IJ SOLN
INTRAMUSCULAR | Status: AC
  Filled 2015-06-23: qty 2

## 2015-06-23 MED ORDER — METOPROLOL TARTRATE 1 MG/ML IV SOLN: 2.0000 mg | INTRAVENOUS | Status: DC | PRN

## 2015-06-23 MED ORDER — PHENOL 1.4 % MT LIQD: 1.0000 | OROMUCOSAL | Status: DC | PRN

## 2015-06-23 MED ORDER — HYDRALAZINE HCL 20 MG/ML IJ SOLN: 5.0000 mg | INTRAMUSCULAR | Status: DC | PRN

## 2015-06-23 MED ORDER — MORPHINE SULFATE (PF) 4 MG/ML IV SOLN: 2.0000 mg | INTRAVENOUS | Status: DC | PRN

## 2015-06-23 MED ORDER — ACETAMINOPHEN 325 MG PO TABS: 325.0000 mg | ORAL_TABLET | ORAL | Status: DC | PRN

## 2015-06-23 MED ORDER — ONDANSETRON HCL 4 MG/2ML IJ SOLN: 4.0000 mg | Freq: Four times a day (QID) | INTRAMUSCULAR | Status: DC | PRN

## 2015-06-23 MED ORDER — CEFAZOLIN SODIUM 1-5 GM-% IV SOLN
INTRAVENOUS | Status: AC
  Filled 2015-06-23: qty 50

## 2015-06-23 MED ORDER — DEXTROSE 5 % IV SOLN
1.5000 g | INTRAVENOUS | Status: AC
  Administered 2015-06-23: 1.5 g via INTRAVENOUS
  Filled 2015-06-23: qty 1.5

## 2015-06-23 MED ORDER — LABETALOL HCL 5 MG/ML IV SOLN: 10.0000 mg | INTRAVENOUS | Status: DC | PRN

## 2015-06-23 SURGICAL SUPPLY — 9 items
ADH SKN CLS APL DERMABOND .7 (GAUZE/BANDAGES/DRESSINGS) ×1
COVER PROBE U/S 5X48 (MISCELLANEOUS) ×2 IMPLANT
DERMABOND ADVANCED (GAUZE/BANDAGES/DRESSINGS) ×2
DERMABOND ADVANCED .7 DNX12 (GAUZE/BANDAGES/DRESSINGS) IMPLANT
PACK ANGIOGRAPHY (CUSTOM PROCEDURE TRAY) ×2 IMPLANT
PAD GROUND ADULT SPLIT (MISCELLANEOUS) ×2 IMPLANT
PENCIL ELECTRO HAND CTR (MISCELLANEOUS) ×2 IMPLANT
PORTACATH POWER 8F (Port) ×2 IMPLANT
TOWEL OR 17X26 4PK STRL BLUE (TOWEL DISPOSABLE) ×2 IMPLANT

## 2015-06-23 NOTE — Discharge Instructions (Signed)

## 2015-06-23 NOTE — Op Note (Signed)
      [facility]       Operative Note  Date: 06/23/2015  Preoperative diagnosis:  1. Pancreatic cancer  Postoperative diagnosis:  Same as above  Procedures: #1. Ultrasound guidance for vascular access to the right internal jugular vein. #2. Fluoroscopic guidance for placement of catheter. #3. Placement of CT compatible Port-A-Cath, right internal jugular vein.  Surgeon: Leotis Pain, MD.   Anesthesia: Local with moderate conscious sedation.  Fluoroscopy time: less than 1 minute  Contrast used: 0  Estimated blood loss: minimal  Indication for the procedure:  Patient has pancreatic cancer.  The patient needs a Port-A-Cath for durable venous access, chemotherapy, lab draws, and CT scans. We are asked to place this. Risks and benefits were discussed and informed consent was obtained.  Description of procedure: The patient was brought to the vascular and interventional radiology suite. The right neck chest and shoulder were sterilely prepped and draped, and a sterile surgical field was created. Ultrasound was used to help visualize a patent right internal jugular vein. This was then accessed under direct ultrasound guidance without difficulty with the Seldinger needle and a permanent image was recorded. A J-wire was placed. After skin nick and dilatation, the peel-away sheath was then placed over the wire. I then anesthetized an area under the clavicle approximately 1-2 fingerbreadths. A transverse incision was created and an inferior pocket was created with electrocautery and blunt dissection. The port was then brought onto the field, placed into the pocket and secured to the chest wall with 2 Prolene sutures. The catheter was connected to the port and tunneled from the subclavicular incision to the access site. Fluoroscopic guidance was then used to cut the catheter to an appropriate length. The catheter was then placed through the peel-away sheath and the peel-away sheath  was removed. The catheter tip was parked in excellent location under fluorocoscopic guidance in the cavoatrial junction area. The pocket was then irrigated with antibiotic impregnated saline and the wound was closed with a running 3-0 Vicryl and a 4-0 Monocryl. The access incision was closed with a single 4-0 Monocryl. The Huber needle was used to withdraw blood and flush the port with heparinized saline. Dermabond was then placed as a dressing. The patient tolerated the procedure well and was taken to the recovery room in stable condition.   Nnaemeka Samson 06/23/2015 10:39 AM

## 2015-06-23 NOTE — H&P (Signed)
  [facility] History & Physical Update  The patient was interviewed and re-examined.  The patient's previous History and Physical has been reviewed and is unchanged.  There is no change in the plan of care. We plan to proceed with the scheduled procedure.  Rosemaria Inabinet, MD  06/23/2015, 9:48 AM

## 2015-06-27 NOTE — Progress Notes (Signed)
[place]  [phone] [phone]  ID: Ronnie Johnson OB: 06/10/1941  MR#: [id number]  MBT#:[id number]  Patient Care Team: Tonia Ghent, MD as PCP - General (Family Medicine) Hessie Knows, MD as Referring Physician (Orthopedic Surgery) Clent Jacks, RN as Registered Nurse  CHIEF COMPLAINT:  Chief Complaint  Patient presents with  . Pancreatic Cancer  . Chemotherapy    INTERVAL HISTORY: Patient returns to clinic today to discuss his imaging results initiation of cycle 1 , day 1 of gemcitabine and Abraxane. He continues to feel well and is asymptomatic. He denies any fevers.  He denies any chest pain or shortness of breath. He has occasional nausea, but denies any vomiting, constipation, or diarrhea. He has no urinary complaints. Patient offers no further specific complaints.  REVIEW OF SYSTEMS:   Review of Systems  Constitutional: Positive for weight loss and malaise/fatigue. Negative for fever.  Cardiovascular: Negative.   Gastrointestinal: Positive for nausea and abdominal pain.  Musculoskeletal: Negative.   Neurological: Positive for weakness.    As per HPI. Otherwise, a complete review of systems is negatve.  PAST MEDICAL HISTORY: Past Medical History  Diagnosis Date  . Hypertension   . DVT of leg (deep venous thrombosis) 2009  . Nephrolithiasis   . Hyperlipidemia   . Allergic rhinitis   . CAD (coronary artery disease)   . Urethral diverticulum   . Varicose vein of leg   . Dupuytren's contracture   . ED (erectile dysfunction)   . History of colonic polyps   . GERD (gastroesophageal reflux disease)   . Diverticulosis of colon   . Myocardial infarction   . DVT (deep venous thrombosis)   . Polio 1948    was hospitalized for 1 year  . Diabetes mellitus, type 2     Patient takes Metformin.  . SCC (squamous cell carcinoma)     scalp 2013 per derm    PAST SURGICAL HISTORY: Past Surgical History  Procedure  Laterality Date  . Appendectomy  1994  . Umbilical hernia repair  03/14/2005  . 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 Replacement total knee Left 2016  . Peripheral vascular catheterization N/A 06/23/2015    Procedure: [name];  Surgeon: Algernon Huxley, MD;  Location: [facility];  Service: Cardiovascular;  Laterality: N/A;    FAMILY HISTORY Family History  Problem Relation Age of Onset  . Diabetes Mother   . Hypertension Mother   . Stroke Mother   . Alzheimer's disease Mother   . Heart attack Father   . Hypertension Father   . Prostate cancer Brother   . Diabetes Brother   . Hypertension Brother   . Diabetes Sister   . Colon cancer Neg Hx   . Hypertension Daughter        ADVANCED DIRECTIVES:    HEALTH MAINTENANCE: Social History  Substance Use Topics  . Smoking status: Former Smoker    Quit date: 10/08/1988  . Smokeless tobacco: Never Used  . Alcohol Use: 0.0 oz/week    0 Standard drinks or equivalent per week     Comment: RARE     Colonoscopy:  PAP:  Bone density:  Lipid panel:  Allergies  Allergen Reactions  . Glimepiride Other (See Comments)    Leg stiffness  . Lipitor [Atorvastatin Calcium]     Tolerates crestor  . Oxycodone Other (See Comments)    Altered mental state  . Tape   .  Latex Rash    Current Outpatient Prescriptions  Medication Sig Dispense Refill  . aspirin EC 81 MG tablet Take 1 tablet (81 mg total) by mouth daily. 90 tablet 3  . carvedilol (COREG) 6.25 MG tablet Take 1 tablet (6.25 mg total) by mouth 2 (two) times daily with a meal. 180 tablet 3  . esomeprazole (NEXIUM) 20 MG capsule Take 20 mg by mouth daily as needed (HEARTBURN).    Marland Kitchen glucose blood test strip Use daily as instructed.  Dx E11.9 100 each 12  . Insulin Glargine (LANTUS SOLOSTAR) 100 UNIT/ML Solostar Pen Inject 5 units once a day.  Can add 1 unit per day if AM sugar is >150. 5 pen PRN  . Insulin Pen Needle 31G X 5 MM MISC Use daily with  insulin pen 100 each 3  . lidocaine-prilocaine (EMLA) cream Apply 1 application topically as needed. Apply to port 1 hour prior to chemotherapy appointment, cover with plastic wrap. 30 g 2  . lisinopril (PRINIVIL,ZESTRIL) 10 MG tablet Take 1 tablet (10 mg total) by mouth daily. 90 tablet 3  . metFORMIN (GLUCOPHAGE) 1000 MG tablet Take up to 1000mg  in the AM and 500mg  in the PM.  If GI upset, cut back to 1000mg  a day total.    . prochlorperazine (COMPAZINE) 10 MG tablet Take 1 tablet (10 mg total) by mouth every 6 (six) hours as needed for nausea or vomiting. 30 tablet 2  . rosuvastatin (CRESTOR) 20 MG tablet Take 1 tablet (20 mg total) by mouth daily. 90 tablet 3  . sitaGLIPtin (JANUVIA) 100 MG tablet Take 1 tablet (100 mg total) by mouth daily. 30 tablet 12   No current facility-administered medications for this visit.    OBJECTIVE: Filed Vitals:   06/21/15 0908  BP: 130/87  Pulse: 65  Temp: 96.6 F (35.9 C)  Resp: 18     Body mass index is 26.3 kg/(m^2).    ECOG FS:0 - Asymptomatic  General: Well-developed, well-nourished, no acute distress. Eyes: Pink conjunctiva, anicteric sclera. Lungs: Clear to auscultation bilaterally. Heart: Regular rate and rhythm. No rubs, murmurs, or gallops. Abdomen: Soft, nontender, nondistended. No organomegaly noted, normoactive bowel sounds. Musculoskeletal: No edema, cyanosis, or clubbing. Neuro: Alert, answering all questions appropriately. Cranial nerves grossly intact. Skin: No rashes or petechiae noted. Psych: Normal affect.    LAB RESULTS:  Lab Results  Component Value Date   NA 135 06/21/2015   K 4.3 06/21/2015   CL 102 06/21/2015   CO2 28 06/21/2015   GLUCOSE 260* 06/21/2015   BUN 16 06/21/2015   CREATININE 0.77 06/21/2015   CALCIUM 8.9 06/21/2015   PROT 7.0 06/21/2015   ALBUMIN 4.2 06/21/2015   AST 13* 06/21/2015   ALT 10* 06/21/2015   ALKPHOS 57 06/21/2015   BILITOT 0.8 06/21/2015   GFRNONAA >60 06/21/2015   GFRAA >60  06/21/2015    Lab Results  Component Value Date   WBC 6.3 06/21/2015   NEUTROABS 4.7 06/21/2015   HGB 14.6 06/21/2015   HCT 45.0 06/21/2015   MCV 78.6* 06/21/2015   PLT 160 06/21/2015     STUDIES: Mr Abdomen W Wo Contrast  06/01/2015   CLINICAL DATA:  Subsequent encounter for pancreatic head mass.  EXAM: MRI ABDOMEN WITHOUT AND WITH CONTRAST  TECHNIQUE: Multiplanar multisequence MR imaging of the abdomen was performed both before and after the administration of intravenous contrast.  CONTRAST:  73mL MULTIHANCE GADOBENATE DIMEGLUMINE 529 MG/ML IV SOLN  COMPARISON:  CT scan from 05/31/2015.  FINDINGS: Lower chest:  Unremarkable.  Hepatobiliary: 12 mm T1 hypo intense, T2 intermediate intensity lesion is identified in the extreme dome of the liver (see image 13 series 14). This appears to have some peripheral irregular enhancement. T2 signal intensity in this lesion is not as high as would be expected for a cyst or benign biliary MR Ephriam Jenkins. 3 mm lesion in the medial segment left liver has high signal intensity on T2 weighted imaging and no definite enhancement after IV contrast administration and is probably a tiny biliary hematoma.  There is no evidence for gallstones, gallbladder wall thickening, or pericholecystic fluid. No intrahepatic or extrahepatic biliary dilation.  Pancreas: A bilobed mass lesion is identified in the pancreas, at the head body junction.  Size: 2.7 x 5.1 x 2.9 cm  Location: Pancreatic neck.  Characterization: The lesion has low signal intensity on T1 weighted imaging and intermediate signal intensity on T2 weighted imaging. T2 intensity is not as high as would be expected for a cystic lesion.  Enhancement: The lesion demonstrates a thin rim of peripheral irregular enhancement compatible with substantial central necrosis. The lesion obliterates the main pancreatic duct in the head/ neck of the pancreas and there is mild ductal dilatation in the body and tail.  Local extent of mass:  Anterior extent of the lesion is to the inferior and posterior wall of the gastric antrum. There is no evidence for gastric wall thickening to suggest direct tumor invasion of the distal stomach wall. This is in the region of the pylorus. No evidence for tumor extension to the descending portion of the duodenum.  Vascular Involvement: Posteriorly, the lesion abuts the portal splenic confluence and superior mesenteric vein. Tumor is contiguous with the portosplenic confluence over a length of approximately 19 mm. There is no encasement of the portal vein, superior mesenteric vein, or splenic vein. All 3 of these venous structures remain patent although there is some mild mass-effect on the cranial aspect of the superior mesenteric vein.  Variant hepatic artery anatomy: Some motion degradation makes assessment of the hepatic arteries difficult, but there is no evidence for a replaced right hepatic artery arising from the SMA. Fat planes around the celiac axis are preserved. Fat planes around the superior mesenteric artery are preserved.  Bile Duct Involvement: No evidence for involvement of the common bile duct.  Variant biliary anatomy: None visualized.  Adjacent Nodes: 17 x 20 mm necrotic lymph node is identified in the anterior para renal space, just cranial to the proximal aspect of the splenic artery.  Omental/Peritoneal Disease: None visualized within the visualized abdomen.  Spleen: Normal MR imaging features.  Adrenals/Urinary Tract: No adrenal nodule or mass. Small simple cyst noted in the left kidney. No evidence for enhancing renal mass although neither kidney was completely visualized on postcontrast imaging.  Stomach/Bowel: Stomach is nondistended. No gastric wall thickening. No evidence of outlet obstruction. Duodenum is normally positioned as is the ligament of Treitz. Visualized small bowel of the abdomen shows no dilatation.  Vascular/Lymphatic: No abdominal aortic aneurysm. Please see pancreas  section above.  Other:  No intraperitoneal free fluid.  Musculoskeletal: No abnormal marrow enhancement within the visualized bony anatomy.  IMPRESSION: 2.7 x 5.1 x 2.9 cm necrotic mass in the neck of pancreas abuts the portal splenic confluence in generates some mild mass-effect on the cranial aspect of the SMV. Imaging features are highly concerning for adenocarcinoma. There is an adjacent necrotic retroperitoneal lymph node, compatible with metastatic disease. Endoscopic ultrasound may prove helpful  to further evaluate.  12 mm ill-defined lesion in the dome of liver has apparent irregular rim enhancement. Although not definite, this finding is suspicious for metastatic spread to the liver.   Electronically Signed   By: Misty Stanley M.D.   On: 06/01/2015 09:36   Ct Abdomen Pelvis W Contrast  05/31/2015   CLINICAL DATA:  Generalized abdominal pain, 30 pounds weight loss last 2 months new  EXAM: CT ABDOMEN AND PELVIS WITH CONTRAST  TECHNIQUE: Multidetector CT imaging of the abdomen and pelvis was performed using the standard protocol following bolus administration of intravenous contrast.  CONTRAST:  174mL OMNIPAQUE IOHEXOL 300 MG/ML  SOLN  COMPARISON:  02/03/2014  FINDINGS: Sagittal images of the spine shows osteopenia and degenerative changes thoracolumbar spine. The visualized lung bases are unremarkable. Mild fatty infiltration of the liver. Subtle low-density lesion in mid aspect of right hepatic lobe axial image 8 measures 1.2 cm. This is indeterminate and further correlation is recommended. No calcified gallstones are noted within gallbladder.  There is a lobulated cystic lesion in the pancreatic head region measures at least 4 cm. This is highly suspicious for cystic neoplasm. There is some wall retraction and I suspect there is a mural component. Further correlation with MRI is recommended to exclude intraductal papillary carcinoma. Mild dilatation of main pancreatic duct in body of the tail of the  pancreas up to 4 mm. In axial image 20 there is a lymph node just posterior to pancreas and above the splenic artery measures 1.9 cm. Metastatic disease cannot be excluded. The adrenal glands are unremarkable. Kidneys are symmetrical in size and enhancement. No aortic aneurysm. No hydronephrosis or hydroureter.  Small left renal cysts are noted the largest measures 8.6 mm.  No small bowel obstruction. No ascites or free air. No adenopathy. The terminal ileum is unremarkable. No pericecal inflammation. The patient is status post appendectomy. Colonic diverticula are noted descending colon. Multiple sigmoid colon diverticula. No evidence of acute diverticulitis. Moderate gas noted within rectum. Prostate gland and seminal vesicles are unremarkable. The urinary bladder is unremarkable. No inguinal adenopathy. Mild degenerative changes SI joints.  Delayed renal images shows bilateral renal symmetrical excretion.  There is tiny supraumbilical midline hernia containing fat without evidence of acute complication  IMPRESSION: 1. There is irregular cystic neoplastic lesion in pancreatic head region measures at least 4 cm. Further correlation with MRI pancreatic lesion protocol is recommended to exclude malignancy. 2. A lymph node just posterior and inferior to pancreas measures 1.9 cm. Metastatic disease cannot be excluded. 3. Question right hepatic lobe lesion measures 1.1 cm. Attention should be given on MRI to exclude metastatic disease. 4. No small bowel obstruction. 5. No hydronephrosis or hydroureter. 6. Distal colonic diverticulosis. No evidence of acute diverticulitis. These results were called by telephone at the time of interpretation on 05/31/2015 at 12:02 pm to Dr. Elsie Stain , who verbally acknowledged these results.   Electronically Signed   By: Lahoma Crocker M.D.   On: 05/31/2015 12:02   Nm Pet Image Initial (pi) Skull Base To Thigh  06/20/2015   CLINICAL DATA:  Initial treatment strategy for pancreatic  adenocarcinoma.  EXAM: NUCLEAR MEDICINE PET SKULL BASE TO THIGH  TECHNIQUE: 12.4 mCi F-18 FDG was injected intravenously. Full-ring PET imaging was performed from the skull base to thigh after the radiotracer. CT data was obtained and used for attenuation correction and anatomic localization.  FASTING BLOOD GLUCOSE:  Value: 157 mg/dl  COMPARISON:  MRI 06/01/2015, CT 05/31/2015  FINDINGS:  NECK  No hypermetabolic lymph nodes in the neck.  CHEST  No hypermetabolic mediastinal or hilar nodes. No suspicious pulmonary nodules on the CT scan.  ABDOMEN/PELVIS  Small hypermetabolic focus in the dome of the RIGHT hepatic lobe with SUV max equal 3.0 (image 216 of the fused data set) corresponds to the irregular enhancing lesion on comparison MRI. No additional hypermetabolic foci within the liver.  There is mild to moderate metabolic activity associated with the pancreatic parenchymal lesion with SUV max equal 3.3 on image 190 fused data set. There is mild peripheral metabolic activity of the necrotic lymph node inferior to the pancreatic head with SUV max equal 3.7. Additional metastatic lymph node adjacent to the splenic vein and pancreatic lesion measures 14 mm on image 136 with similar mild metabolic activity(SUV max 3.3).  No additional hypermetabolic abdominal pelvic lymph nodes. No abnormal metabolic activity within the omentum or peritoneal surface.  SKELETON  No focal hypermetabolic activity to suggest skeletal metastasis.  IMPRESSION: 1. Mild to moderate metabolic activity associated with primary pancreatic adenocarcinoma and two adjacent metastatic lymph nodes. 2. Single focus of abnormal metabolic activity within the superior RIGHT hepatic lobe corresponds to enhancing lesion on comparison MRI and is consistent with a hepatic metastasis.   Electronically Signed   By: Suzy Bouchard M.D.   On: 06/20/2015 11:59    ASSESSMENT: Stage IV adenocarcinoma the pancreas.  PLAN:    1. Pancreatic cancer: Patient's  imaging and pathology results from [facility] reviewed independently confirming stage IV disease.  PET scan results also reviewed independently and reported as with likely metastatic disease in patient's liver. Patient's most recent CA-19-9 on June 06, 2015 was reported at 764.  Proceed with cycle 1, day 1 of gemcitabine and Abraxane. Patient will receive this regimen on days 1, 8, and 15 with day 22 off. Will plan to reimage after 3 cycles.  Patient has not yet received his port, but has an appointment on Thursday. Return to clinic in 1 week for consideration of cycle 1, day 8.  Patient expressed understanding and was in agreement with this plan. He also understands that He can call clinic at any time with any questions, concerns, or complaints.   Lloyd Huger, MD   06/27/2015 2:05 PM

## 2015-06-28 ENCOUNTER — Inpatient Hospital Stay: Payer: PPO

## 2015-06-28 ENCOUNTER — Encounter: Payer: Self-pay | Admitting: Family Medicine

## 2015-06-28 ENCOUNTER — Other Ambulatory Visit: Payer: PPO

## 2015-06-28 ENCOUNTER — Inpatient Hospital Stay (HOSPITAL_BASED_OUTPATIENT_CLINIC_OR_DEPARTMENT_OTHER): Payer: PPO | Admitting: Oncology

## 2015-06-28 ENCOUNTER — Ambulatory Visit (INDEPENDENT_AMBULATORY_CARE_PROVIDER_SITE_OTHER): Payer: PPO | Admitting: Family Medicine

## 2015-06-28 VITALS — BP 125/76 | HR 65 | Temp 96.7°F | Resp 16 | Wt 169.3 lb

## 2015-06-28 VITALS — BP 104/52 | HR 74 | Temp 98.0°F | Wt 170.0 lb

## 2015-06-28 DIAGNOSIS — E119 Type 2 diabetes mellitus without complications: Secondary | ICD-10-CM

## 2015-06-28 DIAGNOSIS — D696 Thrombocytopenia, unspecified: Secondary | ICD-10-CM | POA: Diagnosis not present

## 2015-06-28 DIAGNOSIS — E1165 Type 2 diabetes mellitus with hyperglycemia: Secondary | ICD-10-CM

## 2015-06-28 DIAGNOSIS — C787 Secondary malignant neoplasm of liver and intrahepatic bile duct: Secondary | ICD-10-CM

## 2015-06-28 DIAGNOSIS — Z5111 Encounter for antineoplastic chemotherapy: Secondary | ICD-10-CM | POA: Diagnosis not present

## 2015-06-28 DIAGNOSIS — C259 Malignant neoplasm of pancreas, unspecified: Secondary | ICD-10-CM | POA: Diagnosis not present

## 2015-06-28 DIAGNOSIS — I10 Essential (primary) hypertension: Secondary | ICD-10-CM | POA: Diagnosis not present

## 2015-06-28 DIAGNOSIS — R109 Unspecified abdominal pain: Secondary | ICD-10-CM

## 2015-06-28 DIAGNOSIS — R5383 Other fatigue: Secondary | ICD-10-CM

## 2015-06-28 DIAGNOSIS — R110 Nausea: Secondary | ICD-10-CM

## 2015-06-28 DIAGNOSIS — R531 Weakness: Secondary | ICD-10-CM

## 2015-06-28 DIAGNOSIS — I252 Old myocardial infarction: Secondary | ICD-10-CM

## 2015-06-28 DIAGNOSIS — IMO0001 Reserved for inherently not codable concepts without codable children: Secondary | ICD-10-CM

## 2015-06-28 DIAGNOSIS — R5381 Other malaise: Secondary | ICD-10-CM

## 2015-06-28 DIAGNOSIS — I2510 Atherosclerotic heart disease of native coronary artery without angina pectoris: Secondary | ICD-10-CM

## 2015-06-28 LAB — CBC WITH DIFFERENTIAL/PLATELET
BASOS PCT: 0 %
Basophils Absolute: 0 10*3/uL (ref 0–0.1)
EOS PCT: 2 %
Eosinophils Absolute: 0.1 10*3/uL (ref 0–0.7)
HEMATOCRIT: 38.7 % — AB (ref 40.0–52.0)
Hemoglobin: 12.5 g/dL — ABNORMAL LOW (ref 13.0–18.0)
Lymphocytes Relative: 17 %
Lymphs Abs: 0.7 10*3/uL — ABNORMAL LOW (ref 1.0–3.6)
MCH: 25.6 pg — ABNORMAL LOW (ref 26.0–34.0)
MCHC: 32.4 g/dL (ref 32.0–36.0)
MCV: 79.1 fL — AB (ref 80.0–100.0)
MONO ABS: 0.3 10*3/uL (ref 0.2–1.0)
MONOS PCT: 7 %
NEUTROS ABS: 3.3 10*3/uL (ref 1.4–6.5)
Neutrophils Relative %: 74 %
PLATELETS: 108 10*3/uL — AB (ref 150–440)
RBC: 4.89 MIL/uL (ref 4.40–5.90)
RDW: 14.3 % (ref 11.5–14.5)
WBC: 4.4 10*3/uL (ref 3.8–10.6)

## 2015-06-28 LAB — COMPREHENSIVE METABOLIC PANEL
ALT: 11 U/L — ABNORMAL LOW (ref 17–63)
ANION GAP: 7 (ref 5–15)
AST: 19 U/L (ref 15–41)
Albumin: 3.4 g/dL — ABNORMAL LOW (ref 3.5–5.0)
Alkaline Phosphatase: 59 U/L (ref 38–126)
BILIRUBIN TOTAL: 0.6 mg/dL (ref 0.3–1.2)
BUN: 12 mg/dL (ref 6–20)
CHLORIDE: 104 mmol/L (ref 101–111)
CO2: 28 mmol/L (ref 22–32)
Calcium: 8.7 mg/dL — ABNORMAL LOW (ref 8.9–10.3)
Creatinine, Ser: 0.72 mg/dL (ref 0.61–1.24)
GFR calc Af Amer: >60 mL/min (ref >60)
Glucose, Bld: 235 mg/dL — ABNORMAL HIGH (ref 65–99)
POTASSIUM: 3.8 mmol/L (ref 3.5–5.1)
Sodium: 139 mmol/L (ref 135–145)
TOTAL PROTEIN: 6.5 g/dL (ref 6.5–8.1)

## 2015-06-28 MED ORDER — HEPARIN SOD (PORK) LOCK FLUSH 100 UNIT/ML IV SOLN
500.0000 [IU] | Freq: Once | INTRAVENOUS | Status: AC | PRN
  Administered 2015-06-28: 500 [IU]
  Filled 2015-06-28: qty 5

## 2015-06-28 MED ORDER — DEXAMETHASONE SODIUM PHOSPHATE 100 MG/10ML IJ SOLN
Freq: Once | INTRAMUSCULAR | Status: AC
  Administered 2015-06-28: 11:00:00 via INTRAVENOUS
  Filled 2015-06-28: qty 4

## 2015-06-28 MED ORDER — SODIUM CHLORIDE 0.9 % IV SOLN
Freq: Once | INTRAVENOUS | Status: AC
  Administered 2015-06-28: 11:00:00 via INTRAVENOUS
  Filled 2015-06-28: qty 1000

## 2015-06-28 MED ORDER — PACLITAXEL PROTEIN-BOUND CHEMO INJECTION 100 MG
125.0000 mg/m2 | Freq: Once | INTRAVENOUS | Status: AC
  Administered 2015-06-28: 250 mg via INTRAVENOUS
  Filled 2015-06-28: qty 50

## 2015-06-28 MED ORDER — SODIUM CHLORIDE 0.9 % IV SOLN
2000.0000 mg | Freq: Once | INTRAVENOUS | Status: AC
  Administered 2015-06-28: 2000 mg via INTRAVENOUS
  Filled 2015-06-28: qty 52.6

## 2015-06-28 MED ORDER — ROSUVASTATIN CALCIUM 20 MG PO TABS: 10.0000 mg | ORAL_TABLET | Freq: Every day | ORAL | Status: DC

## 2015-06-28 MED ORDER — SODIUM CHLORIDE 0.9 % IJ SOLN
10.0000 mL | INTRAMUSCULAR | Status: DC | PRN
  Administered 2015-06-28: 10 mL
  Filled 2015-06-28: qty 10

## 2015-06-28 NOTE — Progress Notes (Signed)
Pre visit review using our clinic review tool, if applicable. No additional management support is needed unless otherwise documented below in the visit note.  H/o pancreatic cancer, s/p 1 round of tx.  Has f/u this AM.   He had some aching and itching with chemo but that was mild.  No rash.   No fevers.  No vomiting.   Slightly lightheaded, some mild nausea.   Sleeping well.   Has port on R side of chest.  Not irritated, no painful.  D/w pt about keeping it clean.  Sugars have been variable, usually up, 200-300.  No ade on insulin recently started.   D/w pt about titration of insulin slowly.  No low sugars since start.    Meds, vitals, and allergies reviewed.   ROS: See HPI.  Otherwise, noncontributory.  nad ncat Mmm Neck supple, no LA rrr ctab Port on R side of chest dressed abd soft Ext w/o edema.

## 2015-06-28 NOTE — Patient Instructions (Addendum)
If you are lightheaded, then skip the lisinopril or cut the dose in half.   Give 5 units of insulin tonight and then continue PM dosing starting tomorrow.  Restart tomorrow night with 8 units.  Add 1 unit of insulin a day if your AM sugar is >150.  If below 120, then cut back 1 unit.  If 120-150, then give the same dose as yesterday.  Update me next week.  Take care.  Glad to see you.

## 2015-06-28 NOTE — Progress Notes (Signed)
Patient has a rash on his arms that started this week.

## 2015-06-29 NOTE — Assessment & Plan Note (Signed)
If lightheaded, okay to dec or skip ACE if needed.  He'll update me as needed.

## 2015-06-29 NOTE — Assessment & Plan Note (Signed)
He is in the midst of changing from AM to PM dosing of insulin.  Give 5 units of insulin tonight and then continue PM dosing starting tomorrow.  Restart tomorrow night with 8 units.  Add 1 unit of insulin a day if your AM sugar is >150.  If below 120, then cut back 1 unit.  If 120-150, then give the same dose as yesterday.  Update me next week.  He agrees.  Continue with diet as tolerated.   >25 minutes spent in face to face time with patient, >50% spent in counselling or coordination of care.

## 2015-06-29 NOTE — Assessment & Plan Note (Signed)
Per onc  

## 2015-07-04 ENCOUNTER — Telehealth: Payer: Self-pay | Admitting: *Deleted

## 2015-07-04 NOTE — Telephone Encounter (Signed)
Please call them back.  Keep adding 1 unit a day if needed.  Add 1 unit of insulin a day if your AM sugar is >150.  If below 120, then cut back 1 unit.  If 120-150, then give the same dose as yesterday.   The good news if that his sugar hasn't dropped precipitously- I didn't want that to happen.  He may have to gradually work up to 20+ units, 1 unit per day at a time should his sugar require it.  Thanks.

## 2015-07-04 NOTE — Telephone Encounter (Signed)
Patient's wife left a voicemail stating that patient is very upset about the insulin that he has recently started. Mrs. Vossler stated that the patient is up to 9 units and his blood sugar is still above 150. Patient's wife stated that Dr. Damita Dunnings told them to call with any concerns and that he would call them back and talk with them. Patient's wife is aware that Dr. Damita Dunnings is out today, but would appreciate a call back when he returns.

## 2015-07-05 ENCOUNTER — Inpatient Hospital Stay: Payer: PPO

## 2015-07-05 ENCOUNTER — Inpatient Hospital Stay (HOSPITAL_BASED_OUTPATIENT_CLINIC_OR_DEPARTMENT_OTHER): Payer: PPO | Admitting: Oncology

## 2015-07-05 DIAGNOSIS — I2510 Atherosclerotic heart disease of native coronary artery without angina pectoris: Secondary | ICD-10-CM

## 2015-07-05 DIAGNOSIS — C787 Secondary malignant neoplasm of liver and intrahepatic bile duct: Principal | ICD-10-CM

## 2015-07-05 DIAGNOSIS — R110 Nausea: Secondary | ICD-10-CM

## 2015-07-05 DIAGNOSIS — R5383 Other fatigue: Secondary | ICD-10-CM

## 2015-07-05 DIAGNOSIS — D696 Thrombocytopenia, unspecified: Secondary | ICD-10-CM

## 2015-07-05 DIAGNOSIS — C259 Malignant neoplasm of pancreas, unspecified: Secondary | ICD-10-CM

## 2015-07-05 DIAGNOSIS — I10 Essential (primary) hypertension: Secondary | ICD-10-CM

## 2015-07-05 DIAGNOSIS — R531 Weakness: Secondary | ICD-10-CM

## 2015-07-05 DIAGNOSIS — Z5111 Encounter for antineoplastic chemotherapy: Secondary | ICD-10-CM | POA: Diagnosis not present

## 2015-07-05 DIAGNOSIS — I252 Old myocardial infarction: Secondary | ICD-10-CM

## 2015-07-05 DIAGNOSIS — E119 Type 2 diabetes mellitus without complications: Secondary | ICD-10-CM

## 2015-07-05 DIAGNOSIS — R5381 Other malaise: Secondary | ICD-10-CM

## 2015-07-05 DIAGNOSIS — E785 Hyperlipidemia, unspecified: Secondary | ICD-10-CM

## 2015-07-05 LAB — COMPREHENSIVE METABOLIC PANEL
ALBUMIN: 3.4 g/dL — AB (ref 3.5–5.0)
ALK PHOS: 52 U/L (ref 38–126)
ALT: 13 U/L — ABNORMAL LOW (ref 17–63)
ANION GAP: 6 (ref 5–15)
AST: 14 U/L — ABNORMAL LOW (ref 15–41)
BUN: 13 mg/dL (ref 6–20)
CHLORIDE: 105 mmol/L (ref 101–111)
CO2: 28 mmol/L (ref 22–32)
Calcium: 8.3 mg/dL — ABNORMAL LOW (ref 8.9–10.3)
Creatinine, Ser: 0.73 mg/dL (ref 0.61–1.24)
GFR calc non Af Amer: >60 mL/min (ref >60)
GLUCOSE: 228 mg/dL — AB (ref 65–99)
POTASSIUM: 3.8 mmol/L (ref 3.5–5.1)
SODIUM: 139 mmol/L (ref 135–145)
Total Bilirubin: 0.4 mg/dL (ref 0.3–1.2)
Total Protein: 6.2 g/dL — ABNORMAL LOW (ref 6.5–8.1)

## 2015-07-05 LAB — CBC WITH DIFFERENTIAL/PLATELET
BASOS PCT: 0 %
Basophils Absolute: 0 10*3/uL (ref 0–0.1)
EOS ABS: 0.1 10*3/uL (ref 0–0.7)
EOS PCT: 3 %
HCT: 39.4 % — ABNORMAL LOW (ref 40.0–52.0)
HEMOGLOBIN: 12.8 g/dL — AB (ref 13.0–18.0)
Lymphocytes Relative: 24 %
Lymphs Abs: 0.9 10*3/uL — ABNORMAL LOW (ref 1.0–3.6)
MCH: 25.7 pg — AB (ref 26.0–34.0)
MCHC: 32.5 g/dL (ref 32.0–36.0)
MCV: 79 fL — ABNORMAL LOW (ref 80.0–100.0)
MONOS PCT: 10 %
Monocytes Absolute: 0.4 10*3/uL (ref 0.2–1.0)
NEUTROS PCT: 63 %
Neutro Abs: 2.4 10*3/uL (ref 1.4–6.5)
PLATELETS: 114 10*3/uL — AB (ref 150–440)
RBC: 4.99 MIL/uL (ref 4.40–5.90)
RDW: 14.1 % (ref 11.5–14.5)
WBC: 3.8 10*3/uL (ref 3.8–10.6)

## 2015-07-05 MED ORDER — SODIUM CHLORIDE 0.9 % IV SOLN
Freq: Once | INTRAVENOUS | Status: AC
  Administered 2015-07-05: 11:00:00 via INTRAVENOUS
  Filled 2015-07-05: qty 1000

## 2015-07-05 MED ORDER — SODIUM CHLORIDE 0.9 % IV SOLN
Freq: Once | INTRAVENOUS | Status: AC
  Administered 2015-07-05: 11:00:00 via INTRAVENOUS
  Filled 2015-07-05: qty 4

## 2015-07-05 MED ORDER — PACLITAXEL PROTEIN-BOUND CHEMO INJECTION 100 MG
125.0000 mg/m2 | Freq: Once | INTRAVENOUS | Status: AC
  Administered 2015-07-05: 250 mg via INTRAVENOUS
  Filled 2015-07-05: qty 50

## 2015-07-05 MED ORDER — HEPARIN SOD (PORK) LOCK FLUSH 100 UNIT/ML IV SOLN
500.0000 [IU] | Freq: Once | INTRAVENOUS | Status: AC | PRN
  Administered 2015-07-05: 500 [IU]

## 2015-07-05 MED ORDER — SODIUM CHLORIDE 0.9 % IJ SOLN
10.0000 mL | INTRAMUSCULAR | Status: DC | PRN
  Filled 2015-07-05: qty 10

## 2015-07-05 MED ORDER — GEMCITABINE HCL CHEMO INJECTION 1 GM/26.3ML
2000.0000 mg | Freq: Once | INTRAVENOUS | Status: AC
  Administered 2015-07-05: 2000 mg via INTRAVENOUS
  Filled 2015-07-05: qty 52.63

## 2015-07-05 NOTE — Telephone Encounter (Signed)
Left message for patient to return my call.

## 2015-07-05 NOTE — Progress Notes (Signed)
[facility]  [phone]) [phone] Fax:(336) [phone]  ID: Ronnie Johnson OB: Apr 03, 1941  MR#: [id number]  HXT#:[id number]  Patient Care Team: Tonia Ghent, MD as PCP - General (Family Medicine) Hessie Knows, MD as Referring Physician (Orthopedic Surgery) Clent Jacks, RN as Registered Nurse  CHIEF COMPLAINT:  Chief Complaint  Patient presents with  . Follow-up    pancreatic cancer    INTERVAL HISTORY: Patient returns to clinic today for further evaluation and consideration of cycle 1, day 8 of gemcitabine and Abraxane. He tolerated his first infusion well without significant side effects. He finally has had a port placed and had several days of lethargy after that, but is now back to his baseline. He denies any fevers.  He denies any chest pain or shortness of breath. He has occasional nausea, but denies any vomiting, constipation, or diarrhea. He has no urinary complaints. Patient offers no further specific complaints.  REVIEW OF SYSTEMS:   Review of Systems  Constitutional: Positive for malaise/fatigue. Negative for fever and weight loss.  Cardiovascular: Negative.   Gastrointestinal: Positive for nausea. Negative for abdominal pain.  Musculoskeletal: Negative.   Neurological: Positive for weakness.    As per HPI. Otherwise, a complete review of systems is negatve.  PAST MEDICAL HISTORY: Past Medical History  Diagnosis Date  . Hypertension   . DVT of leg (deep venous thrombosis) 2009  . Nephrolithiasis   . Hyperlipidemia   . Allergic rhinitis   . CAD (coronary artery disease)   . Urethral diverticulum   . Varicose vein of leg   . Dupuytren's contracture   . ED (erectile dysfunction)   . History of colonic polyps   . GERD (gastroesophageal reflux disease)   . Diverticulosis of colon   . Myocardial infarction   . DVT (deep venous thrombosis)   . Polio 1948    was hospitalized for 1 year  . Diabetes mellitus, type 2     Patient takes  Metformin.  . SCC (squamous cell carcinoma)     scalp 2013 per derm    PAST SURGICAL HISTORY: Past Surgical History  Procedure Laterality Date  . Appendectomy  1994  . Umbilical hernia repair  03/14/2005  . 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 Replacement total knee Left 2016  . Peripheral vascular catheterization N/A 06/23/2015    Procedure: [name];  Surgeon: Algernon Huxley, MD;  Location: [facility];  Service: Cardiovascular;  Laterality: N/A;    FAMILY HISTORY Family History  Problem Relation Age of Onset  . Diabetes Mother   . Hypertension Mother   . Stroke Mother   . Alzheimer's disease Mother   . Heart attack Father   . Hypertension Father   . Prostate cancer Brother   . Diabetes Brother   . Hypertension Brother   . Diabetes Sister   . Colon cancer Neg Hx   . Hypertension Daughter        ADVANCED DIRECTIVES:    HEALTH MAINTENANCE: Social History  Substance Use Topics  . Smoking status: Former Smoker    Quit date: 10/08/1988  . Smokeless tobacco: Never Used  . Alcohol Use: 0.0 oz/week    0 Standard drinks or equivalent per week     Comment: RARE     Colonoscopy:  PAP:  Bone density:  Lipid panel:  Allergies  Allergen Reactions  . Glimepiride Other (See Comments)    Leg stiffness  . Lipitor [  Atorvastatin Calcium]     Tolerates crestor  . Oxycodone Other (See Comments)    Altered mental state  . Tape   . Latex Rash    Current Outpatient Prescriptions  Medication Sig Dispense Refill  . aspirin EC 81 MG tablet Take 1 tablet (81 mg total) by mouth daily. 90 tablet 3  . carvedilol (COREG) 6.25 MG tablet Take 1 tablet (6.25 mg total) by mouth 2 (two) times daily with a meal. 180 tablet 3  . esomeprazole (NEXIUM) 20 MG capsule Take 20 mg by mouth daily as needed (HEARTBURN).    . Insulin Glargine (LANTUS SOLOSTAR) 100 UNIT/ML Solostar Pen Inject 5 units once a day.  Can add 1 unit per day if AM sugar is >150. 5 pen PRN   . lidocaine-prilocaine (EMLA) cream Apply 1 application topically as needed. Apply to port 1 hour prior to chemotherapy appointment, cover with plastic wrap. 30 g 2  . lisinopril (PRINIVIL,ZESTRIL) 10 MG tablet Take 1 tablet (10 mg total) by mouth daily. 90 tablet 3  . metFORMIN (GLUCOPHAGE) 1000 MG tablet Take up to 1000mg  in the AM and 500mg  in the PM.  If GI upset, cut back to 1000mg  a day total.    . prochlorperazine (COMPAZINE) 10 MG tablet Take 1 tablet (10 mg total) by mouth every 6 (six) hours as needed for nausea or vomiting. 30 tablet 2  . rosuvastatin (CRESTOR) 20 MG tablet Take 0.5 tablets (10 mg total) by mouth daily.    . sitaGLIPtin (JANUVIA) 100 MG tablet Take 1 tablet (100 mg total) by mouth daily. 30 tablet 12   No current facility-administered medications for this visit.   Facility-Administered Medications Ordered in Other Visits  Medication Dose Route Frequency Provider Last Rate Last Dose  . heparin lock flush 100 unit/mL  500 Units Intracatheter Once PRN Lloyd Huger, MD      . sodium chloride 0.9 % injection 10 mL  10 mL Intracatheter PRN Lloyd Huger, MD        OBJECTIVE: Filed Vitals:   06/28/15 1113  BP: 125/76  Pulse: 65  Temp: 96.7 F (35.9 C)  Resp: 16     Body mass index is 26.51 kg/(m^2).    ECOG FS:0 - Asymptomatic  General: Well-developed, well-nourished, no acute distress. Eyes: Pink conjunctiva, anicteric sclera. Lungs: Clear to auscultation bilaterally. Heart: Regular rate and rhythm. No rubs, murmurs, or gallops. Abdomen: Soft, nontender, nondistended. No organomegaly noted, normoactive bowel sounds. Musculoskeletal: No edema, cyanosis, or clubbing. Neuro: Alert, answering all questions appropriately. Cranial nerves grossly intact. Skin: No rashes or petechiae noted. Psych: Normal affect.    LAB RESULTS:  Lab Results  Component Value Date   NA 139 07/05/2015   K 3.8 07/05/2015   CL 105 07/05/2015   CO2 28 07/05/2015    GLUCOSE 228* 07/05/2015   BUN 13 07/05/2015   CREATININE 0.73 07/05/2015   CALCIUM 8.3* 07/05/2015   PROT 6.2* 07/05/2015   ALBUMIN 3.4* 07/05/2015   AST 14* 07/05/2015   ALT 13* 07/05/2015   ALKPHOS 52 07/05/2015   BILITOT 0.4 07/05/2015   GFRNONAA >60 07/05/2015   GFRAA >60 07/05/2015    Lab Results  Component Value Date   WBC 3.8 07/05/2015   NEUTROABS 2.4 07/05/2015   HGB 12.8* 07/05/2015   HCT 39.4* 07/05/2015   MCV 79.0* 07/05/2015   PLT 114* 07/05/2015     STUDIES: Nm Pet Image Initial (pi) Skull Base To Thigh  06/20/2015  CLINICAL DATA:  Initial treatment strategy for pancreatic adenocarcinoma.  EXAM: NUCLEAR MEDICINE PET SKULL BASE TO THIGH  TECHNIQUE: 12.4 mCi F-18 FDG was injected intravenously. Full-ring PET imaging was performed from the skull base to thigh after the radiotracer. CT data was obtained and used for attenuation correction and anatomic localization.  FASTING BLOOD GLUCOSE:  Value: 157 mg/dl  COMPARISON:  MRI 06/01/2015, CT 05/31/2015  FINDINGS: NECK  No hypermetabolic lymph nodes in the neck.  CHEST  No hypermetabolic mediastinal or hilar nodes. No suspicious pulmonary nodules on the CT scan.  ABDOMEN/PELVIS  Small hypermetabolic focus in the dome of the RIGHT hepatic lobe with SUV max equal 3.0 (image 216 of the fused data set) corresponds to the irregular enhancing lesion on comparison MRI. No additional hypermetabolic foci within the liver.  There is mild to moderate metabolic activity associated with the pancreatic parenchymal lesion with SUV max equal 3.3 on image 190 fused data set. There is mild peripheral metabolic activity of the necrotic lymph node inferior to the pancreatic head with SUV max equal 3.7. Additional metastatic lymph node adjacent to the splenic vein and pancreatic lesion measures 14 mm on image 136 with similar mild metabolic activity(SUV max 3.3).  No additional hypermetabolic abdominal pelvic lymph nodes. No abnormal metabolic activity  within the omentum or peritoneal surface.  SKELETON  No focal hypermetabolic activity to suggest skeletal metastasis.  IMPRESSION: 1. Mild to moderate metabolic activity associated with primary pancreatic adenocarcinoma and two adjacent metastatic lymph nodes. 2. Single focus of abnormal metabolic activity within the superior RIGHT hepatic lobe corresponds to enhancing lesion on comparison MRI and is consistent with a hepatic metastasis.   Electronically Signed   By: Suzy Bouchard M.D.   On: 06/20/2015 11:59    ASSESSMENT: Stage IV adenocarcinoma the pancreas.  PLAN:    1. Pancreatic cancer: Patient's imaging and pathology results from [facility] reviewed independently confirming stage IV disease.  PET scan results also reviewed independently and reported as with likely metastatic disease in patient's liver. Patient's most recent CA-19-9 is 801.  Proceed with cycle 1, day 8 of gemcitabine and Abraxane. Patient will receive this regimen on days 1, 8, and 15 with day 22 off. Will plan to reimage after 3 cycles. Return to clinic in 1 week for consideration of cycle 1, day 15. 2. Thrombocytopenia: Mild, proceed with chemotherapy as above. Consider dose reduction in the future.  Patient expressed understanding and was in agreement with this plan. He also understands that He can call clinic at any time with any questions, concerns, or complaints.   Lloyd Huger, MD   07/05/2015 9:42 AM

## 2015-07-05 NOTE — Telephone Encounter (Signed)
Notified patient's wife of Dr. Josefine Class comments. Patient verbalized understanding. Thanks!

## 2015-07-05 NOTE — Progress Notes (Signed)
Patient has a rash on his arms and body that is itchy at times.  His wife brings in some herbal supplements she would like to discuss with Dr. Grayland Ormond before patient takes.

## 2015-07-06 ENCOUNTER — Telehealth: Payer: Self-pay

## 2015-07-06 NOTE — Telephone Encounter (Signed)
Please close encounter if completed

## 2015-07-06 NOTE — Telephone Encounter (Signed)
  Oncology Nurse Navigator Documentation    Navigator Encounter Type: Telephone (07/06/15 1600)                      Time Spent with Patient: 15 (07/06/15 1600)   Attempted to return call regarding names of chemotherapy drugs he is on. No voicemail set up on number left to call

## 2015-07-09 LAB — HM DIABETES EYE EXAM

## 2015-07-16 NOTE — Progress Notes (Signed)
[facility]  [phone]) [phone] Fax:(336) [phone]  ID: Riyan Haile OB: 26-May-1941  MR#: [id number]  NTI#:[id number]  Patient Care Team: Tonia Ghent, MD as PCP - General (Family Medicine) Hessie Knows, MD as Referring Physician (Orthopedic Surgery) Clent Jacks, RN as Registered Nurse  CHIEF COMPLAINT:  Chief Complaint  Patient presents with  . Pancreatic Cancer  . Chemotherapy    INTERVAL HISTORY: Patient returns to clinic today for further evaluation and consideration of cycle 1, day 15 of gemcitabine and Abraxane. He currently feels well and is asymptomatic. He denies any fevers.  He denies any chest pain or shortness of breath. He has occasional nausea, but denies any vomiting, constipation, or diarrhea. He has no urinary complaints. Patient offers no specific complaints today.  REVIEW OF SYSTEMS:   Review of Systems  Constitutional: Positive for malaise/fatigue. Negative for fever and weight loss.  Cardiovascular: Negative.   Gastrointestinal: Positive for nausea. Negative for abdominal pain.  Musculoskeletal: Negative.   Neurological: Positive for weakness.    As per HPI. Otherwise, a complete review of systems is negatve.  PAST MEDICAL HISTORY: Past Medical History  Diagnosis Date  . Hypertension   . DVT of leg (deep venous thrombosis) 2009  . Nephrolithiasis   . Hyperlipidemia   . Allergic rhinitis   . CAD (coronary artery disease)   . Urethral diverticulum   . Varicose vein of leg   . Dupuytren's contracture   . ED (erectile dysfunction)   . History of colonic polyps   . GERD (gastroesophageal reflux disease)   . Diverticulosis of colon   . Myocardial infarction   . DVT (deep venous thrombosis)   . Polio 1948    was hospitalized for 1 year  . Diabetes mellitus, type 2     Patient takes Metformin.  . SCC (squamous cell carcinoma)     scalp 2013 per derm    PAST SURGICAL HISTORY: Past Surgical History  Procedure  Laterality Date  . Appendectomy  1994  . Umbilical hernia repair  03/14/2005  . 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 Replacement total knee Left 2016  . Peripheral vascular catheterization N/A 06/23/2015    Procedure: [name];  Surgeon: Algernon Huxley, MD;  Location: [facility];  Service: Cardiovascular;  Laterality: N/A;    FAMILY HISTORY Family History  Problem Relation Age of Onset  . Diabetes Mother   . Hypertension Mother   . Stroke Mother   . Alzheimer's disease Mother   . Heart attack Father   . Hypertension Father   . Prostate cancer Brother   . Diabetes Brother   . Hypertension Brother   . Diabetes Sister   . Colon cancer Neg Hx   . Hypertension Daughter        ADVANCED DIRECTIVES:    HEALTH MAINTENANCE: Social History  Substance Use Topics  . Smoking status: Former Smoker    Quit date: 10/08/1988  . Smokeless tobacco: Never Used  . Alcohol Use: 0.0 oz/week    0 Standard drinks or equivalent per week     Comment: RARE     Colonoscopy:  PAP:  Bone density:  Lipid panel:  Allergies  Allergen Reactions  . Glimepiride Other (See Comments)    Leg stiffness  . Lipitor [Atorvastatin Calcium]     Tolerates crestor  . Oxycodone Other (See Comments)    Altered mental state  . Tape   .  Latex Rash    Current Outpatient Prescriptions  Medication Sig Dispense Refill  . aspirin EC 81 MG tablet Take 1 tablet (81 mg total) by mouth daily. 90 tablet 3  . carvedilol (COREG) 6.25 MG tablet Take 1 tablet (6.25 mg total) by mouth 2 (two) times daily with a meal. 180 tablet 3  . esomeprazole (NEXIUM) 20 MG capsule Take 20 mg by mouth daily as needed (HEARTBURN).    . Insulin Glargine (LANTUS SOLOSTAR) 100 UNIT/ML Solostar Pen Inject 5 units once a day.  Can add 1 unit per day if AM sugar is >150. 5 pen PRN  . lidocaine-prilocaine (EMLA) cream Apply 1 application topically as needed. Apply to port 1 hour prior to chemotherapy  appointment, cover with plastic wrap. 30 g 2  . lisinopril (PRINIVIL,ZESTRIL) 10 MG tablet Take 1 tablet (10 mg total) by mouth daily. 90 tablet 3  . metFORMIN (GLUCOPHAGE) 1000 MG tablet Take up to 1000mg  in the AM and 500mg  in the PM.  If GI upset, cut back to 1000mg  a day total.    . prochlorperazine (COMPAZINE) 10 MG tablet Take 1 tablet (10 mg total) by mouth every 6 (six) hours as needed for nausea or vomiting. 30 tablet 2  . rosuvastatin (CRESTOR) 20 MG tablet Take 0.5 tablets (10 mg total) by mouth daily.    . sitaGLIPtin (JANUVIA) 100 MG tablet Take 1 tablet (100 mg total) by mouth daily. 30 tablet 12   No current facility-administered medications for this visit.    OBJECTIVE: There were no vitals filed for this visit.   There is no weight on file to calculate BMI.    ECOG FS:1 - Symptomatic but completely ambulatory  General: Well-developed, well-nourished, no acute distress. Eyes: Pink conjunctiva, anicteric sclera. Lungs: Clear to auscultation bilaterally. Heart: Regular rate and rhythm. No rubs, murmurs, or gallops. Abdomen: Soft, nontender, nondistended. No organomegaly noted, normoactive bowel sounds. Musculoskeletal: No edema, cyanosis, or clubbing. Neuro: Alert, answering all questions appropriately. Cranial nerves grossly intact. Skin: No rashes or petechiae noted. Psych: Normal affect.    LAB RESULTS:  Lab Results  Component Value Date   NA 139 07/05/2015   K 3.8 07/05/2015   CL 105 07/05/2015   CO2 28 07/05/2015   GLUCOSE 228* 07/05/2015   BUN 13 07/05/2015   CREATININE 0.73 07/05/2015   CALCIUM 8.3* 07/05/2015   PROT 6.2* 07/05/2015   ALBUMIN 3.4* 07/05/2015   AST 14* 07/05/2015   ALT 13* 07/05/2015   ALKPHOS 52 07/05/2015   BILITOT 0.4 07/05/2015   GFRNONAA >60 07/05/2015   GFRAA >60 07/05/2015    Lab Results  Component Value Date   WBC 3.8 07/05/2015   NEUTROABS 2.4 07/05/2015   HGB 12.8* 07/05/2015   HCT 39.4* 07/05/2015   MCV 79.0*  07/05/2015   PLT 114* 07/05/2015     STUDIES: Nm Pet Image Initial (pi) Skull Base To Thigh  06/20/2015   CLINICAL DATA:  Initial treatment strategy for pancreatic adenocarcinoma.  EXAM: NUCLEAR MEDICINE PET SKULL BASE TO THIGH  TECHNIQUE: 12.4 mCi F-18 FDG was injected intravenously. Full-ring PET imaging was performed from the skull base to thigh after the radiotracer. CT data was obtained and used for attenuation correction and anatomic localization.  FASTING BLOOD GLUCOSE:  Value: 157 mg/dl  COMPARISON:  MRI 06/01/2015, CT 05/31/2015  FINDINGS: NECK  No hypermetabolic lymph nodes in the neck.  CHEST  No hypermetabolic mediastinal or hilar nodes. No suspicious pulmonary nodules on the CT scan.  ABDOMEN/PELVIS  Small hypermetabolic focus in the dome of the RIGHT hepatic lobe with SUV max equal 3.0 (image 216 of the fused data set) corresponds to the irregular enhancing lesion on comparison MRI. No additional hypermetabolic foci within the liver.  There is mild to moderate metabolic activity associated with the pancreatic parenchymal lesion with SUV max equal 3.3 on image 190 fused data set. There is mild peripheral metabolic activity of the necrotic lymph node inferior to the pancreatic head with SUV max equal 3.7. Additional metastatic lymph node adjacent to the splenic vein and pancreatic lesion measures 14 mm on image 136 with similar mild metabolic activity(SUV max 3.3).  No additional hypermetabolic abdominal pelvic lymph nodes. No abnormal metabolic activity within the omentum or peritoneal surface.  SKELETON  No focal hypermetabolic activity to suggest skeletal metastasis.  IMPRESSION: 1. Mild to moderate metabolic activity associated with primary pancreatic adenocarcinoma and two adjacent metastatic lymph nodes. 2. Single focus of abnormal metabolic activity within the superior RIGHT hepatic lobe corresponds to enhancing lesion on comparison MRI and is consistent with a hepatic metastasis.    Electronically Signed   By: Suzy Bouchard M.D.   On: 06/20/2015 11:59    ASSESSMENT: Stage IV adenocarcinoma the pancreas.  PLAN:    1. Pancreatic cancer: Patient's imaging and pathology results from [facility] reviewed independently confirming stage IV disease.  PET scan results also reviewed independently and reported as with likely metastatic disease in patient's liver. Patient's most recent CA-19-9 is 801.  Proceed with cycle 1, day 15 of gemcitabine and Abraxane. Patient will receive this regimen on days 1, 8, and 15 with day 22 off. Will plan to reimage after 3 cycles. Return to clinic in 2 weeks for consideration of cycle 2, day 1. 2. Thrombocytopenia: Mild, proceed with chemotherapy as above. Consider dose reduction in the future.  Patient expressed understanding and was in agreement with this plan. He also understands that He can call clinic at any time with any questions, concerns, or complaints.   Lloyd Huger, MD   07/16/2015 3:41 PM

## 2015-07-19 ENCOUNTER — Inpatient Hospital Stay (HOSPITAL_BASED_OUTPATIENT_CLINIC_OR_DEPARTMENT_OTHER): Payer: PPO | Admitting: Oncology

## 2015-07-19 ENCOUNTER — Inpatient Hospital Stay: Payer: PPO

## 2015-07-19 ENCOUNTER — Inpatient Hospital Stay: Payer: PPO | Attending: Oncology

## 2015-07-19 VITALS — BP 145/71 | HR 65 | Temp 97.0°F | Resp 18

## 2015-07-19 DIAGNOSIS — Z794 Long term (current) use of insulin: Secondary | ICD-10-CM

## 2015-07-19 DIAGNOSIS — C259 Malignant neoplasm of pancreas, unspecified: Secondary | ICD-10-CM

## 2015-07-19 DIAGNOSIS — I2510 Atherosclerotic heart disease of native coronary artery without angina pectoris: Secondary | ICD-10-CM

## 2015-07-19 DIAGNOSIS — K219 Gastro-esophageal reflux disease without esophagitis: Secondary | ICD-10-CM | POA: Diagnosis not present

## 2015-07-19 DIAGNOSIS — E119 Type 2 diabetes mellitus without complications: Secondary | ICD-10-CM

## 2015-07-19 DIAGNOSIS — C787 Secondary malignant neoplasm of liver and intrahepatic bile duct: Secondary | ICD-10-CM | POA: Insufficient documentation

## 2015-07-19 DIAGNOSIS — D649 Anemia, unspecified: Secondary | ICD-10-CM | POA: Diagnosis not present

## 2015-07-19 DIAGNOSIS — I10 Essential (primary) hypertension: Secondary | ICD-10-CM | POA: Diagnosis not present

## 2015-07-19 DIAGNOSIS — R110 Nausea: Secondary | ICD-10-CM

## 2015-07-19 DIAGNOSIS — Z79899 Other long term (current) drug therapy: Secondary | ICD-10-CM

## 2015-07-19 DIAGNOSIS — R5381 Other malaise: Secondary | ICD-10-CM | POA: Insufficient documentation

## 2015-07-19 DIAGNOSIS — Z86010 Personal history of colonic polyps: Secondary | ICD-10-CM | POA: Diagnosis not present

## 2015-07-19 DIAGNOSIS — Z7984 Long term (current) use of oral hypoglycemic drugs: Secondary | ICD-10-CM | POA: Insufficient documentation

## 2015-07-19 DIAGNOSIS — E1165 Type 2 diabetes mellitus with hyperglycemia: Secondary | ICD-10-CM | POA: Insufficient documentation

## 2015-07-19 DIAGNOSIS — D6959 Other secondary thrombocytopenia: Secondary | ICD-10-CM | POA: Diagnosis not present

## 2015-07-19 DIAGNOSIS — Z87891 Personal history of nicotine dependence: Secondary | ICD-10-CM

## 2015-07-19 DIAGNOSIS — Z7982 Long term (current) use of aspirin: Secondary | ICD-10-CM | POA: Diagnosis not present

## 2015-07-19 DIAGNOSIS — T451X5S Adverse effect of antineoplastic and immunosuppressive drugs, sequela: Secondary | ICD-10-CM | POA: Insufficient documentation

## 2015-07-19 DIAGNOSIS — Z86718 Personal history of other venous thrombosis and embolism: Secondary | ICD-10-CM | POA: Insufficient documentation

## 2015-07-19 DIAGNOSIS — E785 Hyperlipidemia, unspecified: Secondary | ICD-10-CM

## 2015-07-19 DIAGNOSIS — R531 Weakness: Secondary | ICD-10-CM | POA: Insufficient documentation

## 2015-07-19 DIAGNOSIS — Z5111 Encounter for antineoplastic chemotherapy: Secondary | ICD-10-CM | POA: Insufficient documentation

## 2015-07-19 DIAGNOSIS — R5383 Other fatigue: Secondary | ICD-10-CM | POA: Insufficient documentation

## 2015-07-19 LAB — CBC WITH DIFFERENTIAL/PLATELET
BASOS ABS: 0 10*3/uL (ref 0–0.1)
BASOS PCT: 1 %
Eosinophils Absolute: 0.5 10*3/uL (ref 0–0.7)
Eosinophils Relative: 7 %
HEMATOCRIT: 38.4 % — AB (ref 40.0–52.0)
Hemoglobin: 12.5 g/dL — ABNORMAL LOW (ref 13.0–18.0)
LYMPHS PCT: 16 %
Lymphs Abs: 1.1 10*3/uL (ref 1.0–3.6)
MCH: 25.7 pg — ABNORMAL LOW (ref 26.0–34.0)
MCHC: 32.5 g/dL (ref 32.0–36.0)
MCV: 78.9 fL — ABNORMAL LOW (ref 80.0–100.0)
MONO ABS: 0.7 10*3/uL (ref 0.2–1.0)
Monocytes Relative: 9 %
NEUTROS ABS: 4.7 10*3/uL (ref 1.4–6.5)
Neutrophils Relative %: 67 %
PLATELETS: 181 10*3/uL (ref 150–440)
RBC: 4.87 MIL/uL (ref 4.40–5.90)
RDW: 14.9 % — AB (ref 11.5–14.5)
WBC: 7.1 10*3/uL (ref 3.8–10.6)

## 2015-07-19 LAB — COMPREHENSIVE METABOLIC PANEL
ALBUMIN: 3.5 g/dL (ref 3.5–5.0)
ALT: 11 U/L — AB (ref 17–63)
AST: 16 U/L (ref 15–41)
Alkaline Phosphatase: 56 U/L (ref 38–126)
Anion gap: 6 (ref 5–15)
BILIRUBIN TOTAL: 0.6 mg/dL (ref 0.3–1.2)
BUN: 10 mg/dL (ref 6–20)
CHLORIDE: 105 mmol/L (ref 101–111)
CO2: 28 mmol/L (ref 22–32)
CREATININE: 0.69 mg/dL (ref 0.61–1.24)
Calcium: 8.3 mg/dL — ABNORMAL LOW (ref 8.9–10.3)
GFR calc Af Amer: >60 mL/min (ref >60)
GFR calc non Af Amer: >60 mL/min (ref >60)
GLUCOSE: 226 mg/dL — AB (ref 65–99)
POTASSIUM: 3.3 mmol/L — AB (ref 3.5–5.1)
Sodium: 139 mmol/L (ref 135–145)
Total Protein: 6.2 g/dL — ABNORMAL LOW (ref 6.5–8.1)

## 2015-07-19 MED ORDER — SODIUM CHLORIDE 0.9 % IV SOLN
2000.0000 mg | Freq: Once | INTRAVENOUS | Status: AC
  Administered 2015-07-19: 2000 mg via INTRAVENOUS
  Filled 2015-07-19: qty 52.6

## 2015-07-19 MED ORDER — PACLITAXEL PROTEIN-BOUND CHEMO INJECTION 100 MG
125.0000 mg/m2 | Freq: Once | INTRAVENOUS | Status: AC
  Administered 2015-07-19: 250 mg via INTRAVENOUS
  Filled 2015-07-19: qty 50

## 2015-07-19 MED ORDER — SODIUM CHLORIDE 0.9 % IJ SOLN
10.0000 mL | INTRAMUSCULAR | Status: DC | PRN
  Administered 2015-07-19: 10 mL
  Filled 2015-07-19: qty 10

## 2015-07-19 MED ORDER — HEPARIN SOD (PORK) LOCK FLUSH 100 UNIT/ML IV SOLN
500.0000 [IU] | Freq: Once | INTRAVENOUS | Status: DC | PRN
  Filled 2015-07-19: qty 5

## 2015-07-19 MED ORDER — SODIUM CHLORIDE 0.9 % IV SOLN
Freq: Once | INTRAVENOUS | Status: AC
  Administered 2015-07-19: 11:00:00 via INTRAVENOUS
  Filled 2015-07-19: qty 1000

## 2015-07-19 MED ORDER — SODIUM CHLORIDE 0.9 % IV SOLN: 1000.0000 mg/m2 | Freq: Once | INTRAVENOUS | Status: DC

## 2015-07-19 MED ORDER — SODIUM CHLORIDE 0.9 % IV SOLN
Freq: Once | INTRAVENOUS | Status: AC
  Administered 2015-07-19: 11:00:00 via INTRAVENOUS
  Filled 2015-07-19: qty 4

## 2015-07-19 NOTE — Progress Notes (Signed)
Patient had 2 episodes of clear nasal drainage.

## 2015-07-20 LAB — CANCER ANTIGEN 19-9: CA 19-9: 397 U/mL — ABNORMAL HIGH (ref 0–35)

## 2015-07-25 ENCOUNTER — Telehealth: Payer: Self-pay | Admitting: *Deleted

## 2015-07-25 NOTE — Telephone Encounter (Signed)
Spoke w/ pt's wife. She reports that pt has pancreatic cancer.  She thought that pt had lost more wt than he had.  Pt weighed 169 at last ov on 06/01/15, weighed 171 on 07/19/15. She will keep [place] posted on pt's progress and will call back w/ any questions or concerns.

## 2015-07-25 NOTE — Telephone Encounter (Signed)
Pt wife calling stating pt has dropped weight. His weight today is 169 with clothes on and 163 without.  She was told to call us just in case we need to change some medications around.  Please advise.

## 2015-07-26 ENCOUNTER — Inpatient Hospital Stay: Payer: PPO

## 2015-07-26 ENCOUNTER — Inpatient Hospital Stay (HOSPITAL_BASED_OUTPATIENT_CLINIC_OR_DEPARTMENT_OTHER): Payer: PPO | Admitting: Oncology

## 2015-07-26 VITALS — BP 144/77 | HR 72 | Temp 97.2°F | Resp 16 | Wt 169.3 lb

## 2015-07-26 DIAGNOSIS — R110 Nausea: Secondary | ICD-10-CM

## 2015-07-26 DIAGNOSIS — R5383 Other fatigue: Secondary | ICD-10-CM

## 2015-07-26 DIAGNOSIS — R531 Weakness: Secondary | ICD-10-CM

## 2015-07-26 DIAGNOSIS — E119 Type 2 diabetes mellitus without complications: Secondary | ICD-10-CM

## 2015-07-26 DIAGNOSIS — I2510 Atherosclerotic heart disease of native coronary artery without angina pectoris: Secondary | ICD-10-CM

## 2015-07-26 DIAGNOSIS — C787 Secondary malignant neoplasm of liver and intrahepatic bile duct: Secondary | ICD-10-CM

## 2015-07-26 DIAGNOSIS — C259 Malignant neoplasm of pancreas, unspecified: Secondary | ICD-10-CM

## 2015-07-26 DIAGNOSIS — D649 Anemia, unspecified: Secondary | ICD-10-CM

## 2015-07-26 DIAGNOSIS — I10 Essential (primary) hypertension: Secondary | ICD-10-CM

## 2015-07-26 DIAGNOSIS — Z87891 Personal history of nicotine dependence: Secondary | ICD-10-CM

## 2015-07-26 DIAGNOSIS — E785 Hyperlipidemia, unspecified: Secondary | ICD-10-CM

## 2015-07-26 DIAGNOSIS — R5381 Other malaise: Secondary | ICD-10-CM

## 2015-07-26 DIAGNOSIS — Z5111 Encounter for antineoplastic chemotherapy: Secondary | ICD-10-CM | POA: Diagnosis not present

## 2015-07-26 LAB — CBC WITH DIFFERENTIAL/PLATELET
BASOS PCT: 1 %
Basophils Absolute: 0 10*3/uL (ref 0–0.1)
EOS ABS: 0.7 10*3/uL (ref 0–0.7)
Eosinophils Relative: 10 %
HEMATOCRIT: 37.8 % — AB (ref 40.0–52.0)
Hemoglobin: 12.3 g/dL — ABNORMAL LOW (ref 13.0–18.0)
Lymphocytes Relative: 18 %
Lymphs Abs: 1.2 10*3/uL (ref 1.0–3.6)
MCH: 25.7 pg — ABNORMAL LOW (ref 26.0–34.0)
MCHC: 32.5 g/dL (ref 32.0–36.0)
MCV: 79 fL — ABNORMAL LOW (ref 80.0–100.0)
MONO ABS: 0.5 10*3/uL (ref 0.2–1.0)
MONOS PCT: 7 %
Neutro Abs: 4.4 10*3/uL (ref 1.4–6.5)
Neutrophils Relative %: 64 %
Platelets: 155 10*3/uL (ref 150–440)
RBC: 4.78 MIL/uL (ref 4.40–5.90)
RDW: 15 % — AB (ref 11.5–14.5)
WBC: 6.7 10*3/uL (ref 3.8–10.6)

## 2015-07-26 LAB — COMPREHENSIVE METABOLIC PANEL
ALBUMIN: 3.4 g/dL — AB (ref 3.5–5.0)
ALK PHOS: 62 U/L (ref 38–126)
ALT: 13 U/L — ABNORMAL LOW (ref 17–63)
AST: 21 U/L (ref 15–41)
Anion gap: 6 (ref 5–15)
BILIRUBIN TOTAL: 0.3 mg/dL (ref 0.3–1.2)
BUN: 12 mg/dL (ref 6–20)
CO2: 28 mmol/L (ref 22–32)
Calcium: 8.3 mg/dL — ABNORMAL LOW (ref 8.9–10.3)
Chloride: 105 mmol/L (ref 101–111)
Creatinine, Ser: 0.63 mg/dL (ref 0.61–1.24)
GFR calc Af Amer: >60 mL/min (ref >60)
GFR calc non Af Amer: >60 mL/min (ref >60)
GLUCOSE: 249 mg/dL — AB (ref 65–99)
Potassium: 3.4 mmol/L — ABNORMAL LOW (ref 3.5–5.1)
Sodium: 139 mmol/L (ref 135–145)
TOTAL PROTEIN: 6.4 g/dL — AB (ref 6.5–8.1)

## 2015-07-26 MED ORDER — PACLITAXEL PROTEIN-BOUND CHEMO INJECTION 100 MG
125.0000 mg/m2 | Freq: Once | INTRAVENOUS | Status: AC
  Administered 2015-07-26: 250 mg via INTRAVENOUS
  Filled 2015-07-26: qty 50

## 2015-07-26 MED ORDER — SODIUM CHLORIDE 0.9 % IV SOLN
Freq: Once | INTRAVENOUS | Status: AC
  Administered 2015-07-26: 11:00:00 via INTRAVENOUS
  Filled 2015-07-26: qty 1000

## 2015-07-26 MED ORDER — SODIUM CHLORIDE 0.9 % IV SOLN
Freq: Once | INTRAVENOUS | Status: AC
  Administered 2015-07-26: 11:00:00 via INTRAVENOUS
  Filled 2015-07-26: qty 4

## 2015-07-26 MED ORDER — SODIUM CHLORIDE 0.9 % IV SOLN
2000.0000 mg | Freq: Once | INTRAVENOUS | Status: AC
  Administered 2015-07-26: 2000 mg via INTRAVENOUS
  Filled 2015-07-26: qty 52.6

## 2015-07-26 MED ORDER — SODIUM CHLORIDE 0.9 % IJ SOLN
10.0000 mL | INTRAMUSCULAR | Status: DC | PRN
  Administered 2015-07-26: 10 mL
  Filled 2015-07-26: qty 10

## 2015-07-26 MED ORDER — HEPARIN SOD (PORK) LOCK FLUSH 100 UNIT/ML IV SOLN
500.0000 [IU] | Freq: Once | INTRAVENOUS | Status: AC | PRN
  Administered 2015-07-26: 500 [IU]
  Filled 2015-07-26: qty 5

## 2015-07-26 NOTE — Progress Notes (Signed)
Patient has a rash on his arms and trunk that is itchy and burns.

## 2015-07-29 ENCOUNTER — Telehealth: Payer: Self-pay | Admitting: Oncology

## 2015-07-29 ENCOUNTER — Telehealth: Payer: Self-pay | Admitting: *Deleted

## 2015-07-29 NOTE — Telephone Encounter (Signed)
Has yellow drainage from eye which is scratchy and a little red. States that they have some tobramycin opthalmic ointment on hand, but not sure if it is expired and does not remember when they got it from eye doctor. Do you want to order anything for him? Last chemo tx was 10/18

## 2015-07-29 NOTE — Telephone Encounter (Signed)
She states will try to get him in with [facility], if not she will take him to Urgent care

## 2015-07-29 NOTE — Telephone Encounter (Signed)
i think he should go to urgent care to be evaluated. I am uncomfortable treating an eye infection without an evalution

## 2015-08-02 ENCOUNTER — Inpatient Hospital Stay (HOSPITAL_BASED_OUTPATIENT_CLINIC_OR_DEPARTMENT_OTHER): Payer: PPO | Admitting: Oncology

## 2015-08-02 ENCOUNTER — Inpatient Hospital Stay: Payer: PPO

## 2015-08-02 VITALS — BP 147/71 | HR 76 | Temp 96.1°F | Wt 164.2 lb

## 2015-08-02 DIAGNOSIS — C259 Malignant neoplasm of pancreas, unspecified: Secondary | ICD-10-CM

## 2015-08-02 DIAGNOSIS — E785 Hyperlipidemia, unspecified: Secondary | ICD-10-CM

## 2015-08-02 DIAGNOSIS — T451X5S Adverse effect of antineoplastic and immunosuppressive drugs, sequela: Secondary | ICD-10-CM

## 2015-08-02 DIAGNOSIS — D649 Anemia, unspecified: Secondary | ICD-10-CM | POA: Diagnosis not present

## 2015-08-02 DIAGNOSIS — C787 Secondary malignant neoplasm of liver and intrahepatic bile duct: Secondary | ICD-10-CM | POA: Diagnosis not present

## 2015-08-02 DIAGNOSIS — Z87891 Personal history of nicotine dependence: Secondary | ICD-10-CM

## 2015-08-02 DIAGNOSIS — R110 Nausea: Secondary | ICD-10-CM

## 2015-08-02 DIAGNOSIS — I2510 Atherosclerotic heart disease of native coronary artery without angina pectoris: Secondary | ICD-10-CM

## 2015-08-02 DIAGNOSIS — E1165 Type 2 diabetes mellitus with hyperglycemia: Secondary | ICD-10-CM

## 2015-08-02 DIAGNOSIS — Z5111 Encounter for antineoplastic chemotherapy: Secondary | ICD-10-CM | POA: Diagnosis not present

## 2015-08-02 DIAGNOSIS — D6959 Other secondary thrombocytopenia: Secondary | ICD-10-CM

## 2015-08-02 DIAGNOSIS — I10 Essential (primary) hypertension: Secondary | ICD-10-CM

## 2015-08-02 DIAGNOSIS — Z794 Long term (current) use of insulin: Secondary | ICD-10-CM

## 2015-08-02 LAB — CBC WITH DIFFERENTIAL/PLATELET
BASOS ABS: 0 10*3/uL (ref 0–0.1)
Basophils Relative: 1 %
EOS ABS: 0.4 10*3/uL (ref 0–0.7)
EOS PCT: 7 %
HCT: 38.1 % — ABNORMAL LOW (ref 40.0–52.0)
Hemoglobin: 12.4 g/dL — ABNORMAL LOW (ref 13.0–18.0)
Lymphocytes Relative: 21 %
Lymphs Abs: 1.1 10*3/uL (ref 1.0–3.6)
MCH: 25.7 pg — AB (ref 26.0–34.0)
MCHC: 32.5 g/dL (ref 32.0–36.0)
MCV: 79 fL — ABNORMAL LOW (ref 80.0–100.0)
MONO ABS: 0.4 10*3/uL (ref 0.2–1.0)
Monocytes Relative: 8 %
Neutro Abs: 3.3 10*3/uL (ref 1.4–6.5)
Neutrophils Relative %: 63 %
PLATELETS: 104 10*3/uL — AB (ref 150–440)
RBC: 4.82 MIL/uL (ref 4.40–5.90)
RDW: 15.7 % — AB (ref 11.5–14.5)
WBC: 5.2 10*3/uL (ref 4.0–10.5)

## 2015-08-02 LAB — COMPREHENSIVE METABOLIC PANEL
ALT: 16 U/L — AB (ref 17–63)
AST: 20 U/L (ref 15–41)
Albumin: 3.5 g/dL (ref 3.5–5.0)
Alkaline Phosphatase: 63 U/L (ref 38–126)
Anion gap: 6 (ref 5–15)
BUN: 14 mg/dL (ref 6–20)
CHLORIDE: 106 mmol/L (ref 101–111)
CO2: 25 mmol/L (ref 22–32)
CREATININE: 0.73 mg/dL (ref 0.61–1.24)
Calcium: 8.3 mg/dL — ABNORMAL LOW (ref 8.9–10.3)
Glucose, Bld: 260 mg/dL — ABNORMAL HIGH (ref 65–99)
POTASSIUM: 3.9 mmol/L (ref 3.5–5.1)
SODIUM: 137 mmol/L (ref 135–145)
Total Bilirubin: 0.4 mg/dL (ref 0.3–1.2)
Total Protein: 6.3 g/dL — ABNORMAL LOW (ref 6.5–8.1)

## 2015-08-02 MED ORDER — SODIUM CHLORIDE 0.9 % IV SOLN
Freq: Once | INTRAVENOUS | Status: AC
  Administered 2015-08-02: 10:00:00 via INTRAVENOUS
  Filled 2015-08-02: qty 1000

## 2015-08-02 MED ORDER — SODIUM CHLORIDE 0.9 % IJ SOLN
10.0000 mL | INTRAMUSCULAR | Status: DC | PRN
  Administered 2015-08-02 (×2): 10 mL
  Filled 2015-08-02: qty 10

## 2015-08-02 MED ORDER — HEPARIN SOD (PORK) LOCK FLUSH 100 UNIT/ML IV SOLN
500.0000 [IU] | Freq: Once | INTRAVENOUS | Status: AC | PRN
Start: 2015-08-02 — End: 2015-08-02
  Administered 2015-08-02: 500 [IU]
  Filled 2015-08-02: qty 5

## 2015-08-02 MED ORDER — PACLITAXEL PROTEIN-BOUND CHEMO INJECTION 100 MG
125.0000 mg/m2 | Freq: Once | INTRAVENOUS | Status: AC
  Administered 2015-08-02: 250 mg via INTRAVENOUS
  Filled 2015-08-02: qty 50

## 2015-08-02 MED ORDER — SODIUM CHLORIDE 0.9 % IV SOLN
Freq: Once | INTRAVENOUS | Status: AC
  Administered 2015-08-02: 10:00:00 via INTRAVENOUS
  Filled 2015-08-02: qty 4

## 2015-08-02 MED ORDER — SODIUM CHLORIDE 0.9 % IV SOLN
2000.0000 mg | Freq: Once | INTRAVENOUS | Status: AC
  Administered 2015-08-02: 2000 mg via INTRAVENOUS
  Filled 2015-08-02: qty 52.6

## 2015-08-02 NOTE — Progress Notes (Signed)
[place]  [phone]) [phone] Fax:(336) [phone]  ID: Dainel Arcidiacono OB: 1941/02/24  MR#: [id number]  TLX#:[id number]  Patient Care Team: Tonia Ghent, MD as PCP - General (Family Medicine) Hessie Knows, MD as Referring Physician (Orthopedic Surgery) Clent Jacks, RN as Registered Nurse  CHIEF COMPLAINT:  Chief Complaint  Patient presents with  . Pancreatic Cancer    INTERVAL HISTORY: Patient returns to clinic today for further evaluation and consideration of cycle 2, day 15 of gemcitabine and Abraxane. His eye infection has resolved. He currently feels well and is asymptomatic. He denies any fevers.  He denies any chest pain or shortness of breath. He has occasional nausea, but denies any vomiting, constipation, or diarrhea. He has no urinary complaints. Patient offers no specific complaints today.  REVIEW OF SYSTEMS:   Review of Systems  Constitutional: Negative for fever, weight loss and malaise/fatigue.  Eyes: Negative for pain, discharge and redness.  Cardiovascular: Negative.   Gastrointestinal: Positive for nausea. Negative for abdominal pain.  Musculoskeletal: Negative.   Neurological: Negative for weakness.  Endo/Heme/Allergies: Does not bruise/bleed easily.    As per HPI. Otherwise, a complete review of systems is negatve.  PAST MEDICAL HISTORY: Past Medical History  Diagnosis Date  . Hypertension   . DVT of leg (deep venous thrombosis) 2009  . Nephrolithiasis   . Hyperlipidemia   . Allergic rhinitis   . CAD (coronary artery disease)   . Urethral diverticulum   . Varicose vein of leg   . Dupuytren's contracture   . ED (erectile dysfunction)   . History of colonic polyps   . GERD (gastroesophageal reflux disease)   . Diverticulosis of colon   . Myocardial infarction   . DVT (deep venous thrombosis)   . Polio 1948    was hospitalized for 1 year  . Diabetes mellitus, type 2     Patient takes Metformin.  . SCC (squamous  cell carcinoma)     scalp 2013 per derm    PAST SURGICAL HISTORY: Past Surgical History  Procedure Laterality Date  . Appendectomy  1994  . Umbilical hernia repair  03/14/2005  . 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 Replacement total knee Left 2016  . Peripheral vascular catheterization N/A 06/23/2015    Procedure: [name];  Surgeon: Algernon Huxley, MD;  Location: [place] [facility];  Service: Cardiovascular;  Laterality: N/A;    FAMILY HISTORY Family History  Problem Relation Age of Onset  . Diabetes Mother   . Hypertension Mother   . Stroke Mother   . Alzheimer's disease Mother   . Heart attack Father   . Hypertension Father   . Prostate cancer Brother   . Diabetes Brother   . Hypertension Brother   . Diabetes Sister   . Colon cancer Neg Hx   . Hypertension Daughter        ADVANCED DIRECTIVES:    HEALTH MAINTENANCE: Social History  Substance Use Topics  . Smoking status: Former Smoker    Quit date: 10/08/1988  . Smokeless tobacco: Never Used  . Alcohol Use: 0.0 oz/week    0 Standard drinks or equivalent per week     Comment: RARE     Colonoscopy:  PAP:  Bone density:  Lipid panel:  Allergies  Allergen Reactions  . Glimepiride Other (See Comments)    Leg stiffness  . Lipitor [Atorvastatin Calcium]     Tolerates crestor  . Oxycodone Other (  See Comments)    Altered mental state  . Tape   . Latex Rash    Current Outpatient Prescriptions  Medication Sig Dispense Refill  . aspirin EC 81 MG tablet Take 1 tablet (81 mg total) by mouth daily. 90 tablet 3  . carvedilol (COREG) 6.25 MG tablet Take 1 tablet (6.25 mg total) by mouth 2 (two) times daily with a meal. 180 tablet 3  . esomeprazole (NEXIUM) 20 MG capsule Take 20 mg by mouth daily as needed (HEARTBURN).    . Insulin Glargine (LANTUS SOLOSTAR) 100 UNIT/ML Solostar Pen Inject 5 units once a day.  Can add 1 unit per day if AM sugar is >150. 5 pen PRN  . lidocaine-prilocaine  (EMLA) cream Apply 1 application topically as needed. Apply to port 1 hour prior to chemotherapy appointment, cover with plastic wrap. 30 g 2  . lisinopril (PRINIVIL,ZESTRIL) 10 MG tablet Take 1 tablet (10 mg total) by mouth daily. 90 tablet 3  . metFORMIN (GLUCOPHAGE) 1000 MG tablet Take up to 1000mg  in the AM and 500mg  in the PM.  If GI upset, cut back to 1000mg  a day total.    . prochlorperazine (COMPAZINE) 10 MG tablet Take 1 tablet (10 mg total) by mouth every 6 (six) hours as needed for nausea or vomiting. 30 tablet 2  . rosuvastatin (CRESTOR) 20 MG tablet Take 0.5 tablets (10 mg total) by mouth daily.    . sitaGLIPtin (JANUVIA) 100 MG tablet Take 1 tablet (100 mg total) by mouth daily. 30 tablet 12  . ZIRGAN 0.15 % GEL APPLY 0.25 INCH RIBBON IN RIGHT EYE FIVE TIMES DAILY  1   No current facility-administered medications for this visit.   Facility-Administered Medications Ordered in Other Visits  Medication Dose Route Frequency Provider Last Rate Last Dose  . heparin lock flush 100 unit/mL  500 Units Intracatheter Once PRN Lloyd Huger, MD      . sodium chloride 0.9 % injection 10 mL  10 mL Intracatheter PRN Lloyd Huger, MD   10 mL at 08/02/15 0852    OBJECTIVE: Filed Vitals:   08/02/15 0937  BP: 147/71  Pulse: 76  Temp: 96.1 F (35.6 C)     Body mass index is 25.72 kg/(m^2).    ECOG FS:1 - Symptomatic but completely ambulatory  General: Well-developed, well-nourished, no acute distress. Eyes: Pink conjunctiva, anicteric sclera. Lungs: Clear to auscultation bilaterally. Heart: Regular rate and rhythm. No rubs, murmurs, or gallops. Abdomen: Soft, nontender, nondistended. No organomegaly noted, normoactive bowel sounds. Musculoskeletal: No edema, cyanosis, or clubbing. Neuro: Alert, answering all questions appropriately. Cranial nerves grossly intact. Skin: No rashes or petechiae noted. Psych: Normal affect.    LAB RESULTS:  Lab Results  Component Value Date     NA 137 08/02/2015   K 3.9 08/02/2015   CL 106 08/02/2015   CO2 25 08/02/2015   GLUCOSE 260* 08/02/2015   BUN 14 08/02/2015   CREATININE 0.73 08/02/2015   CALCIUM 8.3* 08/02/2015   PROT 6.3* 08/02/2015   ALBUMIN 3.5 08/02/2015   AST 20 08/02/2015   ALT 16* 08/02/2015   ALKPHOS 63 08/02/2015   BILITOT 0.4 08/02/2015   GFRNONAA >60 08/02/2015   GFRAA >60 08/02/2015    Lab Results  Component Value Date   WBC 5.2 08/02/2015   NEUTROABS 3.3 08/02/2015   HGB 12.4* 08/02/2015   HCT 38.1* 08/02/2015   MCV 79.0* 08/02/2015   PLT 104* 08/02/2015     STUDIES: No results found.  ASSESSMENT: Stage IV adenocarcinoma the pancreas.  PLAN:    1. Pancreatic cancer: Patient's imaging and pathology results from [facility] reviewed independently confirming stage IV disease.  PET scan results also reviewed independently and reported as with likely metastatic disease in patient's liver. Patient's most recent CA-19-9 is 397.  Proceed with cycle 2, day 15 of gemcitabine and Abraxane. Patient will receive this regimen on days 1, 8, and 15 with day 22 off. Will plan to reimage after 3 cycles. Return to clinic in 2 weeks for consideration of cycle 3, day 1. 2. Thrombocytopenia: Secondary to chemotherapy.  Monitor. Proceed with treatment as above. 3. Anemia: Mild, monitor. 4. Hyperglycemia:  Continue diabetic medications as prescribed.  Patient expressed understanding and was in agreement with this plan. He also understands that He can call clinic at any time with any questions, concerns, or complaints.   Lloyd Huger, MD   08/02/2015 9:55 AM

## 2015-08-02 NOTE — Progress Notes (Signed)
[place]  [phone]) [phone] Fax:(336) [phone]  ID: Ronnie Johnson OB: 05/12/1941  MR#: [id number]  VOZ#:[id number]  Patient Care Team: Tonia Ghent, MD as PCP - General (Family Medicine) Hessie Knows, MD as Referring Physician (Orthopedic Surgery) Clent Jacks, RN as Registered Nurse  CHIEF COMPLAINT:  Chief Complaint  Patient presents with  . Pancreatic Cancer    INTERVAL HISTORY: Patient returns to clinic today for further evaluation and consideration of cycle 2, day 8 of gemcitabine and Abraxane. He currently feels well and is asymptomatic. He denies any fevers.  He denies any chest pain or shortness of breath. He has occasional nausea, but denies any vomiting, constipation, or diarrhea. He has no urinary complaints. Patient offers no specific complaints today.  REVIEW OF SYSTEMS:   Review of Systems  Constitutional: Positive for malaise/fatigue. Negative for fever and weight loss.  Cardiovascular: Negative.   Gastrointestinal: Positive for nausea. Negative for abdominal pain.  Musculoskeletal: Negative.   Neurological: Positive for weakness.  Endo/Heme/Allergies: Does not bruise/bleed easily.    As per HPI. Otherwise, a complete review of systems is negatve.  PAST MEDICAL HISTORY: Past Medical History  Diagnosis Date  . Hypertension   . DVT of leg (deep venous thrombosis) 2009  . Nephrolithiasis   . Hyperlipidemia   . Allergic rhinitis   . CAD (coronary artery disease)   . Urethral diverticulum   . Varicose vein of leg   . Dupuytren's contracture   . ED (erectile dysfunction)   . History of colonic polyps   . GERD (gastroesophageal reflux disease)   . Diverticulosis of colon   . Myocardial infarction   . DVT (deep venous thrombosis)   . Polio 1948    was hospitalized for 1 year  . Diabetes mellitus, type 2     Patient takes Metformin.  . SCC (squamous cell carcinoma)     scalp 2013 per derm    PAST SURGICAL  HISTORY: Past Surgical History  Procedure Laterality Date  . Appendectomy  1994  . Umbilical hernia repair  03/14/2005  . 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 Replacement total knee Left 2016  . Peripheral vascular catheterization N/A 06/23/2015    Procedure: [name];  Surgeon: Algernon Huxley, MD;  Location: [place] [facility];  Service: Cardiovascular;  Laterality: N/A;    FAMILY HISTORY Family History  Problem Relation Age of Onset  . Diabetes Mother   . Hypertension Mother   . Stroke Mother   . Alzheimer's disease Mother   . Heart attack Father   . Hypertension Father   . Prostate cancer Brother   . Diabetes Brother   . Hypertension Brother   . Diabetes Sister   . Colon cancer Neg Hx   . Hypertension Daughter        ADVANCED DIRECTIVES:    HEALTH MAINTENANCE: Social History  Substance Use Topics  . Smoking status: Former Smoker    Quit date: 10/08/1988  . Smokeless tobacco: Never Used  . Alcohol Use: 0.0 oz/week    0 Standard drinks or equivalent per week     Comment: RARE     Colonoscopy:  PAP:  Bone density:  Lipid panel:  Allergies  Allergen Reactions  . Glimepiride Other (See Comments)    Leg stiffness  . Lipitor [Atorvastatin Calcium]     Tolerates crestor  . Oxycodone Other (See Comments)    Altered mental state  . Tape   .  Latex Rash    Current Outpatient Prescriptions  Medication Sig Dispense Refill  . aspirin EC 81 MG tablet Take 1 tablet (81 mg total) by mouth daily. 90 tablet 3  . carvedilol (COREG) 6.25 MG tablet Take 1 tablet (6.25 mg total) by mouth 2 (two) times daily with a meal. 180 tablet 3  . esomeprazole (NEXIUM) 20 MG capsule Take 20 mg by mouth daily as needed (HEARTBURN).    . Insulin Glargine (LANTUS SOLOSTAR) 100 UNIT/ML Solostar Pen Inject 5 units once a day.  Can add 1 unit per day if AM sugar is >150. 5 pen PRN  . lidocaine-prilocaine (EMLA) cream Apply 1 application topically as needed. Apply to  port 1 hour prior to chemotherapy appointment, cover with plastic wrap. 30 g 2  . lisinopril (PRINIVIL,ZESTRIL) 10 MG tablet Take 1 tablet (10 mg total) by mouth daily. 90 tablet 3  . metFORMIN (GLUCOPHAGE) 1000 MG tablet Take up to 1000mg  in the AM and 500mg  in the PM.  If GI upset, cut back to 1000mg  a day total.    . prochlorperazine (COMPAZINE) 10 MG tablet Take 1 tablet (10 mg total) by mouth every 6 (six) hours as needed for nausea or vomiting. 30 tablet 2  . rosuvastatin (CRESTOR) 20 MG tablet Take 0.5 tablets (10 mg total) by mouth daily.    . sitaGLIPtin (JANUVIA) 100 MG tablet Take 1 tablet (100 mg total) by mouth daily. 30 tablet 12   No current facility-administered medications for this visit.   Facility-Administered Medications Ordered in Other Visits  Medication Dose Route Frequency Provider Last Rate Last Dose  . heparin lock flush 100 unit/mL  500 Units Intracatheter Once PRN Lloyd Huger, MD      . sodium chloride 0.9 % injection 10 mL  10 mL Intracatheter PRN Lloyd Huger, MD   10 mL at 08/02/15 0852    OBJECTIVE: Filed Vitals:   07/26/15 0946  BP: 144/77  Pulse: 72  Temp: 97.2 F (36.2 C)  Resp: 16     Body mass index is 26.51 kg/(m^2).    ECOG FS:1 - Symptomatic but completely ambulatory  General: Well-developed, well-nourished, no acute distress. Eyes: Pink conjunctiva, anicteric sclera. Lungs: Clear to auscultation bilaterally. Heart: Regular rate and rhythm. No rubs, murmurs, or gallops. Abdomen: Soft, nontender, nondistended. No organomegaly noted, normoactive bowel sounds. Musculoskeletal: No edema, cyanosis, or clubbing. Neuro: Alert, answering all questions appropriately. Cranial nerves grossly intact. Skin: No rashes or petechiae noted. Psych: Normal affect.    LAB RESULTS:  Lab Results  Component Value Date   NA 137 08/02/2015   K 3.9 08/02/2015   CL 106 08/02/2015   CO2 25 08/02/2015   GLUCOSE 260* 08/02/2015   BUN 14 08/02/2015    CREATININE 0.73 08/02/2015   CALCIUM 8.3* 08/02/2015   PROT 6.3* 08/02/2015   ALBUMIN 3.5 08/02/2015   AST 20 08/02/2015   ALT 16* 08/02/2015   ALKPHOS 63 08/02/2015   BILITOT 0.4 08/02/2015   GFRNONAA >60 08/02/2015   GFRAA >60 08/02/2015    Lab Results  Component Value Date   WBC 5.2 08/02/2015   NEUTROABS 3.3 08/02/2015   HGB 12.4* 08/02/2015   HCT 38.1* 08/02/2015   MCV 79.0* 08/02/2015   PLT 104* 08/02/2015     STUDIES: No results found.  ASSESSMENT: Stage IV adenocarcinoma the pancreas.  PLAN:    1. Pancreatic cancer: Patient's imaging and pathology results from [facility] reviewed independently confirming stage IV disease.  PET scan results also reviewed independently and reported as with likely metastatic disease in patient's liver. Patient's most recent CA-19-9 is 397.  Proceed with cycle 2, day 8 of gemcitabine and Abraxane. Patient will receive this regimen on days 1, 8, and 15 with day 22 off. Will plan to reimage after 3 cycles. Return to clinic in 1 week for consideration of cycle 2, day 15. 2. Thrombocytopenia: Resolved.  3. Anemia: Mild, monitor.  Patient expressed understanding and was in agreement with this plan. He also understands that He can call clinic at any time with any questions, concerns, or complaints.   Lloyd Huger, MD   08/02/2015 9:25 AM

## 2015-08-02 NOTE — Progress Notes (Signed)
Patient's right eye became red and looked infected so they went for evaluation by his eye MD.  The family states that he was diagnosed as herpes simplex and he did have a fever blister on his lip that he may have touched then touched his eye.  They eye MD prescribed him an eye ointment and it has improved.

## 2015-08-02 NOTE — Progress Notes (Signed)
[place]  Telephone:(336) [phone] [phone]) [phone]  ID: Ronnie Johnson OB: Feb 24, 1941  MR#: [id number]  CWC#:[id number]  Patient Care Team: Tonia Ghent, MD as PCP - General (Family Medicine) Hessie Knows, MD as Referring Physician (Orthopedic Surgery) Clent Jacks, RN as Registered Nurse  CHIEF COMPLAINT:  Chief Complaint  Patient presents with  . Pancreatic Cancer    INTERVAL HISTORY: Patient returns to clinic today for further evaluation and consideration of cycle 2, day 1 of gemcitabine and Abraxane. He feels improved after having an extra week off from chemotherapy. He currently feels well and is asymptomatic. He denies any fevers.  He denies any chest pain or shortness of breath. He has occasional nausea, but denies any vomiting, constipation, or diarrhea. He has no urinary complaints. Patient offers no specific complaints today.  REVIEW OF SYSTEMS:   Review of Systems  Constitutional: Negative for fever, weight loss and malaise/fatigue.  Cardiovascular: Negative.   Gastrointestinal: Positive for nausea. Negative for abdominal pain.  Musculoskeletal: Negative.   Neurological: Negative for weakness.    As per HPI. Otherwise, a complete review of systems is negatve.  PAST MEDICAL HISTORY: Past Medical History  Diagnosis Date  . Hypertension   . DVT of leg (deep venous thrombosis) 2009  . Nephrolithiasis   . Hyperlipidemia   . Allergic rhinitis   . CAD (coronary artery disease)   . Urethral diverticulum   . Varicose vein of leg   . Dupuytren's contracture   . ED (erectile dysfunction)   . History of colonic polyps   . GERD (gastroesophageal reflux disease)   . Diverticulosis of colon   . Myocardial infarction   . DVT (deep venous thrombosis)   . Polio 1948    was hospitalized for 1 year  . Diabetes mellitus, type 2     Patient takes Metformin.  . SCC (squamous cell carcinoma)     scalp 2013 per derm    PAST SURGICAL  HISTORY: Past Surgical History  Procedure Laterality Date  . Appendectomy  1994  . Umbilical hernia repair  03/14/2005  . 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 Replacement total knee Left 2016  . Peripheral vascular catheterization N/A 06/23/2015    Procedure: [name];  Surgeon: Algernon Huxley, MD;  Location: [place] [facility];  Service: Cardiovascular;  Laterality: N/A;    FAMILY HISTORY Family History  Problem Relation Age of Onset  . Diabetes Mother   . Hypertension Mother   . Stroke Mother   . Alzheimer's disease Mother   . Heart attack Father   . Hypertension Father   . Prostate cancer Brother   . Diabetes Brother   . Hypertension Brother   . Diabetes Sister   . Colon cancer Neg Hx   . Hypertension Daughter        ADVANCED DIRECTIVES:    HEALTH MAINTENANCE: Social History  Substance Use Topics  . Smoking status: Former Smoker    Quit date: 10/08/1988  . Smokeless tobacco: Never Used  . Alcohol Use: 0.0 oz/week    0 Standard drinks or equivalent per week     Comment: RARE     Colonoscopy:  PAP:  Bone density:  Lipid panel:  Allergies  Allergen Reactions  . Glimepiride Other (See Comments)    Leg stiffness  . Lipitor [Atorvastatin Calcium]     Tolerates crestor  . Oxycodone Other (See Comments)    Altered mental state  .  Tape   . Latex Rash    Current Outpatient Prescriptions  Medication Sig Dispense Refill  . aspirin EC 81 MG tablet Take 1 tablet (81 mg total) by mouth daily. 90 tablet 3  . carvedilol (COREG) 6.25 MG tablet Take 1 tablet (6.25 mg total) by mouth 2 (two) times daily with a meal. 180 tablet 3  . esomeprazole (NEXIUM) 20 MG capsule Take 20 mg by mouth daily as needed (HEARTBURN).    . Insulin Glargine (LANTUS SOLOSTAR) 100 UNIT/ML Solostar Pen Inject 5 units once a day.  Can add 1 unit per day if AM sugar is >150. 5 pen PRN  . lidocaine-prilocaine (EMLA) cream Apply 1 application topically as needed. Apply to  port 1 hour prior to chemotherapy appointment, cover with plastic wrap. 30 g 2  . lisinopril (PRINIVIL,ZESTRIL) 10 MG tablet Take 1 tablet (10 mg total) by mouth daily. 90 tablet 3  . metFORMIN (GLUCOPHAGE) 1000 MG tablet Take up to 1000mg  in the AM and 500mg  in the PM.  If GI upset, cut back to 1000mg  a day total.    . prochlorperazine (COMPAZINE) 10 MG tablet Take 1 tablet (10 mg total) by mouth every 6 (six) hours as needed for nausea or vomiting. 30 tablet 2  . rosuvastatin (CRESTOR) 20 MG tablet Take 0.5 tablets (10 mg total) by mouth daily.    . sitaGLIPtin (JANUVIA) 100 MG tablet Take 1 tablet (100 mg total) by mouth daily. 30 tablet 12   No current facility-administered medications for this visit.   Facility-Administered Medications Ordered in Other Visits  Medication Dose Route Frequency Provider Last Rate Last Dose  . heparin lock flush 100 unit/mL  500 Units Intracatheter Once PRN Lloyd Huger, MD      . sodium chloride 0.9 % injection 10 mL  10 mL Intracatheter PRN Lloyd Huger, MD   10 mL at 08/02/15 0852    OBJECTIVE: Filed Vitals:   07/19/15 0932  BP: 136/63  Pulse: 70  Temp: 96.9 F (36.1 C)  Resp: 18     Body mass index is 26.72 kg/(m^2).    ECOG FS:1 - Symptomatic but completely ambulatory  General: Well-developed, well-nourished, no acute distress. Eyes: Pink conjunctiva, anicteric sclera. Lungs: Clear to auscultation bilaterally. Heart: Regular rate and rhythm. No rubs, murmurs, or gallops. Abdomen: Soft, nontender, nondistended. No organomegaly noted, normoactive bowel sounds. Musculoskeletal: No edema, cyanosis, or clubbing. Neuro: Alert, answering all questions appropriately. Cranial nerves grossly intact. Skin: No rashes or petechiae noted. Psych: Normal affect.    LAB RESULTS:  Lab Results  Component Value Date   NA 137 08/02/2015   K 3.9 08/02/2015   CL 106 08/02/2015   CO2 25 08/02/2015   GLUCOSE 260* 08/02/2015   BUN 14 08/02/2015    CREATININE 0.73 08/02/2015   CALCIUM 8.3* 08/02/2015   PROT 6.3* 08/02/2015   ALBUMIN 3.5 08/02/2015   AST 20 08/02/2015   ALT 16* 08/02/2015   ALKPHOS 63 08/02/2015   BILITOT 0.4 08/02/2015   GFRNONAA >60 08/02/2015   GFRAA >60 08/02/2015    Lab Results  Component Value Date   WBC 5.2 08/02/2015   NEUTROABS 3.3 08/02/2015   HGB 12.4* 08/02/2015   HCT 38.1* 08/02/2015   MCV 79.0* 08/02/2015   PLT 104* 08/02/2015     STUDIES: No results found.  ASSESSMENT: Stage IV adenocarcinoma the pancreas.  PLAN:    1. Pancreatic cancer: Patient's imaging and pathology results from [facility] reviewed independently confirming  stage IV disease.  PET scan results also reviewed independently and reported as with likely metastatic disease in patient's liver. Patient's most recent CA-19-9 is 397.  Proceed with cycle 2, day 1 of gemcitabine and Abraxane. Patient will receive this regimen on days 1, 8, and 15 with day 22 off. Will plan to reimage after 3 cycles. Return to clinic in 1 week for consideration of cycle 2, day 8. 2. Thrombocytopenia: Resolved..  Patient expressed understanding and was in agreement with this plan. He also understands that He can call clinic at any time with any questions, concerns, or complaints.   Lloyd Huger, MD   08/02/2015 9:23 AM

## 2015-08-12 ENCOUNTER — Other Ambulatory Visit: Payer: Self-pay | Admitting: *Deleted

## 2015-08-12 DIAGNOSIS — C787 Secondary malignant neoplasm of liver and intrahepatic bile duct: Principal | ICD-10-CM

## 2015-08-12 DIAGNOSIS — C259 Malignant neoplasm of pancreas, unspecified: Secondary | ICD-10-CM

## 2015-08-16 ENCOUNTER — Inpatient Hospital Stay (HOSPITAL_BASED_OUTPATIENT_CLINIC_OR_DEPARTMENT_OTHER): Payer: PPO | Admitting: Oncology

## 2015-08-16 ENCOUNTER — Inpatient Hospital Stay: Payer: PPO

## 2015-08-16 ENCOUNTER — Inpatient Hospital Stay: Payer: PPO | Attending: Oncology

## 2015-08-16 VITALS — BP 135/73 | HR 82 | Temp 96.1°F | Resp 18 | Wt 169.5 lb

## 2015-08-16 DIAGNOSIS — Z794 Long term (current) use of insulin: Secondary | ICD-10-CM | POA: Diagnosis not present

## 2015-08-16 DIAGNOSIS — Z7984 Long term (current) use of oral hypoglycemic drugs: Secondary | ICD-10-CM | POA: Insufficient documentation

## 2015-08-16 DIAGNOSIS — R600 Localized edema: Secondary | ICD-10-CM | POA: Diagnosis not present

## 2015-08-16 DIAGNOSIS — C787 Secondary malignant neoplasm of liver and intrahepatic bile duct: Secondary | ICD-10-CM | POA: Diagnosis not present

## 2015-08-16 DIAGNOSIS — E785 Hyperlipidemia, unspecified: Secondary | ICD-10-CM | POA: Insufficient documentation

## 2015-08-16 DIAGNOSIS — R5381 Other malaise: Secondary | ICD-10-CM | POA: Diagnosis not present

## 2015-08-16 DIAGNOSIS — I82403 Acute embolism and thrombosis of unspecified deep veins of lower extremity, bilateral: Secondary | ICD-10-CM | POA: Insufficient documentation

## 2015-08-16 DIAGNOSIS — Z79899 Other long term (current) drug therapy: Secondary | ICD-10-CM | POA: Diagnosis not present

## 2015-08-16 DIAGNOSIS — R110 Nausea: Secondary | ICD-10-CM | POA: Diagnosis not present

## 2015-08-16 DIAGNOSIS — R5383 Other fatigue: Secondary | ICD-10-CM | POA: Insufficient documentation

## 2015-08-16 DIAGNOSIS — E1165 Type 2 diabetes mellitus with hyperglycemia: Secondary | ICD-10-CM

## 2015-08-16 DIAGNOSIS — Z5111 Encounter for antineoplastic chemotherapy: Secondary | ICD-10-CM | POA: Insufficient documentation

## 2015-08-16 DIAGNOSIS — K219 Gastro-esophageal reflux disease without esophagitis: Secondary | ICD-10-CM | POA: Insufficient documentation

## 2015-08-16 DIAGNOSIS — R100 Acute abdomen: Secondary | ICD-10-CM

## 2015-08-16 DIAGNOSIS — K769 Liver disease, unspecified: Secondary | ICD-10-CM | POA: Diagnosis not present

## 2015-08-16 DIAGNOSIS — D649 Anemia, unspecified: Secondary | ICD-10-CM

## 2015-08-16 DIAGNOSIS — I10 Essential (primary) hypertension: Secondary | ICD-10-CM | POA: Insufficient documentation

## 2015-08-16 DIAGNOSIS — Z87891 Personal history of nicotine dependence: Secondary | ICD-10-CM | POA: Insufficient documentation

## 2015-08-16 DIAGNOSIS — C259 Malignant neoplasm of pancreas, unspecified: Secondary | ICD-10-CM

## 2015-08-16 DIAGNOSIS — Z85828 Personal history of other malignant neoplasm of skin: Secondary | ICD-10-CM | POA: Insufficient documentation

## 2015-08-16 DIAGNOSIS — I2510 Atherosclerotic heart disease of native coronary artery without angina pectoris: Secondary | ICD-10-CM | POA: Diagnosis not present

## 2015-08-16 DIAGNOSIS — Z87442 Personal history of urinary calculi: Secondary | ICD-10-CM | POA: Insufficient documentation

## 2015-08-16 DIAGNOSIS — I252 Old myocardial infarction: Secondary | ICD-10-CM | POA: Insufficient documentation

## 2015-08-16 DIAGNOSIS — Z7901 Long term (current) use of anticoagulants: Secondary | ICD-10-CM | POA: Insufficient documentation

## 2015-08-16 DIAGNOSIS — Z7982 Long term (current) use of aspirin: Secondary | ICD-10-CM | POA: Diagnosis not present

## 2015-08-16 LAB — COMPREHENSIVE METABOLIC PANEL
ALBUMIN: 3.3 g/dL — AB (ref 3.5–5.0)
ALK PHOS: 62 U/L (ref 38–126)
ALT: 11 U/L — AB (ref 17–63)
AST: 22 U/L (ref 15–41)
Anion gap: 8 (ref 5–15)
BILIRUBIN TOTAL: 0.3 mg/dL (ref 0.3–1.2)
BUN: 9 mg/dL (ref 6–20)
CALCIUM: 8.6 mg/dL — AB (ref 8.9–10.3)
CO2: 26 mmol/L (ref 22–32)
CREATININE: 0.65 mg/dL (ref 0.61–1.24)
Chloride: 104 mmol/L (ref 101–111)
GFR calc Af Amer: >60 mL/min (ref >60)
GFR calc non Af Amer: >60 mL/min (ref >60)
GLUCOSE: 296 mg/dL — AB (ref 65–99)
Potassium: 3.3 mmol/L — ABNORMAL LOW (ref 3.5–5.1)
SODIUM: 138 mmol/L (ref 135–145)
TOTAL PROTEIN: 6.2 g/dL — AB (ref 6.5–8.1)

## 2015-08-16 LAB — CBC WITH DIFFERENTIAL/PLATELET
BASOS PCT: 1 %
Basophils Absolute: 0.1 10*3/uL (ref 0–0.1)
Eosinophils Absolute: 0.2 10*3/uL (ref 0–0.7)
Eosinophils Relative: 3 %
HEMATOCRIT: 34.8 % — AB (ref 40.0–52.0)
HEMOGLOBIN: 11.4 g/dL — AB (ref 13.0–18.0)
Lymphocytes Relative: 14 %
Lymphs Abs: 1.1 10*3/uL (ref 1.0–3.6)
MCH: 25.9 pg — ABNORMAL LOW (ref 26.0–34.0)
MCHC: 32.8 g/dL (ref 32.0–36.0)
MCV: 78.9 fL — ABNORMAL LOW (ref 80.0–100.0)
MONOS PCT: 12 %
Monocytes Absolute: 0.9 10*3/uL (ref 0.2–1.0)
NEUTROS ABS: 5.3 10*3/uL (ref 1.4–6.5)
Neutrophils Relative %: 70 %
Platelets: 254 10*3/uL (ref 150–440)
RBC: 4.41 MIL/uL (ref 4.40–5.90)
RDW: 16.9 % — ABNORMAL HIGH (ref 11.5–14.5)
WBC: 7.5 10*3/uL (ref 3.8–10.6)

## 2015-08-16 MED ORDER — SODIUM CHLORIDE 0.9 % IJ SOLN
10.0000 mL | INTRAMUSCULAR | Status: DC | PRN
  Administered 2015-08-16: 10 mL via INTRAVENOUS
  Filled 2015-08-16: qty 10

## 2015-08-16 MED ORDER — HEPARIN SOD (PORK) LOCK FLUSH 100 UNIT/ML IV SOLN
500.0000 [IU] | Freq: Once | INTRAVENOUS | Status: AC
  Administered 2015-08-16: 500 [IU] via INTRAVENOUS
  Filled 2015-08-16: qty 5

## 2015-08-16 MED ORDER — PACLITAXEL PROTEIN-BOUND CHEMO INJECTION 100 MG
125.0000 mg/m2 | Freq: Once | INTRAVENOUS | Status: AC
  Administered 2015-08-16: 250 mg via INTRAVENOUS
  Filled 2015-08-16: qty 50

## 2015-08-16 MED ORDER — SODIUM CHLORIDE 0.9 % IV SOLN
Freq: Once | INTRAVENOUS | Status: AC
  Administered 2015-08-16: 11:00:00 via INTRAVENOUS
  Filled 2015-08-16: qty 4

## 2015-08-16 MED ORDER — GEMCITABINE HCL CHEMO INJECTION 1 GM/26.3ML
2000.0000 mg | Freq: Once | INTRAVENOUS | Status: AC
  Administered 2015-08-16: 2000 mg via INTRAVENOUS
  Filled 2015-08-16: qty 52.6

## 2015-08-16 MED ORDER — SODIUM CHLORIDE 0.9 % IV SOLN
Freq: Once | INTRAVENOUS | Status: AC
  Administered 2015-08-16: 11:00:00 via INTRAVENOUS
  Filled 2015-08-16: qty 1000

## 2015-08-17 LAB — CANCER ANTIGEN 19-9: CA 19-9: 378 U/mL — ABNORMAL HIGH (ref 0–35)

## 2015-08-22 ENCOUNTER — Ambulatory Visit
Admission: RE | Admit: 2015-08-22 | Discharge: 2015-08-22 | Disposition: A | Discharge status: Home / Self Care | Payer: PPO | Source: Ambulatory Visit | Attending: Oncology | Admitting: Oncology

## 2015-08-22 ENCOUNTER — Inpatient Hospital Stay: Payer: PPO

## 2015-08-22 ENCOUNTER — Telehealth: Payer: Self-pay | Admitting: *Deleted

## 2015-08-22 ENCOUNTER — Inpatient Hospital Stay (HOSPITAL_BASED_OUTPATIENT_CLINIC_OR_DEPARTMENT_OTHER): Payer: PPO | Admitting: Oncology

## 2015-08-22 ENCOUNTER — Other Ambulatory Visit: Payer: Self-pay | Admitting: *Deleted

## 2015-08-22 VITALS — BP 144/80 | HR 79 | Temp 96.0°F | Resp 16 | Wt 166.4 lb

## 2015-08-22 DIAGNOSIS — D649 Anemia, unspecified: Secondary | ICD-10-CM | POA: Diagnosis not present

## 2015-08-22 DIAGNOSIS — R600 Localized edema: Secondary | ICD-10-CM

## 2015-08-22 DIAGNOSIS — C787 Secondary malignant neoplasm of liver and intrahepatic bile duct: Secondary | ICD-10-CM

## 2015-08-22 DIAGNOSIS — Z5111 Encounter for antineoplastic chemotherapy: Secondary | ICD-10-CM | POA: Diagnosis not present

## 2015-08-22 DIAGNOSIS — I82411 Acute embolism and thrombosis of right femoral vein: Secondary | ICD-10-CM | POA: Insufficient documentation

## 2015-08-22 DIAGNOSIS — R110 Nausea: Secondary | ICD-10-CM

## 2015-08-22 DIAGNOSIS — I82403 Acute embolism and thrombosis of unspecified deep veins of lower extremity, bilateral: Secondary | ICD-10-CM | POA: Diagnosis not present

## 2015-08-22 DIAGNOSIS — C259 Malignant neoplasm of pancreas, unspecified: Secondary | ICD-10-CM

## 2015-08-22 DIAGNOSIS — E1165 Type 2 diabetes mellitus with hyperglycemia: Secondary | ICD-10-CM

## 2015-08-22 DIAGNOSIS — Z86718 Personal history of other venous thrombosis and embolism: Secondary | ICD-10-CM | POA: Diagnosis present

## 2015-08-22 DIAGNOSIS — K219 Gastro-esophageal reflux disease without esophagitis: Secondary | ICD-10-CM

## 2015-08-22 DIAGNOSIS — I252 Old myocardial infarction: Secondary | ICD-10-CM

## 2015-08-22 DIAGNOSIS — M79605 Pain in left leg: Secondary | ICD-10-CM

## 2015-08-22 DIAGNOSIS — Z7901 Long term (current) use of anticoagulants: Secondary | ICD-10-CM | POA: Diagnosis not present

## 2015-08-22 DIAGNOSIS — I2510 Atherosclerotic heart disease of native coronary artery without angina pectoris: Secondary | ICD-10-CM

## 2015-08-22 DIAGNOSIS — E785 Hyperlipidemia, unspecified: Secondary | ICD-10-CM

## 2015-08-22 DIAGNOSIS — I10 Essential (primary) hypertension: Secondary | ICD-10-CM

## 2015-08-22 DIAGNOSIS — Z794 Long term (current) use of insulin: Secondary | ICD-10-CM

## 2015-08-22 LAB — CBC WITH DIFFERENTIAL/PLATELET
BASOS ABS: 0.1 10*3/uL (ref 0–0.1)
BASOS PCT: 1 %
EOS PCT: 1 %
Eosinophils Absolute: 0 10*3/uL (ref 0–0.7)
HEMATOCRIT: 34 % — AB (ref 40.0–52.0)
Hemoglobin: 11 g/dL — ABNORMAL LOW (ref 13.0–18.0)
Lymphocytes Relative: 18 %
Lymphs Abs: 0.9 10*3/uL — ABNORMAL LOW (ref 1.0–3.6)
MCH: 25.5 pg — ABNORMAL LOW (ref 26.0–34.0)
MCHC: 32.2 g/dL (ref 32.0–36.0)
MCV: 79 fL — ABNORMAL LOW (ref 80.0–100.0)
MONO ABS: 0.5 10*3/uL (ref 0.2–1.0)
Monocytes Relative: 10 %
NEUTROS ABS: 3.4 10*3/uL (ref 1.4–6.5)
Neutrophils Relative %: 70 %
PLATELETS: 223 10*3/uL (ref 150–440)
RBC: 4.31 MIL/uL — ABNORMAL LOW (ref 4.40–5.90)
RDW: 16.5 % — AB (ref 11.5–14.5)
WBC: 4.9 10*3/uL (ref 3.8–10.6)

## 2015-08-22 LAB — COMPREHENSIVE METABOLIC PANEL
ALBUMIN: 3.3 g/dL — AB (ref 3.5–5.0)
ALK PHOS: 55 U/L (ref 38–126)
ALT: 11 U/L — ABNORMAL LOW (ref 17–63)
ANION GAP: 7 (ref 5–15)
AST: 12 U/L — ABNORMAL LOW (ref 15–41)
BILIRUBIN TOTAL: 0.4 mg/dL (ref 0.3–1.2)
BUN: 11 mg/dL (ref 6–20)
CALCIUM: 8.8 mg/dL — AB (ref 8.9–10.3)
CO2: 29 mmol/L (ref 22–32)
Chloride: 101 mmol/L (ref 101–111)
Creatinine, Ser: 0.69 mg/dL (ref 0.61–1.24)
GFR calc Af Amer: >60 mL/min (ref >60)
GLUCOSE: 268 mg/dL — AB (ref 65–99)
Potassium: 3.8 mmol/L (ref 3.5–5.1)
Sodium: 137 mmol/L (ref 135–145)
TOTAL PROTEIN: 6.6 g/dL (ref 6.5–8.1)

## 2015-08-22 MED ORDER — ENOXAPARIN SODIUM 120 MG/0.8ML ~~LOC~~ SOLN
120.0000 mg | Freq: Once | SUBCUTANEOUS | Status: AC
  Administered 2015-08-22: 120 mg via SUBCUTANEOUS
  Filled 2015-08-22: qty 0.8

## 2015-08-22 MED ORDER — WARFARIN SODIUM 5 MG PO TABS: 5.0000 mg | ORAL_TABLET | Freq: Every day | ORAL | Status: DC

## 2015-08-22 MED ORDER — ENOXAPARIN SODIUM 80 MG/0.8ML ~~LOC~~ SOLN: 80.0000 mg | Freq: Two times a day (BID) | SUBCUTANEOUS | Status: DC

## 2015-08-22 NOTE — Telephone Encounter (Signed)
[place] LLE ordered per VO Dr Grayland Ormond and scheduling notified to order exam and call pt with time

## 2015-08-22 NOTE — Addendum Note (Signed)
Addended by: Betti Cruz on: 08/22/2015 10:54 AM   Modules accepted: Orders

## 2015-08-22 NOTE — Addendum Note (Signed)
Addended by: Betti Cruz on: 08/22/2015 10:41 AM   Modules accepted: Orders

## 2015-08-22 NOTE — Addendum Note (Signed)
Addended by: Betti Cruz on: 08/22/2015 10:32 AM   Modules accepted: Orders

## 2015-08-22 NOTE — Telephone Encounter (Signed)
Called to report that he has painful swelling and redness above ankle on the leg he had surgery on. Asking if he needs to see Dr Grayland Ormond or PMD

## 2015-08-22 NOTE — Progress Notes (Signed)
Patient was having bilateral ankle edema, redness, with pain so they called the office and an U/S was ordered.  They are here to discuss results.

## 2015-08-22 NOTE — Addendum Note (Signed)
Addended by: Betti Cruz on: 08/22/2015 12:37 PM   Modules accepted: Orders

## 2015-08-23 ENCOUNTER — Inpatient Hospital Stay: Payer: PPO

## 2015-08-23 ENCOUNTER — Telehealth: Payer: Self-pay | Admitting: Family Medicine

## 2015-08-23 ENCOUNTER — Inpatient Hospital Stay: Payer: PPO | Admitting: Oncology

## 2015-08-23 ENCOUNTER — Ambulatory Visit (INDEPENDENT_AMBULATORY_CARE_PROVIDER_SITE_OTHER): Payer: PPO

## 2015-08-23 VITALS — BP 146/78 | HR 78 | Temp 96.3°F | Resp 18

## 2015-08-23 DIAGNOSIS — C787 Secondary malignant neoplasm of liver and intrahepatic bile duct: Principal | ICD-10-CM

## 2015-08-23 DIAGNOSIS — Z23 Encounter for immunization: Secondary | ICD-10-CM | POA: Diagnosis not present

## 2015-08-23 DIAGNOSIS — Z5111 Encounter for antineoplastic chemotherapy: Secondary | ICD-10-CM | POA: Diagnosis not present

## 2015-08-23 DIAGNOSIS — C259 Malignant neoplasm of pancreas, unspecified: Secondary | ICD-10-CM

## 2015-08-23 MED ORDER — SODIUM CHLORIDE 0.9 % IV SOLN
Freq: Once | INTRAVENOUS | Status: AC
  Administered 2015-08-23: 10:00:00 via INTRAVENOUS
  Filled 2015-08-23: qty 4

## 2015-08-23 MED ORDER — HEPARIN SOD (PORK) LOCK FLUSH 100 UNIT/ML IV SOLN
500.0000 [IU] | Freq: Once | INTRAVENOUS | Status: AC
  Administered 2015-08-23: 500 [IU] via INTRAVENOUS
  Filled 2015-08-23: qty 5

## 2015-08-23 MED ORDER — PACLITAXEL PROTEIN-BOUND CHEMO INJECTION 100 MG
125.0000 mg/m2 | Freq: Once | INTRAVENOUS | Status: AC
  Administered 2015-08-23: 250 mg via INTRAVENOUS
  Filled 2015-08-23: qty 50

## 2015-08-23 MED ORDER — SODIUM CHLORIDE 0.9 % IV SOLN
2000.0000 mg | Freq: Once | INTRAVENOUS | Status: AC
  Administered 2015-08-23: 2000 mg via INTRAVENOUS
  Filled 2015-08-23: qty 52.6

## 2015-08-23 MED ORDER — SODIUM CHLORIDE 0.9 % IJ SOLN
10.0000 mL | INTRAMUSCULAR | Status: DC | PRN
  Filled 2015-08-23: qty 10

## 2015-08-23 MED ORDER — SODIUM CHLORIDE 0.9 % IV SOLN
Freq: Once | INTRAVENOUS | Status: AC
  Administered 2015-08-23: 10:00:00 via INTRAVENOUS
  Filled 2015-08-23: qty 1000

## 2015-08-23 NOTE — Telephone Encounter (Signed)
Pt's spouse Izora Gala called, she and pt are both coming in today for Flu injections at 2 pm.  She would like PCP to give written instructions on pt's activity levels.  Offered to r/s injection appointments to 4:15 pm today so pt could consult with Dr. Damita Dunnings.  Wife declined.  Just wants written instructions at front desk for them to pick up at injection appointments at 2 pm.  Best number to call is [phone]

## 2015-08-23 NOTE — Telephone Encounter (Signed)
Left detailed message on voicemail.   I am assuming that a note was picked up at the 2 pm flu shot appt.

## 2015-08-23 NOTE — Telephone Encounter (Signed)
I would advise activity as tolerated by patient, noting that some days will likely be better than others and his activity level will likely be variable.

## 2015-08-24 NOTE — Telephone Encounter (Signed)
Patient's wife notified as instructed by telephone and verbalized understanding. 

## 2015-08-24 NOTE — Telephone Encounter (Signed)
I would still presume up as tolerated, assuming that being up didn't worsen the swelling/discomfort in his legs.

## 2015-08-24 NOTE — Telephone Encounter (Signed)
Spouse called stating Ronnie Johnson has blood clots in both legs.  Ronnie Johnson had ultrasound done Tuesday ordered by dr finnagin @ [facility].  She wanted to know what activities can he do?  Can he walk around should he be in a chair with legs up. Ronnie Johnson is taking levinox shots 2 x daily and 5mg  of comdinuan daily

## 2015-08-26 NOTE — Progress Notes (Signed)
[place]  Telephone:(336) [phone] Fax:(336) [phone]  ID: Ronnie Johnson OB: 1941-09-03  MR#: [id number]  [id number]  Patient Care Team: Tonia Ghent, MD as PCP - General (Family Medicine) Hessie Knows, MD as Referring Physician (Orthopedic Surgery) Clent Jacks, RN as Registered Nurse  CHIEF COMPLAINT:  Chief Complaint  Patient presents with  . Follow-up    discuss LE dopplers results    INTERVAL HISTORY: Patient returns to clinic today as an add-on after complaining of bilateral ankle pain and swelling. Subsequent ultrasound revealed bilateral DVT. He otherwise feels well. He is scheduled for cycle 3, day 1 of gemcitabine and Abraxane tomorrow. He denies any fevers.  He denies any chest pain or shortness of breath. He has occasional nausea, but denies any vomiting, constipation, or diarrhea. He has no urinary complaints. Patient offers no further specific complaints today.  REVIEW OF SYSTEMS:   Review of Systems  Constitutional: Negative for fever, weight loss and malaise/fatigue.  Eyes: Negative for pain, discharge and redness.  Cardiovascular: Positive for leg swelling.  Gastrointestinal: Positive for nausea. Negative for abdominal pain.  Musculoskeletal: Negative.   Neurological: Negative for weakness.  Endo/Heme/Allergies: Does not bruise/bleed easily.    As per HPI. Otherwise, a complete review of systems is negatve.  PAST MEDICAL HISTORY: Past Medical History  Diagnosis Date  . Hypertension   . DVT of leg (deep venous thrombosis) 2009  . Nephrolithiasis   . Hyperlipidemia   . Allergic rhinitis   . CAD (coronary artery disease)   . Urethral diverticulum   . Varicose vein of leg   . Dupuytren's contracture   . ED (erectile dysfunction)   . History of colonic polyps   . GERD (gastroesophageal reflux disease)   . Diverticulosis of colon   . Myocardial infarction ([place])   . DVT (deep venous thrombosis)   . Polio 1948    was  hospitalized for 1 year  . Diabetes mellitus, type 2     Patient takes Metformin.  . SCC (squamous cell carcinoma)     scalp 2013 per derm    PAST SURGICAL HISTORY: Past Surgical History  Procedure Laterality Date  . Appendectomy  1994  . Umbilical hernia repair  03/14/2005  . 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 Replacement total knee Left 2016  . Peripheral vascular catheterization N/A 06/23/2015    Procedure: [name];  Surgeon: Algernon Huxley, MD;  Location: [facility];  Service: Cardiovascular;  Laterality: N/A;    FAMILY HISTORY Family History  Problem Relation Age of Onset  . Diabetes Mother   . Hypertension Mother   . Stroke Mother   . Alzheimer's disease Mother   . Heart attack Father   . Hypertension Father   . Prostate cancer Brother   . Diabetes Brother   . Hypertension Brother   . Diabetes Sister   . Colon cancer Neg Hx   . Hypertension Daughter        ADVANCED DIRECTIVES:    HEALTH MAINTENANCE: Social History  Substance Use Topics  . Smoking status: Former Smoker    Quit date: 10/08/1988  . Smokeless tobacco: Never Used  . Alcohol Use: 0.0 oz/week    0 Standard drinks or equivalent per week     Comment: RARE     Colonoscopy:  PAP:  Bone density:  Lipid panel:  Allergies  Allergen Reactions  . Glimepiride Other (See Comments)  Leg stiffness  . Lipitor [Atorvastatin Calcium]     Tolerates crestor  . Oxycodone Other (See Comments)    Altered mental state  . Tape   . Latex Rash    Current Outpatient Prescriptions  Medication Sig Dispense Refill  . aspirin EC 81 MG tablet Take 1 tablet (81 mg total) by mouth daily. 90 tablet 3  . carvedilol (COREG) 6.25 MG tablet Take 1 tablet (6.25 mg total) by mouth 2 (two) times daily with a meal. 180 tablet 3  . esomeprazole (NEXIUM) 20 MG capsule Take 20 mg by mouth daily as needed (HEARTBURN).    . Insulin Glargine (LANTUS SOLOSTAR) 100 UNIT/ML Solostar Pen Inject 5  units once a day.  Can add 1 unit per day if AM sugar is >150. 5 pen PRN  . lidocaine-prilocaine (EMLA) cream Apply 1 application topically as needed. Apply to port 1 hour prior to chemotherapy appointment, cover with plastic wrap. 30 g 2  . lisinopril (PRINIVIL,ZESTRIL) 10 MG tablet Take 1 tablet (10 mg total) by mouth daily. 90 tablet 3  . metFORMIN (GLUCOPHAGE) 1000 MG tablet Take up to 1000mg  in the AM and 500mg  in the PM.  If GI upset, cut back to 1000mg  a day total.    . prochlorperazine (COMPAZINE) 10 MG tablet Take 1 tablet (10 mg total) by mouth every 6 (six) hours as needed for nausea or vomiting. 30 tablet 2  . rosuvastatin (CRESTOR) 20 MG tablet Take 0.5 tablets (10 mg total) by mouth daily.    . sitaGLIPtin (JANUVIA) 100 MG tablet Take 1 tablet (100 mg total) by mouth daily. 30 tablet 12  . ZIRGAN 0.15 % GEL APPLY 0.25 INCH RIBBON IN RIGHT EYE FIVE TIMES DAILY  1  . enoxaparin (LOVENOX) 80 MG/0.8ML injection Inject 0.8 mLs (80 mg total) into the skin every 12 (twelve) hours. For 5 days. 14 Syringe 1  . warfarin (COUMADIN) 5 MG tablet Take 1 tablet (5 mg total) by mouth daily. 30 tablet 5   No current facility-administered medications for this visit.    OBJECTIVE: Filed Vitals:   08/22/15 1347  BP: 144/80  Pulse: 79  Temp: 96 F (35.6 C)  Resp: 16     Body mass index is 26.06 kg/(m^2).    ECOG FS:1 - Symptomatic but completely ambulatory  General: Well-developed, well-nourished, no acute distress. Eyes: Pink conjunctiva, anicteric sclera. Lungs: Clear to auscultation bilaterally. Heart: Regular rate and rhythm. No rubs, murmurs, or gallops. Abdomen: Soft, nontender, nondistended. No organomegaly noted, normoactive bowel sounds. Musculoskeletal: Minimal bilateral edema, mild erythema of bilateral ankles. Neuro: Alert, answering all questions appropriately. Cranial nerves grossly intact. Skin: No rashes or petechiae noted. Psych: Normal affect.    LAB RESULTS:  Lab  Results  Component Value Date   NA 137 08/22/2015   K 3.8 08/22/2015   CL 101 08/22/2015   CO2 29 08/22/2015   GLUCOSE 268* 08/22/2015   BUN 11 08/22/2015   CREATININE 0.69 08/22/2015   CALCIUM 8.8* 08/22/2015   PROT 6.6 08/22/2015   ALBUMIN 3.3* 08/22/2015   AST 12* 08/22/2015   ALT 11* 08/22/2015   ALKPHOS 55 08/22/2015   BILITOT 0.4 08/22/2015   GFRNONAA >60 08/22/2015   GFRAA >60 08/22/2015    Lab Results  Component Value Date   WBC 4.9 08/22/2015   NEUTROABS 3.4 08/22/2015   HGB 11.0* 08/22/2015   HCT 34.0* 08/22/2015   MCV 79.0* 08/22/2015   PLT 223 08/22/2015     STUDIES:  US Venous Img Lower Bilateral  08/22/2015  CLINICAL DATA:  Bilateral lower extremity pain and redness, history of deep venous thrombosis EXAM: BILATERAL LOWER EXTREMITY VENOUS DOPPLER ULTRASOUND TECHNIQUE: Gray-scale sonography with graded compression, as well as color Doppler and duplex ultrasound were performed to evaluate the lower extremity deep venous systems from the level of the common femoral vein and including the common femoral, femoral, profunda femoral, popliteal and calf veins including the posterior tibial, peroneal and gastrocnemius veins when visible. The superficial great saphenous vein was also interrogated. Spectral Doppler was utilized to evaluate flow at rest and with distal augmentation maneuvers in the common femoral, femoral and popliteal veins. COMPARISON:  None. FINDINGS: RIGHT LOWER EXTREMITY Common Femoral Vein: Thrombus is identified, which is nonocclusive. The vein is compressible and shows phasic flow with augmentation. Saphenofemoral Junction: No evidence of thrombus. Normal compressibility and flow on color Doppler imaging. Profunda Femoral Vein: No evidence of thrombus. Normal compressibility and flow on color Doppler imaging. Femoral Vein: There is thrombus which is nonocclusive. The vein is not compressible. There is normal phasicity and augmentation. There is some  calcification identified within the thrombus. Popliteal Vein: There is nonocclusive thrombus. The vein is not compressible. There is normal phasicity and augmentation. Calf Veins: There is thrombus in the peroneal and posterior tibial vein. Thrombus is not occlusive. There is incomplete compressibility. LEFT LOWER EXTREMITY Common Femoral Vein: Nonocclusive thrombus with absence of compressibility. There is phasic flow. Saphenofemoral Junction: No evidence of thrombus. Normal compressibility and flow on color Doppler imaging. Profunda Femoral Vein: There is nonocclusive thrombus. Femoral Vein: Nonocclusive thrombus with noncompressible vein. Phasic flow identified. Popliteal Vein: Nonocclusive thrombus with absence of compressibility. There is phasic flow. Calf Veins: Posterior tibial vein demonstrates nonocclusive thrombus and absence of compressibility. Perineal vein show similar appearance. IMPRESSION: Bilateral deep venous thrombosis. The appearance on the right suggests the possibility of new thrombus superimposed on subacute to chronic thrombus. Thrombus on the left appears to be acute. These results will be called to the ordering clinician or representative by the Radiology Department at the imaging location. Patient is returning to referring physician's office for an appointment at 1330 hours. Electronically Signed   By: Skipper Cliche M.D.   On: 08/22/2015 13:20    ASSESSMENT: Stage IV adenocarcinoma the pancreas.  PLAN:    1. Pancreatic cancer: Patient's imaging and pathology results from [facility] reviewed independently confirming stage IV disease.  PET scan results also reviewed independently and reported as with likely metastatic disease in patient's liver. Patient's most recent CA-19-9 is 397.  Proceed with cycle 3, day 1 of gemcitabine and Abraxane tomorrow. Patient will receive this regimen on days 1, 8, and 15 with day 22 off. Will plan to reimage after 3 cycles. Return to clinic in  August 30, 2015 for consideration of cycle 3, day 8. 2. Thrombocytopenia: Resolved., Monitor. 3. Anemia: Mild, monitor. 4. Hyperglycemia:  Continue diabetic medications as prescribed.  5. DVTs: Likely secondary to decreased activity and malignancy. Patient has requested to be placed on Coumadin rather than one of the newer anticoagulants. Therefore he was given a Lovenox bridge and initiated on 5 mg Coumadin daily. Will check INR and next clinic visit. Goal INR 2.0-3.0.  Patient expressed understanding and was in agreement with this plan. He also understands that He can call clinic at any time with any questions, concerns, or complaints.   Lloyd Huger, MD   08/26/2015 4:26 PM

## 2015-08-28 NOTE — Progress Notes (Signed)
[place]  Telephone:(336) [phone] Fax:(336) [phone]  ID: Miguelito Schnaible OB: 06-Jul-1941  MR#: [id number]  [id number]  Patient Care Team: Tonia Ghent, MD as PCP - General (Family Medicine) Hessie Knows, MD as Referring Physician (Orthopedic Surgery) Clent Jacks, RN as Registered Nurse  CHIEF COMPLAINT:  Chief Complaint  Patient presents with  . Pancreatic Cancer    INTERVAL HISTORY: Patient returns to clinic today for further evaluation and consideration of cycle 3, day 1 of gemcitabine and Abraxane. He is tolerating his treatments well without significant side effects. He currently feels well and is asymptomatic. He denies any fevers.  He denies any chest pain or shortness of breath. He has occasional nausea, but denies any vomiting, constipation, or diarrhea. He has no urinary complaints. Patient offers no specific complaints today.  REVIEW OF SYSTEMS:   Review of Systems  Constitutional: Negative for fever, weight loss and malaise/fatigue.  Eyes: Negative for pain, discharge and redness.  Cardiovascular: Negative.   Gastrointestinal: Positive for nausea. Negative for abdominal pain.  Musculoskeletal: Negative.   Neurological: Negative for weakness.  Endo/Heme/Allergies: Does not bruise/bleed easily.    As per HPI. Otherwise, a complete review of systems is negatve.  PAST MEDICAL HISTORY: Past Medical History  Diagnosis Date  . Hypertension   . DVT of leg (deep venous thrombosis) 2009  . Nephrolithiasis   . Hyperlipidemia   . Allergic rhinitis   . CAD (coronary artery disease)   . Urethral diverticulum   . Varicose vein of leg   . Dupuytren's contracture   . ED (erectile dysfunction)   . History of colonic polyps   . GERD (gastroesophageal reflux disease)   . Diverticulosis of colon   . Myocardial infarction ([place])   . DVT (deep venous thrombosis)   . Polio 1948    was hospitalized for 1 year  . Diabetes mellitus, type 2    Patient takes Metformin.  . SCC (squamous cell carcinoma)     scalp 2013 per derm    PAST SURGICAL HISTORY: Past Surgical History  Procedure Laterality Date  . Appendectomy  1994  . Umbilical hernia repair  03/14/2005  . 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 Replacement total knee Left 2016  . Peripheral vascular catheterization N/A 06/23/2015    Procedure: [name];  Surgeon: Algernon Huxley, MD;  Location: [facility];  Service: Cardiovascular;  Laterality: N/A;    FAMILY HISTORY Family History  Problem Relation Age of Onset  . Diabetes Mother   . Hypertension Mother   . Stroke Mother   . Alzheimer's disease Mother   . Heart attack Father   . Hypertension Father   . Prostate cancer Brother   . Diabetes Brother   . Hypertension Brother   . Diabetes Sister   . Colon cancer Neg Hx   . Hypertension Daughter        ADVANCED DIRECTIVES:    HEALTH MAINTENANCE: Social History  Substance Use Topics  . Smoking status: Former Smoker    Quit date: 10/08/1988  . Smokeless tobacco: Never Used  . Alcohol Use: 0.0 oz/week    0 Standard drinks or equivalent per week     Comment: RARE     Colonoscopy:  PAP:  Bone density:  Lipid panel:  Allergies  Allergen Reactions  . Glimepiride Other (See Comments)    Leg stiffness  . Lipitor [Atorvastatin Calcium]     Tolerates crestor  .  Oxycodone Other (See Comments)    Altered mental state  . Tape   . Latex Rash    Current Outpatient Prescriptions  Medication Sig Dispense Refill  . aspirin EC 81 MG tablet Take 1 tablet (81 mg total) by mouth daily. 90 tablet 3  . carvedilol (COREG) 6.25 MG tablet Take 1 tablet (6.25 mg total) by mouth 2 (two) times daily with a meal. 180 tablet 3  . esomeprazole (NEXIUM) 20 MG capsule Take 20 mg by mouth daily as needed (HEARTBURN).    . Insulin Glargine (LANTUS SOLOSTAR) 100 UNIT/ML Solostar Pen Inject 5 units once a day.  Can add 1 unit per day if AM sugar is  >150. 5 pen PRN  . lidocaine-prilocaine (EMLA) cream Apply 1 application topically as needed. Apply to port 1 hour prior to chemotherapy appointment, cover with plastic wrap. 30 g 2  . lisinopril (PRINIVIL,ZESTRIL) 10 MG tablet Take 1 tablet (10 mg total) by mouth daily. 90 tablet 3  . metFORMIN (GLUCOPHAGE) 1000 MG tablet Take up to 1000mg  in the AM and 500mg  in the PM.  If GI upset, cut back to 1000mg  a day total.    . prochlorperazine (COMPAZINE) 10 MG tablet Take 1 tablet (10 mg total) by mouth every 6 (six) hours as needed for nausea or vomiting. 30 tablet 2  . rosuvastatin (CRESTOR) 20 MG tablet Take 0.5 tablets (10 mg total) by mouth daily.    . sitaGLIPtin (JANUVIA) 100 MG tablet Take 1 tablet (100 mg total) by mouth daily. 30 tablet 12  . ZIRGAN 0.15 % GEL APPLY 0.25 INCH RIBBON IN RIGHT EYE FIVE TIMES DAILY  1  . enoxaparin (LOVENOX) 80 MG/0.8ML injection Inject 0.8 mLs (80 mg total) into the skin every 12 (twelve) hours. For 5 days. 14 Syringe 1  . warfarin (COUMADIN) 5 MG tablet Take 1 tablet (5 mg total) by mouth daily. 30 tablet 5   No current facility-administered medications for this visit.    OBJECTIVE: Filed Vitals:   08/16/15 0934  BP: 135/73  Pulse: 82  Temp: 96.1 F (35.6 C)  Resp: 18     Body mass index is 26.55 kg/(m^2).    ECOG FS:1 - Symptomatic but completely ambulatory  General: Well-developed, well-nourished, no acute distress. Eyes: Pink conjunctiva, anicteric sclera. Lungs: Clear to auscultation bilaterally. Heart: Regular rate and rhythm. No rubs, murmurs, or gallops. Abdomen: Soft, nontender, nondistended. No organomegaly noted, normoactive bowel sounds. Musculoskeletal: No edema, cyanosis, or clubbing. Neuro: Alert, answering all questions appropriately. Cranial nerves grossly intact. Skin: No rashes or petechiae noted. Psych: Normal affect.    LAB RESULTS:  Lab Results  Component Value Date   NA 137 08/22/2015   K 3.8 08/22/2015   CL 101  08/22/2015   CO2 29 08/22/2015   GLUCOSE 268* 08/22/2015   BUN 11 08/22/2015   CREATININE 0.69 08/22/2015   CALCIUM 8.8* 08/22/2015   PROT 6.6 08/22/2015   ALBUMIN 3.3* 08/22/2015   AST 12* 08/22/2015   ALT 11* 08/22/2015   ALKPHOS 55 08/22/2015   BILITOT 0.4 08/22/2015   GFRNONAA >60 08/22/2015   GFRAA >60 08/22/2015    Lab Results  Component Value Date   WBC 4.9 08/22/2015   NEUTROABS 3.4 08/22/2015   HGB 11.0* 08/22/2015   HCT 34.0* 08/22/2015   MCV 79.0* 08/22/2015   PLT 223 08/22/2015     STUDIES: US Venous Img Lower Bilateral  08/22/2015  CLINICAL DATA:  Bilateral lower extremity pain and redness, history  of deep venous thrombosis EXAM: BILATERAL LOWER EXTREMITY VENOUS DOPPLER ULTRASOUND TECHNIQUE: Gray-scale sonography with graded compression, as well as color Doppler and duplex ultrasound were performed to evaluate the lower extremity deep venous systems from the level of the common femoral vein and including the common femoral, femoral, profunda femoral, popliteal and calf veins including the posterior tibial, peroneal and gastrocnemius veins when visible. The superficial great saphenous vein was also interrogated. Spectral Doppler was utilized to evaluate flow at rest and with distal augmentation maneuvers in the common femoral, femoral and popliteal veins. COMPARISON:  None. FINDINGS: RIGHT LOWER EXTREMITY Common Femoral Vein: Thrombus is identified, which is nonocclusive. The vein is compressible and shows phasic flow with augmentation. Saphenofemoral Junction: No evidence of thrombus. Normal compressibility and flow on color Doppler imaging. Profunda Femoral Vein: No evidence of thrombus. Normal compressibility and flow on color Doppler imaging. Femoral Vein: There is thrombus which is nonocclusive. The vein is not compressible. There is normal phasicity and augmentation. There is some calcification identified within the thrombus. Popliteal Vein: There is nonocclusive  thrombus. The vein is not compressible. There is normal phasicity and augmentation. Calf Veins: There is thrombus in the peroneal and posterior tibial vein. Thrombus is not occlusive. There is incomplete compressibility. LEFT LOWER EXTREMITY Common Femoral Vein: Nonocclusive thrombus with absence of compressibility. There is phasic flow. Saphenofemoral Junction: No evidence of thrombus. Normal compressibility and flow on color Doppler imaging. Profunda Femoral Vein: There is nonocclusive thrombus. Femoral Vein: Nonocclusive thrombus with noncompressible vein. Phasic flow identified. Popliteal Vein: Nonocclusive thrombus with absence of compressibility. There is phasic flow. Calf Veins: Posterior tibial vein demonstrates nonocclusive thrombus and absence of compressibility. Perineal vein show similar appearance. IMPRESSION: Bilateral deep venous thrombosis. The appearance on the right suggests the possibility of new thrombus superimposed on subacute to chronic thrombus. Thrombus on the left appears to be acute. These results will be called to the ordering clinician or representative by the Radiology Department at the imaging location. Patient is returning to referring physician's office for an appointment at 1330 hours. Electronically Signed   By: Skipper Cliche M.D.   On: 08/22/2015 13:20    ASSESSMENT: Stage IV adenocarcinoma the pancreas.  PLAN:    1. Pancreatic cancer: Patient's imaging and pathology results from [facility] reviewed independently confirming stage IV disease.  PET scan results also reviewed independently and reported as with likely metastatic disease in patient's liver. Patient's most recent CA-19-9 is 378 today.  Proceed with cycle 3, day 1 of gemcitabine and Abraxane. Patient will receive this regimen on days 1, 8, and 15 with day 22 off. Will plan to reimage after 3 cycles. Return to clinic in 1 week for consideration of cycle 3, day 8. 2. Thrombocytopenia: Secondary to  chemotherapy.  Monitor. Proceed with treatment as above. 3. Anemia: Mild, monitor. 4. Hyperglycemia:  Continue diabetic medications as prescribed.  Patient expressed understanding and was in agreement with this plan. He also understands that He can call clinic at any time with any questions, concerns, or complaints.   Lloyd Huger, MD   08/28/2015 7:32 AM

## 2015-08-30 ENCOUNTER — Inpatient Hospital Stay: Payer: PPO

## 2015-08-30 ENCOUNTER — Inpatient Hospital Stay (HOSPITAL_BASED_OUTPATIENT_CLINIC_OR_DEPARTMENT_OTHER): Payer: PPO | Admitting: Family Medicine

## 2015-08-30 VITALS — BP 127/71 | HR 70 | Temp 96.3°F | Resp 18 | Wt 166.7 lb

## 2015-08-30 DIAGNOSIS — R110 Nausea: Secondary | ICD-10-CM

## 2015-08-30 DIAGNOSIS — C787 Secondary malignant neoplasm of liver and intrahepatic bile duct: Secondary | ICD-10-CM

## 2015-08-30 DIAGNOSIS — R5381 Other malaise: Secondary | ICD-10-CM

## 2015-08-30 DIAGNOSIS — Z7982 Long term (current) use of aspirin: Secondary | ICD-10-CM

## 2015-08-30 DIAGNOSIS — R5383 Other fatigue: Secondary | ICD-10-CM

## 2015-08-30 DIAGNOSIS — C259 Malignant neoplasm of pancreas, unspecified: Secondary | ICD-10-CM

## 2015-08-30 DIAGNOSIS — I82403 Acute embolism and thrombosis of unspecified deep veins of lower extremity, bilateral: Secondary | ICD-10-CM

## 2015-08-30 DIAGNOSIS — D649 Anemia, unspecified: Secondary | ICD-10-CM

## 2015-08-30 DIAGNOSIS — E1165 Type 2 diabetes mellitus with hyperglycemia: Secondary | ICD-10-CM

## 2015-08-30 DIAGNOSIS — R600 Localized edema: Secondary | ICD-10-CM

## 2015-08-30 DIAGNOSIS — Z7901 Long term (current) use of anticoagulants: Secondary | ICD-10-CM | POA: Diagnosis not present

## 2015-08-30 DIAGNOSIS — Z5111 Encounter for antineoplastic chemotherapy: Secondary | ICD-10-CM | POA: Diagnosis not present

## 2015-08-30 DIAGNOSIS — Z87891 Personal history of nicotine dependence: Secondary | ICD-10-CM

## 2015-08-30 LAB — CBC WITH DIFFERENTIAL/PLATELET
Basophils Absolute: 0 10*3/uL (ref 0–0.1)
Basophils Relative: 1 %
EOS ABS: 0.1 10*3/uL (ref 0–0.7)
EOS PCT: 1 %
HCT: 32.3 % — ABNORMAL LOW (ref 40.0–52.0)
HEMOGLOBIN: 10.4 g/dL — AB (ref 13.0–18.0)
LYMPHS ABS: 1 10*3/uL (ref 1.0–3.6)
Lymphocytes Relative: 20 %
MCH: 25.9 pg — AB (ref 26.0–34.0)
MCHC: 32.4 g/dL (ref 32.0–36.0)
MCV: 80 fL (ref 80.0–100.0)
MONOS PCT: 9 %
Monocytes Absolute: 0.5 10*3/uL (ref 0.2–1.0)
Neutro Abs: 3.5 10*3/uL (ref 1.4–6.5)
Neutrophils Relative %: 69 %
PLATELETS: 113 10*3/uL — AB (ref 150–440)
RBC: 4.03 MIL/uL — ABNORMAL LOW (ref 4.40–5.90)
RDW: 17.1 % — ABNORMAL HIGH (ref 11.5–14.5)
WBC: 5.1 10*3/uL (ref 3.8–10.6)

## 2015-08-30 LAB — COMPREHENSIVE METABOLIC PANEL
ALK PHOS: 51 U/L (ref 38–126)
ALT: 30 U/L (ref 17–63)
ANION GAP: 7 (ref 5–15)
AST: 29 U/L (ref 15–41)
Albumin: 3.1 g/dL — ABNORMAL LOW (ref 3.5–5.0)
BUN: 14 mg/dL (ref 6–20)
CALCIUM: 8.6 mg/dL — AB (ref 8.9–10.3)
CO2: 26 mmol/L (ref 22–32)
Chloride: 107 mmol/L (ref 101–111)
Creatinine, Ser: 0.7 mg/dL (ref 0.61–1.24)
Glucose, Bld: 253 mg/dL — ABNORMAL HIGH (ref 65–99)
Potassium: 3.2 mmol/L — ABNORMAL LOW (ref 3.5–5.1)
SODIUM: 140 mmol/L (ref 135–145)
Total Bilirubin: 0.2 mg/dL — ABNORMAL LOW (ref 0.3–1.2)
Total Protein: 6 g/dL — ABNORMAL LOW (ref 6.5–8.1)

## 2015-08-30 LAB — PROTIME-INR
INR: 1.38
Prothrombin Time: 17.1 seconds — ABNORMAL HIGH (ref 11.4–15.0)

## 2015-08-30 MED ORDER — HEPARIN SOD (PORK) LOCK FLUSH 100 UNIT/ML IV SOLN
500.0000 [IU] | Freq: Once | INTRAVENOUS | Status: AC | PRN
  Administered 2015-08-30: 500 [IU]
  Filled 2015-08-30: qty 5

## 2015-08-30 MED ORDER — SODIUM CHLORIDE 0.9 % IJ SOLN
10.0000 mL | INTRAMUSCULAR | Status: DC | PRN
  Administered 2015-08-30: 10 mL
  Filled 2015-08-30: qty 10

## 2015-08-30 MED ORDER — PACLITAXEL PROTEIN-BOUND CHEMO INJECTION 100 MG
125.0000 mg/m2 | Freq: Once | INTRAVENOUS | Status: AC
  Administered 2015-08-30: 250 mg via INTRAVENOUS
  Filled 2015-08-30: qty 50

## 2015-08-30 MED ORDER — GEMCITABINE HCL CHEMO INJECTION 1 GM/26.3ML
2000.0000 mg | Freq: Once | INTRAVENOUS | Status: AC
Start: 2015-08-30 — End: 2015-08-30
  Administered 2015-08-30: 2000 mg via INTRAVENOUS
  Filled 2015-08-30: qty 52.6

## 2015-08-30 MED ORDER — SODIUM CHLORIDE 0.9 % IV SOLN
Freq: Once | INTRAVENOUS | Status: AC
  Administered 2015-08-30: 11:00:00 via INTRAVENOUS
  Filled 2015-08-30: qty 1000

## 2015-08-30 MED ORDER — SODIUM CHLORIDE 0.9 % IV SOLN
Freq: Once | INTRAVENOUS | Status: AC
  Administered 2015-08-30: 11:00:00 via INTRAVENOUS
  Filled 2015-08-30: qty 4

## 2015-08-30 NOTE — Progress Notes (Addendum)
[place]  [phone]) [phone] Fax:(336) [phone]  ID: Ronnie Johnson OB: 1941-03-04  MR#: [id number]  [id number]  Patient Care Team: Tonia Ghent, MD as PCP - General (Family Medicine) Hessie Knows, MD as Referring Physician (Orthopedic Surgery) Clent Jacks, RN as Registered Nurse  CHIEF COMPLAINT:  Chief Complaint  Patient presents with  . Pancreatic Cancer    INTERVAL HISTORY: Patient returns to clinic today for continuation of chemotherapy for pancreatic cancer with liver metastasis. He is scheduled for cycle 3, day 15 of gemcitabine and Abraxane.  Patient was recently diagnosed with bilateral DVTs and has been started on Coumadin. He is currently taking 5 mg daily. He denies any fevers.  He denies any chest pain or shortness of breath. He has occasional nausea, but denies any vomiting, constipation, or diarrhea. He has no urinary complaints. Patient offers no further specific complaints today.  REVIEW OF SYSTEMS:   Review of Systems  Constitutional: Positive for malaise/fatigue. Negative for fever, chills and weight loss.  HENT: Negative for congestion and sore throat.   Eyes: Negative for pain, discharge and redness.  Respiratory: Negative for cough, shortness of breath and wheezing.   Cardiovascular: Positive for leg swelling.  Gastrointestinal: Positive for nausea. Negative for vomiting, abdominal pain, diarrhea and constipation.  Genitourinary: Negative for urgency, frequency and hematuria.  Musculoskeletal: Negative.   Skin: Negative for itching and rash.  Neurological: Negative for dizziness, tingling, focal weakness and weakness.  Endo/Heme/Allergies: Does not bruise/bleed easily.    As per HPI. Otherwise, a complete review of systems is negatve.  PAST MEDICAL HISTORY: Past Medical History  Diagnosis Date  . Hypertension   . DVT of leg (deep venous thrombosis) 2009  . Nephrolithiasis   . Hyperlipidemia   . Allergic  rhinitis   . CAD (coronary artery disease)   . Urethral diverticulum   . Varicose vein of leg   . Dupuytren's contracture   . ED (erectile dysfunction)   . History of colonic polyps   . GERD (gastroesophageal reflux disease)   . Diverticulosis of colon   . Myocardial infarction ([place])   . DVT (deep venous thrombosis)   . Polio 1948    was hospitalized for 1 year  . Diabetes mellitus, type 2     Patient takes Metformin.  . SCC (squamous cell carcinoma)     scalp 2013 per derm    PAST SURGICAL HISTORY: Past Surgical History  Procedure Laterality Date  . Appendectomy  1994  . Umbilical hernia repair  03/14/2005  . 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 Replacement total knee Left 2016  . Peripheral vascular catheterization N/A 06/23/2015    Procedure: [name];  Surgeon: Algernon Huxley, MD;  Location: [facility];  Service: Cardiovascular;  Laterality: N/A;    FAMILY HISTORY Family History  Problem Relation Age of Onset  . Diabetes Mother   . Hypertension Mother   . Stroke Mother   . Alzheimer's disease Mother   . Heart attack Father   . Hypertension Father   . Prostate cancer Brother   . Diabetes Brother   . Hypertension Brother   . Diabetes Sister   . Colon cancer Neg Hx   . Hypertension Daughter        ADVANCED DIRECTIVES:    HEALTH MAINTENANCE: Social History  Substance Use Topics  . Smoking status: Former Smoker    Quit date: 10/08/1988  . Smokeless tobacco:  Never Used  . Alcohol Use: 0.0 oz/week    0 Standard drinks or equivalent per week     Comment: RARE     Colonoscopy:  PAP:  Bone density:  Lipid panel:  Allergies  Allergen Reactions  . Glimepiride Other (See Comments)    Leg stiffness  . Lipitor [Atorvastatin Calcium]     Tolerates crestor  . Oxycodone Other (See Comments)    Altered mental state  . Tape   . Latex Rash    Current Outpatient Prescriptions  Medication Sig Dispense Refill  . carvedilol  (COREG) 6.25 MG tablet Take 1 tablet (6.25 mg total) by mouth 2 (two) times daily with a meal. 180 tablet 3  . esomeprazole (NEXIUM) 20 MG capsule Take 20 mg by mouth daily as needed (HEARTBURN).    . Insulin Glargine (LANTUS SOLOSTAR) 100 UNIT/ML Solostar Pen Inject 5 units once a day.  Can add 1 unit per day if AM sugar is >150. 5 pen PRN  . lidocaine-prilocaine (EMLA) cream Apply 1 application topically as needed. Apply to port 1 hour prior to chemotherapy appointment, cover with plastic wrap. 30 g 2  . lisinopril (PRINIVIL,ZESTRIL) 10 MG tablet Take 1 tablet (10 mg total) by mouth daily. 90 tablet 3  . metFORMIN (GLUCOPHAGE) 1000 MG tablet Take up to 1000mg  in the AM and 500mg  in the PM.  If GI upset, cut back to 1000mg  a day total.    . prochlorperazine (COMPAZINE) 10 MG tablet Take 1 tablet (10 mg total) by mouth every 6 (six) hours as needed for nausea or vomiting. 30 tablet 2  . rosuvastatin (CRESTOR) 20 MG tablet Take 0.5 tablets (10 mg total) by mouth daily.    . sitaGLIPtin (JANUVIA) 100 MG tablet Take 1 tablet (100 mg total) by mouth daily. 30 tablet 12  . warfarin (COUMADIN) 5 MG tablet Take 1 tablet (5 mg total) by mouth daily. 30 tablet 5   No current facility-administered medications for this visit.   Facility-Administered Medications Ordered in Other Visits  Medication Dose Route Frequency Provider Last Rate Last Dose  . heparin lock flush 100 unit/mL  500 Units Intracatheter Once PRN Lloyd Huger, MD      . sodium chloride 0.9 % injection 10 mL  10 mL Intracatheter PRN Lloyd Huger, MD   10 mL at 08/30/15 0940    OBJECTIVE: Filed Vitals:   08/30/15 1000  BP: 127/71  Pulse: 70  Temp: 96.3 F (35.7 C)  Resp: 18     Body mass index is 26.1 kg/(m^2).    ECOG FS:1 - Symptomatic but completely ambulatory  General: Well-developed, well-nourished, no acute distress. Eyes: Pink conjunctiva, anicteric sclera. Lungs: Clear to auscultation bilaterally. Heart: Regular  rate and rhythm. No rubs, murmurs, or gallops. Abdomen: Soft, nontender, nondistended. No organomegaly noted, normoactive bowel sounds. Musculoskeletal: Trace lower extremity edema, mild erythema of bilateral ankles. Neuro: Alert, answering all questions appropriately. Cranial nerves grossly intact. Skin: No rashes or petechiae noted. Psych: Normal affect.    LAB RESULTS:  Lab Results  Component Value Date   NA 137 08/22/2015   K 3.8 08/22/2015   CL 101 08/22/2015   CO2 29 08/22/2015   GLUCOSE 268* 08/22/2015   BUN 11 08/22/2015   CREATININE 0.69 08/22/2015   CALCIUM 8.8* 08/22/2015   PROT 6.6 08/22/2015   ALBUMIN 3.3* 08/22/2015   AST 12* 08/22/2015   ALT 11* 08/22/2015   ALKPHOS 55 08/22/2015   BILITOT 0.4 08/22/2015  GFRNONAA >60 08/22/2015   GFRAA >60 08/22/2015    Lab Results  Component Value Date   WBC 5.1 08/30/2015   NEUTROABS 3.5 08/30/2015   HGB 10.4* 08/30/2015   HCT 32.3* 08/30/2015   MCV 80.0 08/30/2015   PLT 113* 08/30/2015     STUDIES: US Venous Img Lower Bilateral  08/22/2015  CLINICAL DATA:  Bilateral lower extremity pain and redness, history of deep venous thrombosis EXAM: BILATERAL LOWER EXTREMITY VENOUS DOPPLER ULTRASOUND TECHNIQUE: Gray-scale sonography with graded compression, as well as color Doppler and duplex ultrasound were performed to evaluate the lower extremity deep venous systems from the level of the common femoral vein and including the common femoral, femoral, profunda femoral, popliteal and calf veins including the posterior tibial, peroneal and gastrocnemius veins when visible. The superficial great saphenous vein was also interrogated. Spectral Doppler was utilized to evaluate flow at rest and with distal augmentation maneuvers in the common femoral, femoral and popliteal veins. COMPARISON:  None. FINDINGS: RIGHT LOWER EXTREMITY Common Femoral Vein: Thrombus is identified, which is nonocclusive. The vein is compressible and shows  phasic flow with augmentation. Saphenofemoral Junction: No evidence of thrombus. Normal compressibility and flow on color Doppler imaging. Profunda Femoral Vein: No evidence of thrombus. Normal compressibility and flow on color Doppler imaging. Femoral Vein: There is thrombus which is nonocclusive. The vein is not compressible. There is normal phasicity and augmentation. There is some calcification identified within the thrombus. Popliteal Vein: There is nonocclusive thrombus. The vein is not compressible. There is normal phasicity and augmentation. Calf Veins: There is thrombus in the peroneal and posterior tibial vein. Thrombus is not occlusive. There is incomplete compressibility. LEFT LOWER EXTREMITY Common Femoral Vein: Nonocclusive thrombus with absence of compressibility. There is phasic flow. Saphenofemoral Junction: No evidence of thrombus. Normal compressibility and flow on color Doppler imaging. Profunda Femoral Vein: There is nonocclusive thrombus. Femoral Vein: Nonocclusive thrombus with noncompressible vein. Phasic flow identified. Popliteal Vein: Nonocclusive thrombus with absence of compressibility. There is phasic flow. Calf Veins: Posterior tibial vein demonstrates nonocclusive thrombus and absence of compressibility. Perineal vein show similar appearance. IMPRESSION: Bilateral deep venous thrombosis. The appearance on the right suggests the possibility of new thrombus superimposed on subacute to chronic thrombus. Thrombus on the left appears to be acute. These results will be called to the ordering clinician or representative by the Radiology Department at the imaging location. Patient is returning to referring physician's office for an appointment at 1330 hours. Electronically Signed   By: Skipper Cliche M.D.   On: 08/22/2015 13:20    ASSESSMENT: Stage IV adenocarcinoma the pancreas.  PLAN:    1. Pancreatic cancer: Patient's imaging and pathology results from [facility] reviewed  independently confirming stage IV disease.  PET scan results also reviewed and reported as with likely metastatic disease in patient's liver. Patient's most recent CA-19-9 is 397.  Proceed with cycle 3, day 15 of gemcitabine and Abraxane tomorrow. Patient will receive this regimen on days 1, 8, and 15 with day 22 off. As patient has completed 3 cycles of gemcitabine and Abraxane, we will schedule a PET scan for restaging of stage IV pancreatic cancer with liver metastasis.  2. Thrombocytopenia: Resolved., Monitor. 3. Anemia: Mild, monitor. 4. Hyperglycemia:  Continue diabetic medications as prescribed.  5. DVTs: Likely secondary to decreased activity and malignancy. Patient has requested to be placed on Coumadin rather than one of the newer anticoagulants. patient's INR on November 22 is 1.38, we will increase warfarin dose to 7.5  mg Monday Wednesday and Friday; and 5 mg every other day.  Will check INR at next clinic visit. Goal INR 2.0-3.0.  Patient expressed understanding and was in agreement with this plan. He also understands that He can call clinic at any time with any questions, concerns, or complaints.   Dr. Rudean Hitt was available for consultation and review of plan of care for this patient.  Evlyn Kanner, NP   08/30/2015 10:26 AM

## 2015-08-31 ENCOUNTER — Other Ambulatory Visit: Payer: Self-pay | Admitting: Family Medicine

## 2015-08-31 MED ORDER — WARFARIN SODIUM 5 MG PO TABS: ORAL_TABLET | ORAL | Status: DC

## 2015-09-05 ENCOUNTER — Ambulatory Visit: Payer: PPO

## 2015-09-05 ENCOUNTER — Ambulatory Visit (INDEPENDENT_AMBULATORY_CARE_PROVIDER_SITE_OTHER): Payer: PPO | Admitting: *Deleted

## 2015-09-05 ENCOUNTER — Ambulatory Visit (INDEPENDENT_AMBULATORY_CARE_PROVIDER_SITE_OTHER): Payer: PPO | Admitting: Family Medicine

## 2015-09-05 ENCOUNTER — Encounter: Payer: Self-pay | Admitting: Family Medicine

## 2015-09-05 VITALS — BP 122/56 | HR 82 | Temp 97.4°F

## 2015-09-05 DIAGNOSIS — IMO0001 Reserved for inherently not codable concepts without codable children: Secondary | ICD-10-CM

## 2015-09-05 DIAGNOSIS — M609 Myositis, unspecified: Secondary | ICD-10-CM | POA: Diagnosis not present

## 2015-09-05 DIAGNOSIS — Z5181 Encounter for therapeutic drug level monitoring: Secondary | ICD-10-CM | POA: Diagnosis not present

## 2015-09-05 DIAGNOSIS — M791 Myalgia: Secondary | ICD-10-CM | POA: Diagnosis not present

## 2015-09-05 DIAGNOSIS — I82403 Acute embolism and thrombosis of unspecified deep veins of lower extremity, bilateral: Secondary | ICD-10-CM | POA: Diagnosis not present

## 2015-09-05 LAB — BASIC METABOLIC PANEL
BUN: 12 mg/dL (ref 6–23)
CALCIUM: 9.1 mg/dL (ref 8.4–10.5)
CO2: 30 mEq/L (ref 19–32)
CREATININE: 0.72 mg/dL (ref 0.40–1.50)
Chloride: 104 mEq/L (ref 96–112)
GFR: 113.57 mL/min (ref >60.00)
GLUCOSE: 173 mg/dL — AB (ref 70–99)
Potassium: 4 mEq/L (ref 3.5–5.1)
Sodium: 142 mEq/L (ref 135–145)

## 2015-09-05 LAB — POCT INR: INR: 1.8

## 2015-09-05 LAB — CK: Total CK: 59 U/L (ref 7–232)

## 2015-09-05 MED ORDER — ROSUVASTATIN CALCIUM 20 MG PO TABS: ORAL_TABLET | ORAL | Status: DC

## 2015-09-05 NOTE — Patient Instructions (Signed)
Likely strained muscles.  Possibly worsened by the crestor.  Would stop crestor for now.  Go to the lab on the way out.  We'll contact you with your lab report. Update me in about 2 days.   Take care.  Glad to see you.

## 2015-09-05 NOTE — Progress Notes (Signed)
Pre visit review using our clinic review tool, if applicable. No additional management support is needed unless otherwise documented below in the visit note.  B quads and B shins with pain for a few days.  Tender to touch.  No trauma. Known DVT, on treatment.  No bruising.  Didn't start with prev treatment.   On statin.   He had a lot of people over to his house this weekend and he was up a lot more than usual getting ready for that.   No fevers.  Swelling in his legs continues.  Still on coumadin.   PET pending for later this week, between chemo cycles at this point.    Meds, vitals, and allergies reviewed.   ROS: See HPI.  Otherwise, noncontributory.  nad ncat Mmm Neck supple, no LA rrr ctab abd soft, not ttp Ext with trace BLE edema.  B quads and B shins ttp but w/o rash or bruising.

## 2015-09-05 NOTE — Progress Notes (Signed)
Pre visit review using our clinic review tool, if applicable. No additional management support is needed unless otherwise documented below in the visit note. 

## 2015-09-06 ENCOUNTER — Ambulatory Visit (INDEPENDENT_AMBULATORY_CARE_PROVIDER_SITE_OTHER): Payer: PPO | Admitting: Podiatry

## 2015-09-06 ENCOUNTER — Encounter: Payer: Self-pay | Admitting: Podiatry

## 2015-09-06 DIAGNOSIS — B351 Tinea unguium: Secondary | ICD-10-CM

## 2015-09-06 DIAGNOSIS — M79676 Pain in unspecified toe(s): Secondary | ICD-10-CM

## 2015-09-06 DIAGNOSIS — IMO0001 Reserved for inherently not codable concepts without codable children: Secondary | ICD-10-CM | POA: Insufficient documentation

## 2015-09-06 NOTE — Progress Notes (Signed)
Subjective: 74 y.o.-year-old male returns the office today for painful, elongated, thickened toenails which he is unable to trim himself. Denies any redness or drainage around the nails. Denies any acute changes since last appointment and no new complaints today. Denies any systemic complaints such as fevers, chills, nausea, vomiting. States that he is recently been diagnosed with pancreatic cancer and liver metastases. He is also on coumadin for DVT.   Objective: AAO 3, NAD DP/PT pulses palpable, CRT less than 3 seconds Protective sensation intact with Simms Weinstein monofilament Nails hypertrophic, dystrophic, elongated, brittle, discolored 10. There is tenderness overlying the nails 1-5 bilaterally. There is no surrounding erythema or drainage along the nail sites. No open lesions or pre-ulcerative lesions are identified. No other areas of tenderness bilateral lower extremities. No overlying edema, erythema, increased warmth. No pain with calf compression, swelling, warmth, erythema.  Assessment: Patient presents with symptomatic onychomycosis. 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  Plan: -Treatment options including alternatives, risks, complications were discussed -Nails sharply debrided 10 without complication/bleeding. -Discussed daily foot inspection. If there are any changes, to call the office immediately.  -Follow-up in 3 months or sooner if any problems are to arise. In the meantime, encouraged to call the office with any questions, concerns, changes symptoms.   Celesta Gentile, DPM

## 2015-09-06 NOTE — Assessment & Plan Note (Signed)
Could be from relative over use and/ot statin use given weight loss and other comorbid conditions.  D/w pt.  See notes on labs . Hold statin and update me in a few days.  He agrees. No sign of ominous process.  Okay for outpatient f/u.  Already anticoagulated but I don't suspect this to be related to DVT.

## 2015-09-06 NOTE — Patient Instructions (Signed)
Over the counter treatment for nail fungus is called fungi-nail  

## 2015-09-08 ENCOUNTER — Telehealth: Payer: Self-pay | Admitting: *Deleted

## 2015-09-08 ENCOUNTER — Ambulatory Visit
Admission: RE | Admit: 2015-09-08 | Discharge: 2015-09-08 | Disposition: A | Discharge status: Home / Self Care | Payer: PPO | Source: Ambulatory Visit | Attending: Family Medicine | Admitting: Family Medicine

## 2015-09-08 ENCOUNTER — Telehealth: Payer: Self-pay

## 2015-09-08 DIAGNOSIS — C787 Secondary malignant neoplasm of liver and intrahepatic bile duct: Principal | ICD-10-CM

## 2015-09-08 DIAGNOSIS — C259 Malignant neoplasm of pancreas, unspecified: Secondary | ICD-10-CM

## 2015-09-08 NOTE — Telephone Encounter (Signed)
Was unable to perform PET this morning due to patient eating. Has been reschdeuled for Wednesday 12/7 @ 8:30

## 2015-09-08 NOTE — Telephone Encounter (Signed)
  Oncology Nurse Navigator Documentation    Navigator Encounter Type: Telephone (09/08/15 1600)                      Time Spent with Patient: 60 (09/08/15 1600)   Received call from Mrs Bras regarding PET appt being changed due to him eating toast at 8am and his PET was at 12:30. She states that her instructions did not mention what types of food he can eat for the breakfast. She was very upset because PET was rescheduled for the day before his chemo next week which makes his glucose go above the acceptable level for PET. Scheduler was able to get PET changed to 09/08/15 with arrival at 10:00. Thoroughly went over prep for diabetics with read back from her.

## 2015-09-09 ENCOUNTER — Telehealth: Payer: Self-pay | Admitting: Family Medicine

## 2015-09-09 ENCOUNTER — Encounter
Admission: RE | Admit: 2015-09-09 | Discharge: 2015-09-09 | Disposition: A | Discharge status: Home / Self Care | Payer: PPO | Source: Ambulatory Visit | Attending: Family Medicine | Admitting: Family Medicine

## 2015-09-09 DIAGNOSIS — C259 Malignant neoplasm of pancreas, unspecified: Secondary | ICD-10-CM | POA: Diagnosis present

## 2015-09-09 DIAGNOSIS — C787 Secondary malignant neoplasm of liver and intrahepatic bile duct: Secondary | ICD-10-CM | POA: Insufficient documentation

## 2015-09-09 LAB — GLUCOSE, CAPILLARY: Glucose-Capillary: 99 mg/dL (ref 65–99)

## 2015-09-09 MED ORDER — FLUDEOXYGLUCOSE F - 18 (FDG) INJECTION
13.1300 | Freq: Once | INTRAVENOUS | Status: AC | PRN
  Administered 2015-09-09: 13.13 via INTRAVENOUS

## 2015-09-09 NOTE — Telephone Encounter (Signed)
Thanks

## 2015-09-09 NOTE — Telephone Encounter (Signed)
Patient says the pain is worse.  It doesn't hurt so much in the morning but gets worse as the day progresses.  They are at home now.  Patient's friend that he was going to see at [facility] passed away before they could get there.

## 2015-09-09 NOTE — Telephone Encounter (Signed)
[place] Patient Name: Ronnie Johnson DOB: 08-01-1941 Initial Comment Caller states her husband was seen on Monday- He's having horrible leg pain, he is on chemo. Nurse Assessment Nurse: Markus Daft, RN, Sherre Poot Date/Time (Eastern Time): 09/09/2015 10:16:17 AM Confirm and document reason for call. If symptomatic, describe symptoms. ---Caller states her husband was seen on Monday for severe leg pain - above the knees in both legs. Still sore and painful. He is being seen now for PET scan to start at 10:30 am. Then they have to leave directly to go to [facility]. What does MD want him to do about this pain? Denies new or worsening pain (he wasn't available to ask as he is having the PET scan right now). -- Diagnosed 2 wks ago with DVT's in both legs as diagnosed by CT scan. He had Lovenox and Coumadin. He is on chemo for Pancreatic CA with mets to liver. Last treatment was 08/30/2015, Tuesday. Has the patient traveled out of the country within the last 30 days? ---Not Applicable Does the patient have any new or worsening symptoms? ---No Please document clinical information provided and list any resource used. ---RN advised to have him call back if any new or worsening s/ s. Also, 4th day off of Crestor. Wife would like a call back. RN will pass this info. on to the doctor thru the EMR. Caller verb. understanding. Guidelines Guideline Title Affirmed Question Affirmed Notes Final Disposition User Clinical Call [place], RN, [employer]

## 2015-09-09 NOTE — Telephone Encounter (Signed)
It could be swelling later in the day (from the DVT) and the more he is upright, then the worse the pain would be.  I would elevated legs as much as possible over the weekend, stay off his feet as much as possible for now.  Still stay off the statin for now and update [place] Monday if not better.  If really worse in the meantime, or if spreading redness/etc, then needs eval over the weekend.  Thanks.

## 2015-09-09 NOTE — Telephone Encounter (Signed)
Left detailed message on voicemail.  

## 2015-09-09 NOTE — Telephone Encounter (Signed)
If on the way to [place], then I would get through that, continue off the statin for now.   Is the pain still getting worse? If so, need eval in the meantime.  Thanks.

## 2015-09-12 ENCOUNTER — Telehealth: Payer: Self-pay

## 2015-09-12 ENCOUNTER — Other Ambulatory Visit: Payer: Self-pay | Admitting: *Deleted

## 2015-09-12 ENCOUNTER — Inpatient Hospital Stay: Payer: PPO | Attending: Oncology

## 2015-09-12 DIAGNOSIS — M79651 Pain in right thigh: Secondary | ICD-10-CM | POA: Diagnosis not present

## 2015-09-12 DIAGNOSIS — E785 Hyperlipidemia, unspecified: Secondary | ICD-10-CM | POA: Insufficient documentation

## 2015-09-12 DIAGNOSIS — K219 Gastro-esophageal reflux disease without esophagitis: Secondary | ICD-10-CM | POA: Diagnosis not present

## 2015-09-12 DIAGNOSIS — C259 Malignant neoplasm of pancreas, unspecified: Secondary | ICD-10-CM

## 2015-09-12 DIAGNOSIS — E1165 Type 2 diabetes mellitus with hyperglycemia: Secondary | ICD-10-CM | POA: Insufficient documentation

## 2015-09-12 DIAGNOSIS — C787 Secondary malignant neoplasm of liver and intrahepatic bile duct: Principal | ICD-10-CM

## 2015-09-12 DIAGNOSIS — Z5111 Encounter for antineoplastic chemotherapy: Secondary | ICD-10-CM | POA: Insufficient documentation

## 2015-09-12 DIAGNOSIS — R531 Weakness: Secondary | ICD-10-CM | POA: Diagnosis not present

## 2015-09-12 DIAGNOSIS — I2510 Atherosclerotic heart disease of native coronary artery without angina pectoris: Secondary | ICD-10-CM | POA: Diagnosis not present

## 2015-09-12 DIAGNOSIS — Z8042 Family history of malignant neoplasm of prostate: Secondary | ICD-10-CM | POA: Insufficient documentation

## 2015-09-12 DIAGNOSIS — T451X5S Adverse effect of antineoplastic and immunosuppressive drugs, sequela: Secondary | ICD-10-CM | POA: Insufficient documentation

## 2015-09-12 DIAGNOSIS — I10 Essential (primary) hypertension: Secondary | ICD-10-CM | POA: Insufficient documentation

## 2015-09-12 DIAGNOSIS — R5383 Other fatigue: Secondary | ICD-10-CM | POA: Insufficient documentation

## 2015-09-12 DIAGNOSIS — Z79899 Other long term (current) drug therapy: Secondary | ICD-10-CM | POA: Diagnosis not present

## 2015-09-12 DIAGNOSIS — M79652 Pain in left thigh: Secondary | ICD-10-CM | POA: Insufficient documentation

## 2015-09-12 DIAGNOSIS — Z7901 Long term (current) use of anticoagulants: Secondary | ICD-10-CM | POA: Diagnosis not present

## 2015-09-12 DIAGNOSIS — R110 Nausea: Secondary | ICD-10-CM | POA: Diagnosis not present

## 2015-09-12 DIAGNOSIS — R590 Localized enlarged lymph nodes: Secondary | ICD-10-CM | POA: Diagnosis not present

## 2015-09-12 DIAGNOSIS — Z87891 Personal history of nicotine dependence: Secondary | ICD-10-CM | POA: Insufficient documentation

## 2015-09-12 DIAGNOSIS — D6959 Other secondary thrombocytopenia: Secondary | ICD-10-CM | POA: Insufficient documentation

## 2015-09-12 DIAGNOSIS — R5381 Other malaise: Secondary | ICD-10-CM | POA: Diagnosis not present

## 2015-09-12 DIAGNOSIS — Z794 Long term (current) use of insulin: Secondary | ICD-10-CM | POA: Diagnosis not present

## 2015-09-12 DIAGNOSIS — Z86010 Personal history of colonic polyps: Secondary | ICD-10-CM | POA: Diagnosis not present

## 2015-09-12 DIAGNOSIS — I252 Old myocardial infarction: Secondary | ICD-10-CM | POA: Insufficient documentation

## 2015-09-12 DIAGNOSIS — Z87442 Personal history of urinary calculi: Secondary | ICD-10-CM | POA: Insufficient documentation

## 2015-09-12 DIAGNOSIS — Z800 Family history of malignant neoplasm of digestive organs: Secondary | ICD-10-CM | POA: Diagnosis not present

## 2015-09-12 DIAGNOSIS — D649 Anemia, unspecified: Secondary | ICD-10-CM | POA: Insufficient documentation

## 2015-09-12 DIAGNOSIS — Z7984 Long term (current) use of oral hypoglycemic drugs: Secondary | ICD-10-CM | POA: Diagnosis not present

## 2015-09-12 DIAGNOSIS — I82403 Acute embolism and thrombosis of unspecified deep veins of lower extremity, bilateral: Secondary | ICD-10-CM

## 2015-09-12 DIAGNOSIS — Z86718 Personal history of other venous thrombosis and embolism: Secondary | ICD-10-CM | POA: Diagnosis not present

## 2015-09-12 LAB — COMPREHENSIVE METABOLIC PANEL
ALT: 12 U/L — AB (ref 17–63)
AST: 11 U/L — AB (ref 15–41)
Albumin: 3.5 g/dL (ref 3.5–5.0)
Alkaline Phosphatase: 57 U/L (ref 38–126)
Anion gap: 5 (ref 5–15)
BUN: 10 mg/dL (ref 6–20)
CHLORIDE: 108 mmol/L (ref 101–111)
CO2: 29 mmol/L (ref 22–32)
CREATININE: 0.78 mg/dL (ref 0.61–1.24)
Calcium: 9.2 mg/dL (ref 8.9–10.3)
GFR calc non Af Amer: >60 mL/min (ref >60)
Glucose, Bld: 255 mg/dL — ABNORMAL HIGH (ref 65–99)
POTASSIUM: 4.3 mmol/L (ref 3.5–5.1)
SODIUM: 142 mmol/L (ref 135–145)
Total Bilirubin: 0.1 mg/dL — ABNORMAL LOW (ref 0.3–1.2)
Total Protein: 6.7 g/dL (ref 6.5–8.1)

## 2015-09-12 LAB — CBC WITH DIFFERENTIAL/PLATELET
BASOS ABS: 0 10*3/uL (ref 0–0.1)
Basophils Relative: 1 %
EOS ABS: 0.1 10*3/uL (ref 0–0.7)
EOS PCT: 2 %
HCT: 35.3 % — ABNORMAL LOW (ref 40.0–52.0)
Hemoglobin: 11.4 g/dL — ABNORMAL LOW (ref 13.0–18.0)
Lymphocytes Relative: 16 %
Lymphs Abs: 0.9 10*3/uL — ABNORMAL LOW (ref 1.0–3.6)
MCH: 26.1 pg (ref 26.0–34.0)
MCHC: 32.2 g/dL (ref 32.0–36.0)
MCV: 81.3 fL (ref 80.0–100.0)
MONO ABS: 0.7 10*3/uL (ref 0.2–1.0)
Monocytes Relative: 11 %
Neutro Abs: 4.2 10*3/uL (ref 1.4–6.5)
Neutrophils Relative %: 70 %
PLATELETS: 284 10*3/uL (ref 150–440)
RBC: 4.35 MIL/uL — AB (ref 4.40–5.90)
RDW: 19.7 % — AB (ref 11.5–14.5)
WBC: 5.9 10*3/uL (ref 3.8–10.6)

## 2015-09-12 LAB — PROTIME-INR
INR: 1.27
PROTHROMBIN TIME: 16 s — AB (ref 11.4–15.0)

## 2015-09-12 NOTE — Telephone Encounter (Signed)
  Oncology Nurse Navigator Documentation    Navigator Encounter Type: Telephone (09/12/15 0900)                      Time Spent with Patient: 30 (09/12/15 0900)   Received call from Ronnie Johnson. Ronnie Johnson needs to be out of infusion by 12:30 12/6 to attend a funeral that he is participating in. After talking with Tillie Rung and Lattie Haw, Ronnie Johnson will come today for labs and arrive at [facility] 12/6 and go straight to infusion. Dr Grayland Ormond will see him in the infusion center. He is agreeable to this plan.

## 2015-09-13 ENCOUNTER — Inpatient Hospital Stay: Payer: PPO

## 2015-09-13 ENCOUNTER — Inpatient Hospital Stay (HOSPITAL_BASED_OUTPATIENT_CLINIC_OR_DEPARTMENT_OTHER): Payer: PPO | Admitting: Oncology

## 2015-09-13 ENCOUNTER — Inpatient Hospital Stay: Payer: PPO | Admitting: Oncology

## 2015-09-13 VITALS — BP 147/77 | HR 76 | Temp 96.4°F | Resp 16

## 2015-09-13 DIAGNOSIS — R590 Localized enlarged lymph nodes: Secondary | ICD-10-CM | POA: Diagnosis not present

## 2015-09-13 DIAGNOSIS — R110 Nausea: Secondary | ICD-10-CM

## 2015-09-13 DIAGNOSIS — C787 Secondary malignant neoplasm of liver and intrahepatic bile duct: Secondary | ICD-10-CM | POA: Diagnosis not present

## 2015-09-13 DIAGNOSIS — Z86718 Personal history of other venous thrombosis and embolism: Secondary | ICD-10-CM

## 2015-09-13 DIAGNOSIS — R5383 Other fatigue: Secondary | ICD-10-CM

## 2015-09-13 DIAGNOSIS — R5381 Other malaise: Secondary | ICD-10-CM

## 2015-09-13 DIAGNOSIS — D649 Anemia, unspecified: Secondary | ICD-10-CM

## 2015-09-13 DIAGNOSIS — Z7901 Long term (current) use of anticoagulants: Secondary | ICD-10-CM

## 2015-09-13 DIAGNOSIS — C259 Malignant neoplasm of pancreas, unspecified: Secondary | ICD-10-CM | POA: Diagnosis not present

## 2015-09-13 DIAGNOSIS — Z5111 Encounter for antineoplastic chemotherapy: Secondary | ICD-10-CM | POA: Diagnosis not present

## 2015-09-13 DIAGNOSIS — E1165 Type 2 diabetes mellitus with hyperglycemia: Secondary | ICD-10-CM

## 2015-09-13 DIAGNOSIS — E785 Hyperlipidemia, unspecified: Secondary | ICD-10-CM

## 2015-09-13 DIAGNOSIS — I2510 Atherosclerotic heart disease of native coronary artery without angina pectoris: Secondary | ICD-10-CM

## 2015-09-13 LAB — CANCER ANTIGEN 19-9: CA 19 9: 421 U/mL — AB (ref 0–35)

## 2015-09-13 MED ORDER — HEPARIN SOD (PORK) LOCK FLUSH 100 UNIT/ML IV SOLN
500.0000 [IU] | Freq: Once | INTRAVENOUS | Status: AC | PRN
  Administered 2015-09-13: 500 [IU]
  Filled 2015-09-13: qty 5

## 2015-09-13 MED ORDER — SODIUM CHLORIDE 0.9 % IJ SOLN
10.0000 mL | INTRAMUSCULAR | Status: DC | PRN
  Filled 2015-09-13: qty 10

## 2015-09-13 MED ORDER — PACLITAXEL PROTEIN-BOUND CHEMO INJECTION 100 MG
125.0000 mg/m2 | Freq: Once | INTRAVENOUS | Status: AC
Start: 2015-09-13 — End: 2015-09-13
  Administered 2015-09-13: 250 mg via INTRAVENOUS
  Filled 2015-09-13: qty 50

## 2015-09-13 MED ORDER — SODIUM CHLORIDE 0.9 % IV SOLN
Freq: Once | INTRAVENOUS | Status: AC
  Administered 2015-09-13: 09:00:00 via INTRAVENOUS
  Filled 2015-09-13: qty 4

## 2015-09-13 MED ORDER — SODIUM CHLORIDE 0.9 % IV SOLN
Freq: Once | INTRAVENOUS | Status: AC
  Administered 2015-09-13: 09:00:00 via INTRAVENOUS
  Filled 2015-09-13: qty 1000

## 2015-09-13 MED ORDER — SODIUM CHLORIDE 0.9 % IV SOLN
2000.0000 mg | Freq: Once | INTRAVENOUS | Status: AC
  Administered 2015-09-13: 2000 mg via INTRAVENOUS
  Filled 2015-09-13: qty 52.63

## 2015-09-14 ENCOUNTER — Telehealth: Payer: Self-pay

## 2015-09-14 ENCOUNTER — Other Ambulatory Visit: Payer: PPO

## 2015-09-14 NOTE — Telephone Encounter (Signed)
  Oncology Nurse Navigator Documentation    Navigator Encounter Type: Telephone (09/14/15 1000) Patient Visit Type: Surgery (09/14/15 1000)                    Time Spent with Patient: 15 (09/14/15 1000)   Spoke to Mrs Edmison regarding surgical consult to determine if Mr Reiner would be a candidate at this time for resection. Referral was sent to Dr Mariah Milling at [facility]. Was then informed that Mrs Feathers has made a self referral for him with Dr Crisoforo Oxford at [facility]. She stated she wanted to go through with both appts. She is aware that [facility] will be contacting her for appt.

## 2015-09-15 ENCOUNTER — Ambulatory Visit (INDEPENDENT_AMBULATORY_CARE_PROVIDER_SITE_OTHER): Payer: PPO | Admitting: *Deleted

## 2015-09-15 DIAGNOSIS — Z5181 Encounter for therapeutic drug level monitoring: Secondary | ICD-10-CM | POA: Diagnosis not present

## 2015-09-15 DIAGNOSIS — I82403 Acute embolism and thrombosis of unspecified deep veins of lower extremity, bilateral: Secondary | ICD-10-CM

## 2015-09-15 LAB — POCT INR: INR: 1.2

## 2015-09-15 NOTE — Progress Notes (Signed)
  Oncology Nurse Navigator Documentation    Navigator Encounter Type: Telephone (09/15/15 1000) Patient Visit Type: Surgery (09/15/15 1000)       Interventions: Coordination of Care (09/15/15 1000)   Coordination of Care:  (Appt with Dr Mariah Milling at [facility]) (09/15/15 1000)        Time Spent with Patient: 15 (09/15/15 1000)   Appt has been arranged with Dr Mariah Milling at [facility] for surgery consult. 09/20/15 at 1430.

## 2015-09-15 NOTE — Progress Notes (Signed)
Pre visit review using our clinic review tool, if applicable. No additional management support is needed unless otherwise documented below in the visit note. 

## 2015-09-16 DIAGNOSIS — C259 Malignant neoplasm of pancreas, unspecified: Secondary | ICD-10-CM | POA: Insufficient documentation

## 2015-09-19 ENCOUNTER — Other Ambulatory Visit: Payer: Self-pay | Admitting: *Deleted

## 2015-09-19 ENCOUNTER — Inpatient Hospital Stay: Payer: PPO

## 2015-09-19 DIAGNOSIS — I82403 Acute embolism and thrombosis of unspecified deep veins of lower extremity, bilateral: Secondary | ICD-10-CM

## 2015-09-19 DIAGNOSIS — C4492 Squamous cell carcinoma of skin, unspecified: Secondary | ICD-10-CM

## 2015-09-19 DIAGNOSIS — Z5111 Encounter for antineoplastic chemotherapy: Secondary | ICD-10-CM | POA: Diagnosis not present

## 2015-09-19 LAB — PROTIME-INR
INR: 2.03
PROTHROMBIN TIME: 22.8 s — AB (ref 11.4–15.0)

## 2015-09-19 LAB — POCT INR: INR: 2.03

## 2015-09-20 ENCOUNTER — Inpatient Hospital Stay: Payer: PPO

## 2015-09-20 ENCOUNTER — Ambulatory Visit (INDEPENDENT_AMBULATORY_CARE_PROVIDER_SITE_OTHER): Payer: PPO | Admitting: *Deleted

## 2015-09-20 ENCOUNTER — Inpatient Hospital Stay (HOSPITAL_BASED_OUTPATIENT_CLINIC_OR_DEPARTMENT_OTHER): Payer: PPO | Admitting: Oncology

## 2015-09-20 VITALS — BP 118/70 | HR 82 | Temp 97.2°F | Resp 16 | Wt 164.7 lb

## 2015-09-20 DIAGNOSIS — C259 Malignant neoplasm of pancreas, unspecified: Secondary | ICD-10-CM

## 2015-09-20 DIAGNOSIS — R110 Nausea: Secondary | ICD-10-CM

## 2015-09-20 DIAGNOSIS — E1165 Type 2 diabetes mellitus with hyperglycemia: Secondary | ICD-10-CM

## 2015-09-20 DIAGNOSIS — R531 Weakness: Secondary | ICD-10-CM

## 2015-09-20 DIAGNOSIS — D649 Anemia, unspecified: Secondary | ICD-10-CM

## 2015-09-20 DIAGNOSIS — C4492 Squamous cell carcinoma of skin, unspecified: Secondary | ICD-10-CM

## 2015-09-20 DIAGNOSIS — R5383 Other fatigue: Secondary | ICD-10-CM

## 2015-09-20 DIAGNOSIS — Z86718 Personal history of other venous thrombosis and embolism: Secondary | ICD-10-CM

## 2015-09-20 DIAGNOSIS — Z5111 Encounter for antineoplastic chemotherapy: Secondary | ICD-10-CM | POA: Diagnosis not present

## 2015-09-20 DIAGNOSIS — Z7901 Long term (current) use of anticoagulants: Secondary | ICD-10-CM

## 2015-09-20 DIAGNOSIS — C787 Secondary malignant neoplasm of liver and intrahepatic bile duct: Principal | ICD-10-CM

## 2015-09-20 DIAGNOSIS — R5381 Other malaise: Secondary | ICD-10-CM

## 2015-09-20 DIAGNOSIS — R590 Localized enlarged lymph nodes: Secondary | ICD-10-CM

## 2015-09-20 DIAGNOSIS — Z794 Long term (current) use of insulin: Secondary | ICD-10-CM

## 2015-09-20 LAB — CBC WITH DIFFERENTIAL/PLATELET
BASOS ABS: 0 10*3/uL (ref 0–0.1)
Basophils Relative: 1 %
Eosinophils Absolute: 0 10*3/uL (ref 0–0.7)
Eosinophils Relative: 1 %
HEMATOCRIT: 32.8 % — AB (ref 40.0–52.0)
HEMOGLOBIN: 10.7 g/dL — AB (ref 13.0–18.0)
LYMPHS PCT: 16 %
Lymphs Abs: 0.9 10*3/uL — ABNORMAL LOW (ref 1.0–3.6)
MCH: 26.3 pg (ref 26.0–34.0)
MCHC: 32.7 g/dL (ref 32.0–36.0)
MCV: 80.3 fL (ref 80.0–100.0)
Monocytes Absolute: 0.5 10*3/uL (ref 0.2–1.0)
Monocytes Relative: 8 %
NEUTROS ABS: 4.2 10*3/uL (ref 1.4–6.5)
Neutrophils Relative %: 74 %
Platelets: 229 10*3/uL (ref 150–440)
RBC: 4.08 MIL/uL — AB (ref 4.40–5.90)
RDW: 19 % — ABNORMAL HIGH (ref 11.5–14.5)
WBC: 5.7 10*3/uL (ref 3.8–10.6)

## 2015-09-20 LAB — COMPREHENSIVE METABOLIC PANEL
ALBUMIN: 3.3 g/dL — AB (ref 3.5–5.0)
ALK PHOS: 57 U/L (ref 38–126)
ALT: 11 U/L — ABNORMAL LOW (ref 17–63)
ANION GAP: 8 (ref 5–15)
AST: 16 U/L (ref 15–41)
BUN: 11 mg/dL (ref 6–20)
CALCIUM: 8.8 mg/dL — AB (ref 8.9–10.3)
CHLORIDE: 102 mmol/L (ref 101–111)
CO2: 25 mmol/L (ref 22–32)
Creatinine, Ser: 0.61 mg/dL (ref 0.61–1.24)
GFR calc Af Amer: >60 mL/min (ref >60)
GFR calc non Af Amer: >60 mL/min (ref >60)
GLUCOSE: 286 mg/dL — AB (ref 65–99)
POTASSIUM: 3.6 mmol/L (ref 3.5–5.1)
SODIUM: 135 mmol/L (ref 135–145)
Total Bilirubin: 0.2 mg/dL — ABNORMAL LOW (ref 0.3–1.2)
Total Protein: 6.3 g/dL — ABNORMAL LOW (ref 6.5–8.1)

## 2015-09-20 MED ORDER — SODIUM CHLORIDE 0.9 % IV SOLN
Freq: Once | INTRAVENOUS | Status: AC
  Administered 2015-09-20: 11:00:00 via INTRAVENOUS
  Filled 2015-09-20: qty 1000

## 2015-09-20 MED ORDER — SODIUM CHLORIDE 0.9 % IV SOLN
Freq: Once | INTRAVENOUS | Status: AC
  Administered 2015-09-20: 11:00:00 via INTRAVENOUS
  Filled 2015-09-20: qty 4

## 2015-09-20 MED ORDER — SODIUM CHLORIDE 0.9 % IV SOLN
2000.0000 mg | Freq: Once | INTRAVENOUS | Status: AC
  Administered 2015-09-20: 2000 mg via INTRAVENOUS
  Filled 2015-09-20: qty 52.6

## 2015-09-20 MED ORDER — HEPARIN SOD (PORK) LOCK FLUSH 100 UNIT/ML IV SOLN
500.0000 [IU] | Freq: Once | INTRAVENOUS | Status: AC
  Administered 2015-09-20: 500 [IU] via INTRAVENOUS
  Filled 2015-09-20: qty 5

## 2015-09-20 MED ORDER — SODIUM CHLORIDE 0.9 % IJ SOLN
10.0000 mL | INTRAMUSCULAR | Status: DC | PRN
  Administered 2015-09-20: 10 mL via INTRAVENOUS
  Filled 2015-09-20: qty 10

## 2015-09-20 MED ORDER — PACLITAXEL PROTEIN-BOUND CHEMO INJECTION 100 MG
125.0000 mg/m2 | Freq: Once | INTRAVENOUS | Status: AC
  Administered 2015-09-20: 250 mg via INTRAVENOUS
  Filled 2015-09-20: qty 50

## 2015-09-20 NOTE — Progress Notes (Signed)
Pre visit review using our clinic review tool, if applicable. No additional management support is needed unless otherwise documented below in the visit note. 

## 2015-09-20 NOTE — Progress Notes (Signed)
Patient's fatigue is gradually increasing.  He is having bilateral leg pain in the thigh area and can feel knots under the skin.  Has an appointment with Dr Mariah Milling at [facility] for surgery consult this afternoon.

## 2015-09-22 ENCOUNTER — Ambulatory Visit: Payer: PPO

## 2015-09-26 ENCOUNTER — Ambulatory Visit (INDEPENDENT_AMBULATORY_CARE_PROVIDER_SITE_OTHER): Payer: PPO | Admitting: *Deleted

## 2015-09-26 DIAGNOSIS — I82403 Acute embolism and thrombosis of unspecified deep veins of lower extremity, bilateral: Secondary | ICD-10-CM | POA: Diagnosis not present

## 2015-09-26 DIAGNOSIS — Z5181 Encounter for therapeutic drug level monitoring: Secondary | ICD-10-CM

## 2015-09-26 LAB — POCT INR: INR: 2.8

## 2015-09-26 NOTE — Progress Notes (Signed)
Pre visit review using our clinic review tool, if applicable. No additional management support is needed unless otherwise documented below in the visit note. 

## 2015-09-27 ENCOUNTER — Inpatient Hospital Stay: Payer: PPO

## 2015-09-27 ENCOUNTER — Inpatient Hospital Stay (HOSPITAL_BASED_OUTPATIENT_CLINIC_OR_DEPARTMENT_OTHER): Payer: PPO | Admitting: Oncology

## 2015-09-27 VITALS — BP 121/71 | HR 67 | Temp 97.7°F | Resp 18 | Wt 167.1 lb

## 2015-09-27 DIAGNOSIS — T451X5S Adverse effect of antineoplastic and immunosuppressive drugs, sequela: Secondary | ICD-10-CM

## 2015-09-27 DIAGNOSIS — C787 Secondary malignant neoplasm of liver and intrahepatic bile duct: Secondary | ICD-10-CM | POA: Diagnosis not present

## 2015-09-27 DIAGNOSIS — D6959 Other secondary thrombocytopenia: Secondary | ICD-10-CM

## 2015-09-27 DIAGNOSIS — R590 Localized enlarged lymph nodes: Secondary | ICD-10-CM

## 2015-09-27 DIAGNOSIS — M79652 Pain in left thigh: Secondary | ICD-10-CM

## 2015-09-27 DIAGNOSIS — C259 Malignant neoplasm of pancreas, unspecified: Secondary | ICD-10-CM | POA: Diagnosis not present

## 2015-09-27 DIAGNOSIS — D649 Anemia, unspecified: Secondary | ICD-10-CM

## 2015-09-27 DIAGNOSIS — R110 Nausea: Secondary | ICD-10-CM

## 2015-09-27 DIAGNOSIS — Z86718 Personal history of other venous thrombosis and embolism: Secondary | ICD-10-CM

## 2015-09-27 DIAGNOSIS — E1165 Type 2 diabetes mellitus with hyperglycemia: Secondary | ICD-10-CM

## 2015-09-27 DIAGNOSIS — M79651 Pain in right thigh: Secondary | ICD-10-CM

## 2015-09-27 DIAGNOSIS — Z5111 Encounter for antineoplastic chemotherapy: Secondary | ICD-10-CM | POA: Diagnosis not present

## 2015-09-27 DIAGNOSIS — Z7901 Long term (current) use of anticoagulants: Secondary | ICD-10-CM

## 2015-09-27 LAB — CBC WITH DIFFERENTIAL/PLATELET
BASOS PCT: 0 %
Basophils Absolute: 0 10*3/uL (ref 0–0.1)
EOS ABS: 0.1 10*3/uL (ref 0–0.7)
EOS PCT: 2 %
HCT: 32.1 % — ABNORMAL LOW (ref 40.0–52.0)
Hemoglobin: 10.3 g/dL — ABNORMAL LOW (ref 13.0–18.0)
Lymphocytes Relative: 21 %
Lymphs Abs: 0.9 10*3/uL — ABNORMAL LOW (ref 1.0–3.6)
MCH: 26.2 pg (ref 26.0–34.0)
MCHC: 32.1 g/dL (ref 32.0–36.0)
MCV: 81.6 fL (ref 80.0–100.0)
MONO ABS: 0.5 10*3/uL (ref 0.2–1.0)
MONOS PCT: 11 %
NEUTROS PCT: 66 %
Neutro Abs: 2.9 10*3/uL (ref 1.4–6.5)
PLATELETS: 129 10*3/uL — AB (ref 150–440)
RBC: 3.94 MIL/uL — ABNORMAL LOW (ref 4.40–5.90)
RDW: 19.2 % — AB (ref 11.5–14.5)
WBC: 4.4 10*3/uL (ref 3.8–10.6)

## 2015-09-27 LAB — COMPREHENSIVE METABOLIC PANEL
ALK PHOS: 59 U/L (ref 38–126)
ALT: 12 U/L — AB (ref 17–63)
AST: 14 U/L — ABNORMAL LOW (ref 15–41)
Albumin: 3.3 g/dL — ABNORMAL LOW (ref 3.5–5.0)
Anion gap: 6 (ref 5–15)
BUN: 9 mg/dL (ref 6–20)
CALCIUM: 8.5 mg/dL — AB (ref 8.9–10.3)
CO2: 25 mmol/L (ref 22–32)
CREATININE: 0.62 mg/dL (ref 0.61–1.24)
Chloride: 105 mmol/L (ref 101–111)
GFR calc non Af Amer: >60 mL/min (ref >60)
GLUCOSE: 229 mg/dL — AB (ref 65–99)
Potassium: 3.7 mmol/L (ref 3.5–5.1)
SODIUM: 136 mmol/L (ref 135–145)
Total Bilirubin: 0.2 mg/dL — ABNORMAL LOW (ref 0.3–1.2)
Total Protein: 6.1 g/dL — ABNORMAL LOW (ref 6.5–8.1)

## 2015-09-27 MED ORDER — SODIUM CHLORIDE 0.9 % IV SOLN
Freq: Once | INTRAVENOUS | Status: AC
  Administered 2015-09-27: 12:00:00 via INTRAVENOUS
  Filled 2015-09-27: qty 4

## 2015-09-27 MED ORDER — SODIUM CHLORIDE 0.9 % IV SOLN
Freq: Once | INTRAVENOUS | Status: AC
  Administered 2015-09-27: 12:00:00 via INTRAVENOUS
  Filled 2015-09-27: qty 1000

## 2015-09-27 MED ORDER — HEPARIN SOD (PORK) LOCK FLUSH 100 UNIT/ML IV SOLN
500.0000 [IU] | Freq: Once | INTRAVENOUS | Status: AC
  Administered 2015-09-27: 500 [IU] via INTRAVENOUS
  Filled 2015-09-27: qty 5

## 2015-09-27 MED ORDER — SODIUM CHLORIDE 0.9 % IJ SOLN
10.0000 mL | INTRAMUSCULAR | Status: DC | PRN
Start: 2015-09-27 — End: 2015-09-27
  Administered 2015-09-27: 10 mL via INTRAVENOUS
  Filled 2015-09-27: qty 10

## 2015-09-27 MED ORDER — GEMCITABINE HCL CHEMO INJECTION 1 GM/26.3ML
2000.0000 mg | Freq: Once | INTRAVENOUS | Status: AC
  Administered 2015-09-27: 2000 mg via INTRAVENOUS
  Filled 2015-09-27: qty 52.6

## 2015-09-27 MED ORDER — PACLITAXEL PROTEIN-BOUND CHEMO INJECTION 100 MG
125.0000 mg/m2 | Freq: Once | INTRAVENOUS | Status: AC
  Administered 2015-09-27: 250 mg via INTRAVENOUS
  Filled 2015-09-27: qty 50

## 2015-09-27 NOTE — Progress Notes (Signed)
[place]  [phone]) [phone] [phone]) [phone]  ID: Abrum Hennington OB: 1940/10/21  MR#: [id number]  [id number]  Patient Care Team: Tonia Ghent, MD as PCP - General (Family Medicine) Hessie Knows, MD as Referring Physician (Orthopedic Surgery) Clent Jacks, RN as Registered Nurse  CHIEF COMPLAINT:  No chief complaint on file.   INTERVAL HISTORY: Patient returns to clinic today for discussion of his imaging results and consideration of cycle 4, day 1 of gemcitabine and Abraxane. He currently feels well and is asymptomatic. He has no neurologic complaints. He denies any easy bleeding or bruising. He denies any fevers.  He denies any chest pain or shortness of breath. He has occasional nausea, but denies any vomiting, constipation, or diarrhea. He has no urinary complaints. Patient offers no further specific complaints today.  REVIEW OF SYSTEMS:   Review of Systems  Constitutional: Positive for malaise/fatigue. Negative for fever and weight loss.  Eyes: Negative for pain, discharge and redness.  Cardiovascular: Negative for leg swelling.  Gastrointestinal: Positive for nausea. Negative for abdominal pain.  Musculoskeletal: Negative.   Neurological: Positive for weakness.  Endo/Heme/Allergies: Does not bruise/bleed easily.    As per HPI. Otherwise, a complete review of systems is negatve.  PAST MEDICAL HISTORY: Past Medical History  Diagnosis Date  . Hypertension   . DVT of leg (deep venous thrombosis) ([place]) 2009  . Nephrolithiasis   . Hyperlipidemia   . Allergic rhinitis   . CAD (coronary artery disease)   . Urethral diverticulum   . Varicose vein of leg   . Dupuytren's contracture   . ED (erectile dysfunction)   . History of colonic polyps   . GERD (gastroesophageal reflux disease)   . Diverticulosis of colon   . Myocardial infarction ([place])   . DVT (deep venous thrombosis) ([place])   . Polio 1948    was hospitalized for 1 year  .  Diabetes mellitus, type 2 ([place])     Patient takes Metformin.  . SCC (squamous cell carcinoma)     scalp 2013 per derm    PAST SURGICAL HISTORY: Past Surgical History  Procedure Laterality Date  . Appendectomy  1994  . Umbilical hernia repair  03/14/2005  . 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 Replacement total knee Left 2016  . Peripheral vascular catheterization N/A 06/23/2015    Procedure: [name];  Surgeon: Algernon Huxley, MD;  Location: [facility];  Service: Cardiovascular;  Laterality: N/A;    FAMILY HISTORY Family History  Problem Relation Age of Onset  . Diabetes Mother   . Hypertension Mother   . Stroke Mother   . Alzheimer's disease Mother   . Heart attack Father   . Hypertension Father   . Prostate cancer Brother   . Diabetes Brother   . Hypertension Brother   . Diabetes Sister   . Colon cancer Neg Hx   . Hypertension Daughter        ADVANCED DIRECTIVES:    HEALTH MAINTENANCE: Social History  Substance Use Topics  . Smoking status: Former Smoker    Quit date: 10/08/1988  . Smokeless tobacco: Never Used  . Alcohol Use: 0.0 oz/week    0 Standard drinks or equivalent per week     Comment: RARE     Colonoscopy:  PAP:  Bone density:  Lipid panel:  Allergies  Allergen Reactions  . Glimepiride Other (See Comments)    Leg stiffness  .  Lipitor [Atorvastatin Calcium]     Tolerates crestor  . Oxycodone Other (See Comments)    Altered mental state  . Tape   . Latex Rash    Current Outpatient Prescriptions  Medication Sig Dispense Refill  . carvedilol (COREG) 6.25 MG tablet Take 1 tablet (6.25 mg total) by mouth 2 (two) times daily with a meal. 180 tablet 3  . esomeprazole (NEXIUM) 20 MG capsule Take 20 mg by mouth daily as needed (HEARTBURN).    . Insulin Glargine (LANTUS SOLOSTAR) 100 UNIT/ML Solostar Pen Inject 5 units once a day.  Can add 1 unit per day if AM sugar is >150. 5 pen PRN  . lidocaine-prilocaine (EMLA)  cream Apply 1 application topically as needed. Apply to port 1 hour prior to chemotherapy appointment, cover with plastic wrap. 30 g 2  . lisinopril (PRINIVIL,ZESTRIL) 10 MG tablet Take 1 tablet (10 mg total) by mouth daily. 90 tablet 3  . metFORMIN (GLUCOPHAGE) 1000 MG tablet Take up to 1000mg  in the AM and 500mg  in the PM.  If GI upset, cut back to 1000mg  a day total.    . nitroGLYCERIN (NITROSTAT) 0.4 MG SL tablet Place under the tongue.    . prochlorperazine (COMPAZINE) 10 MG tablet Take 1 tablet (10 mg total) by mouth every 6 (six) hours as needed for nausea or vomiting. 30 tablet 2  . rosuvastatin (CRESTOR) 20 MG tablet HELD 09/05/15    . sitaGLIPtin (JANUVIA) 100 MG tablet Take 1 tablet (100 mg total) by mouth daily. 30 tablet 12  . warfarin (COUMADIN) 5 MG tablet 7.5mg  on Monday, Wednesday, and Friday; 5mg  every remaining day 30 tablet 5   No current facility-administered medications for this visit.    OBJECTIVE: There were no vitals filed for this visit.   There is no weight on file to calculate BMI.    ECOG FS:1 - Symptomatic but completely ambulatory  General: Well-developed, well-nourished, no acute distress. Eyes: Pink conjunctiva, anicteric sclera. Lungs: Clear to auscultation bilaterally. Heart: Regular rate and rhythm. No rubs, murmurs, or gallops. Abdomen: Soft, nontender, nondistended. No organomegaly noted, normoactive bowel sounds. Musculoskeletal: Minimal bilateral edema, mild erythema of bilateral ankles. Neuro: Alert, answering all questions appropriately. Cranial nerves grossly intact. Skin: No rashes or petechiae noted. Psych: Normal affect.    LAB RESULTS:  Lab Results  Component Value Date   NA 135 09/20/2015   K 3.6 09/20/2015   CL 102 09/20/2015   CO2 25 09/20/2015   GLUCOSE 286* 09/20/2015   BUN 11 09/20/2015   CREATININE 0.61 09/20/2015   CALCIUM 8.8* 09/20/2015   PROT 6.3* 09/20/2015   ALBUMIN 3.3* 09/20/2015   AST 16 09/20/2015   ALT 11*  09/20/2015   ALKPHOS 57 09/20/2015   BILITOT 0.2* 09/20/2015   GFRNONAA >60 09/20/2015   GFRAA >60 09/20/2015    Lab Results  Component Value Date   WBC 5.7 09/20/2015   NEUTROABS 4.2 09/20/2015   HGB 10.7* 09/20/2015   HCT 32.8* 09/20/2015   MCV 80.3 09/20/2015   PLT 229 09/20/2015     STUDIES: Nm Pet Image Restag (ps) Skull Base To Thigh  09/09/2015  CLINICAL DATA:  Subsequent treatment strategy for pancreatic cancer with liver metastasis. EXAM: NUCLEAR MEDICINE PET SKULL BASE TO THIGH TECHNIQUE: 13.1 mCi F-18 FDG was injected intravenously. Full-ring PET imaging was performed from the skull base to thigh after the radiotracer. CT data was obtained and used for attenuation correction and anatomic localization. FASTING BLOOD GLUCOSE:  Value:  99 mg/dl COMPARISON:  PET-CT dated 06/20/2015. MRI abdomen dated 06/01/2015. CT abdomen pelvis dated 05/31/2015. FINDINGS: NECK No hypermetabolic lymph nodes in the neck. CHEST No suspicious pulmonary nodules. Mild dependent atelectasis in the bilateral lower lobes. Cardiomegaly. No pericardial effusion. Coronary atherosclerosis. Atherosclerotic calcifications of the aortic arch. Right chest port terminates at the cavoatrial junction. No hypermetabolic thoracic lymphadenopathy. ABDOMEN/PELVIS 3.1 x 3.4 cm predominantly solid mass along the pancreatic body (series 3/ image 146), max SUV 7.2, previously 3.3. Adjacent 2.8 x 3.5 cm necrotic mass along the inferior aspect of the pancreatic head/body, max SUV 5.3, previously 3.3. 12 mm short axis node along the superior aspect of the splenic vein (series 3/ image 140), max SUV 5.8, previously 3.3. Prior focal hypermetabolism along the anterior right hepatic dome, corresponding to metastasis on prior MRI, is not evident on the current PET. SKELETON No focal hypermetabolic activity to suggest skeletal metastasis. IMPRESSION: Increased hypermetabolism associated with the pancreatic mass and associated regional  adenopathy, as described above. Metastasis along the right hepatic dome is not evident on the current PET. Otherwise, no evidence of distant metastases. Electronically Signed   By: Julian Hy M.D.   On: 09/09/2015 12:42    ASSESSMENT: Stage IV adenocarcinoma the pancreas.  PLAN:    1. Pancreatic cancer: PET scan results from September 09, 2015 reviewed independently and reported as above with improvement of disease burden and resolution of presumed liver metastasis. Despite this, patient's CA-19-9 has trended up slightly from 378-421. Proceed with cycle 4, day 1 of gemcitabine and Abraxane tomorrow. Patient will receive this regimen on days 1, 8, and 15 with day 22 off. Patient will have a consultation with surgery to see if he is now a surgical candidate given the resolution of his liver disease. Return to clinic in 1 week for consideration of cycle 4, day 8. 2. Thrombocytopenia: Resolved., Monitor. 3. Anemia: Mild, monitor. 4. Hyperglycemia:  Continue diabetic medications as prescribed.  5. DVTs: Likely secondary to decreased activity and malignancy. Continue Coumadin. Patient's INR levels are monitored by his primary care. Goal INR 2.0-3.0.  Patient expressed understanding and was in agreement with this plan. He also understands that He can call clinic at any time with any questions, concerns, or complaints.   Lloyd Huger, MD   09/27/2015 8:14 AM

## 2015-09-27 NOTE — Progress Notes (Signed)
Patient is feeling fatigued and has bilateral upper leg pain with walking.  I have discuss with him about our CARE program we offer and he will talk with wife and let us know if interested.

## 2015-09-27 NOTE — Progress Notes (Signed)
[place]  [phone] [phone]) [phone]  ID: Ronnie Johnson OB: February 04, 1941  MR#: [id number]  [id number]  Patient Care Team: Tonia Ghent, MD as PCP - General (Family Medicine) Hessie Knows, MD as Referring Physician (Orthopedic Surgery) Clent Jacks, RN as Registered Nurse  CHIEF COMPLAINT:  Chief Complaint  Patient presents with  . Pancreatic Cancer    INTERVAL HISTORY: Patient returns to clinic today for consideration of cycle 4, day 8 of gemcitabine and Abraxane. He states his fatigue is slightly worse, but he otherwise feels well. He has no neurologic complaints. He denies any easy bleeding or bruising. He denies any fevers.  He denies any chest pain or shortness of breath. He has occasional nausea, but denies any vomiting, constipation, or diarrhea. He has no urinary complaints. Patient offers no further specific complaints today.  REVIEW OF SYSTEMS:   Review of Systems  Constitutional: Positive for malaise/fatigue. Negative for fever and weight loss.  Eyes: Negative for pain, discharge and redness.  Cardiovascular: Negative for leg swelling.  Gastrointestinal: Positive for nausea. Negative for abdominal pain.  Musculoskeletal: Negative.   Neurological: Positive for weakness.  Endo/Heme/Allergies: Does not bruise/bleed easily.    As per HPI. Otherwise, a complete review of systems is negatve.  PAST MEDICAL HISTORY: Past Medical History  Diagnosis Date  . Hypertension   . DVT of leg (deep venous thrombosis) ([place]) 2009  . Nephrolithiasis   . Hyperlipidemia   . Allergic rhinitis   . CAD (coronary artery disease)   . Urethral diverticulum   . Varicose vein of leg   . Dupuytren's contracture   . ED (erectile dysfunction)   . History of colonic polyps   . GERD (gastroesophageal reflux disease)   . Diverticulosis of colon   . Myocardial infarction ([place])   . DVT (deep venous thrombosis) ([place])   . Polio 1948    was  hospitalized for 1 year  . Diabetes mellitus, type 2 ([place])     Patient takes Metformin.  . SCC (squamous cell carcinoma)     scalp 2013 per derm    PAST SURGICAL HISTORY: Past Surgical History  Procedure Laterality Date  . Appendectomy  1994  . Umbilical hernia repair  03/14/2005  . 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 Replacement total knee Left 2016  . Peripheral vascular catheterization N/A 06/23/2015    Procedure: [name];  Surgeon: Algernon Huxley, MD;  Location: [facility];  Service: Cardiovascular;  Laterality: N/A;    FAMILY HISTORY Family History  Problem Relation Age of Onset  . Diabetes Mother   . Hypertension Mother   . Stroke Mother   . Alzheimer's disease Mother   . Heart attack Father   . Hypertension Father   . Prostate cancer Brother   . Diabetes Brother   . Hypertension Brother   . Diabetes Sister   . Colon cancer Neg Hx   . Hypertension Daughter        ADVANCED DIRECTIVES:    HEALTH MAINTENANCE: Social History  Substance Use Topics  . Smoking status: Former Smoker    Quit date: 10/08/1988  . Smokeless tobacco: Never Used  . Alcohol Use: 0.0 oz/week    0 Standard drinks or equivalent per week     Comment: RARE     Colonoscopy:  PAP:  Bone density:  Lipid panel:  Allergies  Allergen Reactions  . Glimepiride Other (See Comments)  Leg stiffness  . Lipitor [Atorvastatin Calcium]     Tolerates crestor  . Oxycodone Other (See Comments)    Altered mental state  . Tape   . Latex Rash    Current Outpatient Prescriptions  Medication Sig Dispense Refill  . carvedilol (COREG) 6.25 MG tablet Take 1 tablet (6.25 mg total) by mouth 2 (two) times daily with a meal. 180 tablet 3  . esomeprazole (NEXIUM) 20 MG capsule Take 20 mg by mouth daily as needed (HEARTBURN).    . Insulin Glargine (LANTUS SOLOSTAR) 100 UNIT/ML Solostar Pen Inject 5 units once a day.  Can add 1 unit per day if AM sugar is >150. 5 pen PRN  .  lidocaine-prilocaine (EMLA) cream Apply 1 application topically as needed. Apply to port 1 hour prior to chemotherapy appointment, cover with plastic wrap. 30 g 2  . lisinopril (PRINIVIL,ZESTRIL) 10 MG tablet Take 1 tablet (10 mg total) by mouth daily. 90 tablet 3  . metFORMIN (GLUCOPHAGE) 1000 MG tablet Take up to 1000mg  in the AM and 500mg  in the PM.  If GI upset, cut back to 1000mg  a day total.    . nitroGLYCERIN (NITROSTAT) 0.4 MG SL tablet Place under the tongue.    . prochlorperazine (COMPAZINE) 10 MG tablet Take 1 tablet (10 mg total) by mouth every 6 (six) hours as needed for nausea or vomiting. 30 tablet 2  . rosuvastatin (CRESTOR) 20 MG tablet HELD 09/05/15    . sitaGLIPtin (JANUVIA) 100 MG tablet Take 1 tablet (100 mg total) by mouth daily. 30 tablet 12  . warfarin (COUMADIN) 5 MG tablet 7.5mg  on Monday, Wednesday, and Friday; 5mg  every remaining day 30 tablet 5   No current facility-administered medications for this visit.    OBJECTIVE: Filed Vitals:   09/20/15 0904  BP: 118/70  Pulse: 82  Temp: 97.2 F (36.2 C)  Resp: 16     Body mass index is 25.79 kg/(m^2).    ECOG FS:1 - Symptomatic but completely ambulatory  General: Well-developed, well-nourished, no acute distress. Eyes: Pink conjunctiva, anicteric sclera. Lungs: Clear to auscultation bilaterally. Heart: Regular rate and rhythm. No rubs, murmurs, or gallops. Abdomen: Soft, nontender, nondistended. No organomegaly noted, normoactive bowel sounds. Musculoskeletal: Minimal bilateral edema, mild erythema of bilateral ankles. Neuro: Alert, answering all questions appropriately. Cranial nerves grossly intact. Skin: No rashes or petechiae noted. Psych: Normal affect.    LAB RESULTS:  Lab Results  Component Value Date   NA 135 09/20/2015   K 3.6 09/20/2015   CL 102 09/20/2015   CO2 25 09/20/2015   GLUCOSE 286* 09/20/2015   BUN 11 09/20/2015   CREATININE 0.61 09/20/2015   CALCIUM 8.8* 09/20/2015   PROT 6.3*  09/20/2015   ALBUMIN 3.3* 09/20/2015   AST 16 09/20/2015   ALT 11* 09/20/2015   ALKPHOS 57 09/20/2015   BILITOT 0.2* 09/20/2015   GFRNONAA >60 09/20/2015   GFRAA >60 09/20/2015    Lab Results  Component Value Date   WBC 5.7 09/20/2015   NEUTROABS 4.2 09/20/2015   HGB 10.7* 09/20/2015   HCT 32.8* 09/20/2015   MCV 80.3 09/20/2015   PLT 229 09/20/2015     STUDIES: Nm Pet Image Restag (ps) Skull Base To Thigh  09/09/2015  CLINICAL DATA:  Subsequent treatment strategy for pancreatic cancer with liver metastasis. EXAM: NUCLEAR MEDICINE PET SKULL BASE TO THIGH TECHNIQUE: 13.1 mCi F-18 FDG was injected intravenously. Full-ring PET imaging was performed from the skull base to thigh after the radiotracer. CT  data was obtained and used for attenuation correction and anatomic localization. FASTING BLOOD GLUCOSE:  Value: 99 mg/dl COMPARISON:  PET-CT dated 06/20/2015. MRI abdomen dated 06/01/2015. CT abdomen pelvis dated 05/31/2015. FINDINGS: NECK No hypermetabolic lymph nodes in the neck. CHEST No suspicious pulmonary nodules. Mild dependent atelectasis in the bilateral lower lobes. Cardiomegaly. No pericardial effusion. Coronary atherosclerosis. Atherosclerotic calcifications of the aortic arch. Right chest port terminates at the cavoatrial junction. No hypermetabolic thoracic lymphadenopathy. ABDOMEN/PELVIS 3.1 x 3.4 cm predominantly solid mass along the pancreatic body (series 3/ image 146), max SUV 7.2, previously 3.3. Adjacent 2.8 x 3.5 cm necrotic mass along the inferior aspect of the pancreatic head/body, max SUV 5.3, previously 3.3. 12 mm short axis node along the superior aspect of the splenic vein (series 3/ image 140), max SUV 5.8, previously 3.3. Prior focal hypermetabolism along the anterior right hepatic dome, corresponding to metastasis on prior MRI, is not evident on the current PET. SKELETON No focal hypermetabolic activity to suggest skeletal metastasis. IMPRESSION: Increased  hypermetabolism associated with the pancreatic mass and associated regional adenopathy, as described above. Metastasis along the right hepatic dome is not evident on the current PET. Otherwise, no evidence of distant metastases. Electronically Signed   By: Julian Hy M.D.   On: 09/09/2015 12:42    ASSESSMENT: Stage IV adenocarcinoma the pancreas.  PLAN:    1. Pancreatic cancer: PET scan results from September 09, 2015 reviewed independently and reported as above with improvement of disease burden and resolution of presumed liver metastasis. Despite this, patient's CA-19-9 has trended up slightly from 378-421. Proceed with cycle 4, day 8 of gemcitabine and Abraxane tomorrow. Patient will receive this regimen on days 1, 8, and 15 with day 22 off. Patient consultation with surgery at [facility] who recommended completing 3 additional cycles of chemotherapy and then reimage prior to considering surgery. Return to clinic in 1 week for consideration of cycle 4, day 15. 2. Thrombocytopenia: Resolved., Monitor. 3. Anemia: Mild, monitor. 4. Hyperglycemia:  Continue diabetic medications as prescribed.  5. DVTs: Likely secondary to decreased activity and malignancy. Continue Coumadin. Patient's INR levels are monitored by his primary care. Goal INR 2.0-3.0.  Patient expressed understanding and was in agreement with this plan. He also understands that He can call clinic at any time with any questions, concerns, or complaints.   Lloyd Huger, MD   09/27/2015 8:18 AM

## 2015-09-28 ENCOUNTER — Ambulatory Visit: Payer: PPO

## 2015-09-28 ENCOUNTER — Other Ambulatory Visit: Payer: PPO

## 2015-09-29 ENCOUNTER — Other Ambulatory Visit: Payer: Self-pay

## 2015-09-29 MED ORDER — ONETOUCH ULTRA SYSTEM W/DEVICE KIT: PACK | Status: DC

## 2015-09-29 NOTE — Telephone Encounter (Signed)
Mrs Tremonti said pt has lost his glucose meter and request one touch ultra to [facility]; does not need any other diabetic supplies. Pt last seen 06/28/15. Advised done.

## 2015-10-07 ENCOUNTER — Other Ambulatory Visit: Payer: Self-pay | Admitting: *Deleted

## 2015-10-07 DIAGNOSIS — C787 Secondary malignant neoplasm of liver and intrahepatic bile duct: Principal | ICD-10-CM

## 2015-10-07 DIAGNOSIS — C259 Malignant neoplasm of pancreas, unspecified: Secondary | ICD-10-CM

## 2015-10-09 DIAGNOSIS — S7290XA Unspecified fracture of unspecified femur, initial encounter for closed fracture: Secondary | ICD-10-CM

## 2015-10-09 HISTORY — DX: Unspecified fracture of unspecified femur, initial encounter for closed fracture: S72.90XA

## 2015-10-09 NOTE — Progress Notes (Signed)
[place]  [phone] [phone]) [phone]  ID: Ronnie Johnson OB: July 25, 1941  MR#: [id number]  JTT#:[id number]  Patient Care Team: Tonia Ghent, MD as PCP - General (Family Medicine) Hessie Knows, MD as Referring Physician (Orthopedic Surgery) Clent Jacks, RN as Registered Nurse  CHIEF COMPLAINT:  Chief Complaint  Patient presents with  . Pancreatic Cancer    INTERVAL HISTORY: Patient returns to clinic today for consideration of cycle 4, day 15 of gemcitabine and Abraxane. He states his fatigue is slightly worse. He also complains of bilateral upper leg pain particularly with walking.  He has no neurologic complaints. He denies any easy bleeding or bruising. He denies any fevers. He denies any chest pain or shortness of breath. He has occasional nausea, but denies any vomiting, constipation, or diarrhea. He has no urinary complaints. Patient offers no further specific complaints today.  REVIEW OF SYSTEMS:   Review of Systems  Constitutional: Positive for malaise/fatigue. Negative for fever and weight loss.  Eyes: Negative for pain, discharge and redness.  Cardiovascular: Negative for leg swelling.  Gastrointestinal: Positive for nausea. Negative for abdominal pain.  Musculoskeletal: Positive for myalgias.  Neurological: Positive for weakness.  Endo/Heme/Allergies: Does not bruise/bleed easily.    As per HPI. Otherwise, a complete review of systems is negatve.  PAST MEDICAL HISTORY: Past Medical History  Diagnosis Date  . Hypertension   . DVT of leg (deep venous thrombosis) ([place]) 2009  . Nephrolithiasis   . Hyperlipidemia   . Allergic rhinitis   . CAD (coronary artery disease)   . Urethral diverticulum   . Varicose vein of leg   . Dupuytren's contracture   . ED (erectile dysfunction)   . History of colonic polyps   . GERD (gastroesophageal reflux disease)   . Diverticulosis of colon   . Myocardial infarction ([place])   . DVT  (deep venous thrombosis) ([place])   . Polio 1948    was hospitalized for 1 year  . Diabetes mellitus, type 2 ([place])     Patient takes Metformin.  . SCC (squamous cell carcinoma)     scalp 2013 per derm    PAST SURGICAL HISTORY: Past Surgical History  Procedure Laterality Date  . Appendectomy  1994  . Umbilical hernia repair  03/14/2005  . 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 Replacement total knee Left 2016  . Peripheral vascular catheterization N/A 06/23/2015    Procedure: [name];  Surgeon: Algernon Huxley, MD;  Location: [facility];  Service: Cardiovascular;  Laterality: N/A;    FAMILY HISTORY Family History  Problem Relation Age of Onset  . Diabetes Mother   . Hypertension Mother   . Stroke Mother   . Alzheimer's disease Mother   . Heart attack Father   . Hypertension Father   . Prostate cancer Brother   . Diabetes Brother   . Hypertension Brother   . Diabetes Sister   . Colon cancer Neg Hx   . Hypertension Daughter        ADVANCED DIRECTIVES:    HEALTH MAINTENANCE: Social History  Substance Use Topics  . Smoking status: Former Smoker    Quit date: 10/08/1988  . Smokeless tobacco: Never Used  . Alcohol Use: 0.0 oz/week    0 Standard drinks or equivalent per week     Comment: RARE     Colonoscopy:  PAP:  Bone density:  Lipid panel:  Allergies  Allergen Reactions  .  Glimepiride Other (See Comments)    Leg stiffness  . Lipitor [Atorvastatin Calcium]     Tolerates crestor  . Oxycodone Other (See Comments)    Altered mental state  . Tape   . Latex Rash    Current Outpatient Prescriptions  Medication Sig Dispense Refill  . carvedilol (COREG) 6.25 MG tablet Take 1 tablet (6.25 mg total) by mouth 2 (two) times daily with a meal. 180 tablet 3  . esomeprazole (NEXIUM) 20 MG capsule Take 20 mg by mouth daily as needed (HEARTBURN).    . Insulin Glargine (LANTUS SOLOSTAR) 100 UNIT/ML Solostar Pen Inject 5 units once a day.  Can  add 1 unit per day if AM sugar is >150. 5 pen PRN  . lidocaine-prilocaine (EMLA) cream Apply 1 application topically as needed. Apply to port 1 hour prior to chemotherapy appointment, cover with plastic wrap. 30 g 2  . lisinopril (PRINIVIL,ZESTRIL) 10 MG tablet Take 1 tablet (10 mg total) by mouth daily. 90 tablet 3  . metFORMIN (GLUCOPHAGE) 1000 MG tablet Take up to 1067m in the AM and 5059min the PM.  If GI upset, cut back to 100042m day total.    . nitroGLYCERIN (NITROSTAT) 0.4 MG SL tablet Place under the tongue.    . prochlorperazine (COMPAZINE) 10 MG tablet Take 1 tablet (10 mg total) by mouth every 6 (six) hours as needed for nausea or vomiting. 30 tablet 2  . rosuvastatin (CRESTOR) 20 MG tablet HELD 09/05/15    . sitaGLIPtin (JANUVIA) 100 MG tablet Take 1 tablet (100 mg total) by mouth daily. 30 tablet 12  . warfarin (COUMADIN) 5 MG tablet 7.5mg78m Monday, Wednesday, and Friday; 5mg 37mry remaining day 30 tablet 5  . Blood Glucose Monitoring Suppl (ONE TOUCH ULTRA SYSTEM KIT) W/DEVICE KIT Check blood sugar once daily and as directed. Dx E11.65 1 each 0   No current facility-administered medications for this visit.    OBJECTIVE: Filed Vitals:   09/27/15 1106  BP: 121/71  Pulse: 67  Temp: 97.7 F (36.5 C)  Resp: 18     Body mass index is 26.17 kg/(m^2).    ECOG FS:1 - Symptomatic but completely ambulatory  General: Well-developed, well-nourished, no acute distress. Eyes: Pink conjunctiva, anicteric sclera. Lungs: Clear to auscultation bilaterally. Heart: Regular rate and rhythm. No rubs, murmurs, or gallops. Abdomen: Soft, nontender, nondistended. No organomegaly noted, normoactive bowel sounds. Musculoskeletal: Minimal bilateral edema, mild erythema of bilateral ankles. Neuro: Alert, answering all questions appropriately. Cranial nerves grossly intact. Skin: No rashes or petechiae noted. Psych: Normal affect.    LAB RESULTS:  Lab Results  Component Value Date   NA  136 09/27/2015   K 3.7 09/27/2015   CL 105 09/27/2015   CO2 25 09/27/2015   GLUCOSE 229* 09/27/2015   BUN 9 09/27/2015   CREATININE 0.62 09/27/2015   CALCIUM 8.5* 09/27/2015   PROT 6.1* 09/27/2015   ALBUMIN 3.3* 09/27/2015   AST 14* 09/27/2015   ALT 12* 09/27/2015   ALKPHOS 59 09/27/2015   BILITOT 0.2* 09/27/2015   GFRNONAA >60 09/27/2015   GFRAA >60 09/27/2015    Lab Results  Component Value Date   WBC 4.4 09/27/2015   NEUTROABS 2.9 09/27/2015   HGB 10.3* 09/27/2015   HCT 32.1* 09/27/2015   MCV 81.6 09/27/2015   PLT 129* 09/27/2015     STUDIES: Nm Pet Image Restag (ps) Skull Base To Thigh  09/09/2015  CLINICAL DATA:  Subsequent treatment strategy for pancreatic cancer with  liver metastasis. EXAM: NUCLEAR MEDICINE PET SKULL BASE TO THIGH TECHNIQUE: 13.1 mCi F-18 FDG was injected intravenously. Full-ring PET imaging was performed from the skull base to thigh after the radiotracer. CT data was obtained and used for attenuation correction and anatomic localization. FASTING BLOOD GLUCOSE:  Value: 99 mg/dl COMPARISON:  PET-CT dated 06/20/2015. MRI abdomen dated 06/01/2015. CT abdomen pelvis dated 05/31/2015. FINDINGS: NECK No hypermetabolic lymph nodes in the neck. CHEST No suspicious pulmonary nodules. Mild dependent atelectasis in the bilateral lower lobes. Cardiomegaly. No pericardial effusion. Coronary atherosclerosis. Atherosclerotic calcifications of the aortic arch. Right chest port terminates at the cavoatrial junction. No hypermetabolic thoracic lymphadenopathy. ABDOMEN/PELVIS 3.1 x 3.4 cm predominantly solid mass along the pancreatic body (series 3/ image 146), max SUV 7.2, previously 3.3. Adjacent 2.8 x 3.5 cm necrotic mass along the inferior aspect of the pancreatic head/body, max SUV 5.3, previously 3.3. 12 mm short axis node along the superior aspect of the splenic vein (series 3/ image 140), max SUV 5.8, previously 3.3. Prior focal hypermetabolism along the anterior right  hepatic dome, corresponding to metastasis on prior MRI, is not evident on the current PET. SKELETON No focal hypermetabolic activity to suggest skeletal metastasis. IMPRESSION: Increased hypermetabolism associated with the pancreatic mass and associated regional adenopathy, as described above. Metastasis along the right hepatic dome is not evident on the current PET. Otherwise, no evidence of distant metastases. Electronically Signed   By: Julian Hy M.D.   On: 09/09/2015 12:42    ASSESSMENT: Stage IV adenocarcinoma the pancreas.  PLAN:    1. Pancreatic cancer: PET scan results from September 09, 2015 reviewed independently and reported as above with improvement of disease burden and resolution of presumed liver metastasis. Despite this, patient's CA-19-9 has trended up slightly from 378-421. Proceed with cycle 4, day 15 of gemcitabine and Abraxane tomorrow. Patient will receive this regimen on days 1, 8, and 15 with day 22 off. Patient had consultation with surgery at [facility] who recommended completing 3 additional cycles of chemotherapy and then reimage prior to considering surgery. Return to clinic in 2 weeks for consideration of cycle 5, day 1. 2. Thrombocytopenia: Mild, secondary chemotherapy. Monitor.  3. Anemia: Mild, monitor. 4. Hyperglycemia:  Continue diabetic medications as prescribed.  5. DVTs: Likely secondary to decreased activity and malignancy. Continue Coumadin. Patient's INR levels are monitored by his primary care. Goal INR 2.0-3.0. 6. Leg pain: Possibly secondary to deconditioning. Patient was offered physical therapy with the [facility] program, but he has refused.  Patient expressed understanding and was in agreement with this plan. He also understands that He can call clinic at any time with any questions, concerns, or complaints.   Lloyd Huger, MD   10/09/2015 8:37 AM

## 2015-10-11 ENCOUNTER — Inpatient Hospital Stay (HOSPITAL_BASED_OUTPATIENT_CLINIC_OR_DEPARTMENT_OTHER): Payer: PPO | Admitting: Oncology

## 2015-10-11 ENCOUNTER — Inpatient Hospital Stay: Payer: PPO | Attending: Oncology

## 2015-10-11 ENCOUNTER — Other Ambulatory Visit: Payer: Self-pay

## 2015-10-11 ENCOUNTER — Inpatient Hospital Stay: Payer: PPO

## 2015-10-11 VITALS — BP 151/81 | HR 69 | Temp 97.6°F | Wt 169.1 lb

## 2015-10-11 DIAGNOSIS — E1165 Type 2 diabetes mellitus with hyperglycemia: Secondary | ICD-10-CM | POA: Insufficient documentation

## 2015-10-11 DIAGNOSIS — C787 Secondary malignant neoplasm of liver and intrahepatic bile duct: Secondary | ICD-10-CM | POA: Insufficient documentation

## 2015-10-11 DIAGNOSIS — Z86718 Personal history of other venous thrombosis and embolism: Secondary | ICD-10-CM | POA: Insufficient documentation

## 2015-10-11 DIAGNOSIS — D649 Anemia, unspecified: Secondary | ICD-10-CM

## 2015-10-11 DIAGNOSIS — Z794 Long term (current) use of insulin: Secondary | ICD-10-CM | POA: Insufficient documentation

## 2015-10-11 DIAGNOSIS — Z7901 Long term (current) use of anticoagulants: Secondary | ICD-10-CM | POA: Insufficient documentation

## 2015-10-11 DIAGNOSIS — R531 Weakness: Secondary | ICD-10-CM | POA: Insufficient documentation

## 2015-10-11 DIAGNOSIS — M79605 Pain in left leg: Secondary | ICD-10-CM | POA: Diagnosis not present

## 2015-10-11 DIAGNOSIS — I2510 Atherosclerotic heart disease of native coronary artery without angina pectoris: Secondary | ICD-10-CM | POA: Insufficient documentation

## 2015-10-11 DIAGNOSIS — R110 Nausea: Secondary | ICD-10-CM

## 2015-10-11 DIAGNOSIS — E785 Hyperlipidemia, unspecified: Secondary | ICD-10-CM | POA: Diagnosis not present

## 2015-10-11 DIAGNOSIS — I10 Essential (primary) hypertension: Secondary | ICD-10-CM | POA: Diagnosis not present

## 2015-10-11 DIAGNOSIS — R5381 Other malaise: Secondary | ICD-10-CM

## 2015-10-11 DIAGNOSIS — C259 Malignant neoplasm of pancreas, unspecified: Secondary | ICD-10-CM | POA: Diagnosis not present

## 2015-10-11 DIAGNOSIS — M791 Myalgia: Secondary | ICD-10-CM

## 2015-10-11 DIAGNOSIS — R5383 Other fatigue: Secondary | ICD-10-CM | POA: Diagnosis not present

## 2015-10-11 DIAGNOSIS — M79604 Pain in right leg: Secondary | ICD-10-CM

## 2015-10-11 DIAGNOSIS — Z87442 Personal history of urinary calculi: Secondary | ICD-10-CM | POA: Insufficient documentation

## 2015-10-11 DIAGNOSIS — I252 Old myocardial infarction: Secondary | ICD-10-CM | POA: Insufficient documentation

## 2015-10-11 DIAGNOSIS — T451X5S Adverse effect of antineoplastic and immunosuppressive drugs, sequela: Secondary | ICD-10-CM | POA: Diagnosis not present

## 2015-10-11 DIAGNOSIS — Z87891 Personal history of nicotine dependence: Secondary | ICD-10-CM | POA: Diagnosis not present

## 2015-10-11 DIAGNOSIS — Z5111 Encounter for antineoplastic chemotherapy: Secondary | ICD-10-CM | POA: Insufficient documentation

## 2015-10-11 DIAGNOSIS — R319 Hematuria, unspecified: Secondary | ICD-10-CM | POA: Insufficient documentation

## 2015-10-11 DIAGNOSIS — C801 Malignant (primary) neoplasm, unspecified: Secondary | ICD-10-CM

## 2015-10-11 DIAGNOSIS — Z8042 Family history of malignant neoplasm of prostate: Secondary | ICD-10-CM | POA: Insufficient documentation

## 2015-10-11 DIAGNOSIS — Z800 Family history of malignant neoplasm of digestive organs: Secondary | ICD-10-CM | POA: Diagnosis not present

## 2015-10-11 DIAGNOSIS — D6959 Other secondary thrombocytopenia: Secondary | ICD-10-CM | POA: Diagnosis not present

## 2015-10-11 DIAGNOSIS — Z7984 Long term (current) use of oral hypoglycemic drugs: Secondary | ICD-10-CM | POA: Diagnosis not present

## 2015-10-11 DIAGNOSIS — G4700 Insomnia, unspecified: Secondary | ICD-10-CM | POA: Insufficient documentation

## 2015-10-11 LAB — CBC WITH DIFFERENTIAL/PLATELET
BASOS PCT: 1 %
Basophils Absolute: 0.1 10*3/uL (ref 0–0.1)
Eosinophils Absolute: 0.1 10*3/uL (ref 0–0.7)
Eosinophils Relative: 2 %
HEMATOCRIT: 31.3 % — AB (ref 40.0–52.0)
HEMOGLOBIN: 10.2 g/dL — AB (ref 13.0–18.0)
LYMPHS ABS: 0.8 10*3/uL — AB (ref 1.0–3.6)
Lymphocytes Relative: 16 %
MCH: 27 pg (ref 26.0–34.0)
MCHC: 32.6 g/dL (ref 32.0–36.0)
MCV: 82.7 fL (ref 80.0–100.0)
MONO ABS: 0.6 10*3/uL (ref 0.2–1.0)
MONOS PCT: 12 %
NEUTROS ABS: 3.4 10*3/uL (ref 1.4–6.5)
NEUTROS PCT: 69 %
Platelets: 279 10*3/uL (ref 150–440)
RBC: 3.79 MIL/uL — ABNORMAL LOW (ref 4.40–5.90)
RDW: 19.7 % — AB (ref 11.5–14.5)
WBC: 4.9 10*3/uL (ref 3.8–10.6)

## 2015-10-11 LAB — COMPREHENSIVE METABOLIC PANEL
ALK PHOS: 59 U/L (ref 38–126)
ALT: 8 U/L — ABNORMAL LOW (ref 17–63)
ANION GAP: 3 — AB (ref 5–15)
AST: 11 U/L — ABNORMAL LOW (ref 15–41)
Albumin: 3.3 g/dL — ABNORMAL LOW (ref 3.5–5.0)
BILIRUBIN TOTAL: 0.4 mg/dL (ref 0.3–1.2)
BUN: 11 mg/dL (ref 6–20)
CALCIUM: 8.4 mg/dL — AB (ref 8.9–10.3)
CO2: 26 mmol/L (ref 22–32)
Chloride: 107 mmol/L (ref 101–111)
Creatinine, Ser: 0.61 mg/dL (ref 0.61–1.24)
GFR calc Af Amer: >60 mL/min (ref >60)
GFR calc non Af Amer: >60 mL/min (ref >60)
GLUCOSE: 311 mg/dL — AB (ref 65–99)
POTASSIUM: 3.8 mmol/L (ref 3.5–5.1)
Sodium: 136 mmol/L (ref 135–145)
Total Protein: 6 g/dL — ABNORMAL LOW (ref 6.5–8.1)

## 2015-10-11 MED ORDER — SODIUM CHLORIDE 0.9 % IV SOLN
Freq: Once | INTRAVENOUS | Status: AC
  Administered 2015-10-11: 10:00:00 via INTRAVENOUS
  Filled 2015-10-11: qty 1000

## 2015-10-11 MED ORDER — PACLITAXEL PROTEIN-BOUND CHEMO INJECTION 100 MG
125.0000 mg/m2 | Freq: Once | INTRAVENOUS | Status: AC
  Administered 2015-10-11: 250 mg via INTRAVENOUS
  Filled 2015-10-11: qty 50

## 2015-10-11 MED ORDER — HEPARIN SOD (PORK) LOCK FLUSH 100 UNIT/ML IV SOLN
500.0000 [IU] | Freq: Once | INTRAVENOUS | Status: AC | PRN
  Administered 2015-10-11: 500 [IU]
  Filled 2015-10-11: qty 5

## 2015-10-11 MED ORDER — ZOLPIDEM TARTRATE 10 MG PO TABS: 10.0000 mg | ORAL_TABLET | Freq: Every evening | ORAL | Status: DC | PRN

## 2015-10-11 MED ORDER — SITAGLIPTIN PHOSPHATE 100 MG PO TABS: 100.0000 mg | ORAL_TABLET | Freq: Every day | ORAL | Status: DC

## 2015-10-11 MED ORDER — SODIUM CHLORIDE 0.9 % IV SOLN
Freq: Once | INTRAVENOUS | Status: AC
  Administered 2015-10-11: 10:00:00 via INTRAVENOUS
  Filled 2015-10-11: qty 4

## 2015-10-11 MED ORDER — GEMCITABINE HCL CHEMO INJECTION 1 GM/26.3ML
2000.0000 mg | Freq: Once | INTRAVENOUS | Status: AC
  Administered 2015-10-11: 2000 mg via INTRAVENOUS
  Filled 2015-10-11: qty 52.6

## 2015-10-11 MED ORDER — SODIUM CHLORIDE 0.9 % IJ SOLN
10.0000 mL | INTRAMUSCULAR | Status: DC | PRN
  Administered 2015-10-11: 10 mL
  Filled 2015-10-11: qty 10

## 2015-10-11 NOTE — Progress Notes (Signed)
[place]  Telephone:(336) [phone] Fax:(336) [phone]  ID: Ronnie Johnson OB: 1940/11/26  MR#: [id number]  ATF#:[id number]  Patient Care Team: Tonia Ghent, MD as PCP - General (Family Medicine) Hessie Knows, MD as Referring Physician (Orthopedic Surgery) Clent Jacks, RN as Registered Nurse  CHIEF COMPLAINT:  Chief Complaint  Patient presents with  . Pancreatic Cancer    INTERVAL HISTORY: Patient returns to clinic today for consideration of cycle 5, day 1 of gemcitabine and Abraxane. His fatigue has improved. He continues to have bilateral upper leg pain particularly with walking.  He also complains of trouble sleeping. He has no neurologic complaints. He denies any easy bleeding or bruising. He denies any fevers. He denies any chest pain or shortness of breath. He has occasional nausea, but denies any vomiting, constipation, or diarrhea. He has no urinary complaints. Patient offers no further specific complaints today.  REVIEW OF SYSTEMS:   Review of Systems  Constitutional: Positive for malaise/fatigue. Negative for fever and weight loss.  Eyes: Negative for pain, discharge and redness.  Cardiovascular: Negative for leg swelling.  Gastrointestinal: Positive for nausea. Negative for abdominal pain.  Musculoskeletal: Positive for myalgias.  Neurological: Positive for weakness.  Endo/Heme/Allergies: Does not bruise/bleed easily.  Psychiatric/Behavioral: The patient has insomnia.     As per HPI. Otherwise, a complete review of systems is negatve.  PAST MEDICAL HISTORY: Past Medical History  Diagnosis Date  . Hypertension   . DVT of leg (deep venous thrombosis) ([place]) 2009  . Nephrolithiasis   . Hyperlipidemia   . Allergic rhinitis   . CAD (coronary artery disease)   . Urethral diverticulum   . Varicose vein of leg   . Dupuytren's contracture   . ED (erectile dysfunction)   . History of colonic polyps   . GERD (gastroesophageal reflux  disease)   . Diverticulosis of colon   . Myocardial infarction ([place])   . DVT (deep venous thrombosis) ([place])   . Polio 1948    was hospitalized for 1 year  . Diabetes mellitus, type 2 ([place])     Patient takes Metformin.  . SCC (squamous cell carcinoma)     scalp 2013 per derm    PAST SURGICAL HISTORY: Past Surgical History  Procedure Laterality Date  . Appendectomy  1994  . Umbilical hernia repair  03/14/2005  . 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 Replacement total knee Left 2016  . Peripheral vascular catheterization N/A 06/23/2015    Procedure: [name];  Surgeon: Algernon Huxley, MD;  Location: [facility];  Service: Cardiovascular;  Laterality: N/A;    FAMILY HISTORY Family History  Problem Relation Age of Onset  . Diabetes Mother   . Hypertension Mother   . Stroke Mother   . Alzheimer's disease Mother   . Heart attack Father   . Hypertension Father   . Prostate cancer Brother   . Diabetes Brother   . Hypertension Brother   . Diabetes Sister   . Colon cancer Neg Hx   . Hypertension Daughter        ADVANCED DIRECTIVES:    HEALTH MAINTENANCE: Social History  Substance Use Topics  . Smoking status: Former Smoker    Quit date: 10/08/1988  . Smokeless tobacco: Never Used  . Alcohol Use: 0.0 oz/week    0 Standard drinks or equivalent per week     Comment: RARE     Colonoscopy:  PAP:  Bone  density:  Lipid panel:  Allergies  Allergen Reactions  . Glimepiride Other (See Comments)    Leg stiffness  . Lipitor [Atorvastatin Calcium]     Tolerates crestor  . Oxycodone Other (See Comments)    Altered mental state  . Tape   . Latex Rash    Current Outpatient Prescriptions  Medication Sig Dispense Refill  . Blood Glucose Monitoring Suppl (ONE TOUCH ULTRA SYSTEM KIT) W/DEVICE KIT Check blood sugar once daily and as directed. Dx E11.65 1 each 0  . carvedilol (COREG) 6.25 MG tablet Take 1 tablet (6.25 mg total) by mouth 2 (two)  times daily with a meal. 180 tablet 3  . esomeprazole (NEXIUM) 20 MG capsule Take 20 mg by mouth daily as needed (HEARTBURN).    . Insulin Glargine (LANTUS SOLOSTAR) 100 UNIT/ML Solostar Pen Inject 5 units once a day.  Can add 1 unit per day if AM sugar is >150. 5 pen PRN  . lidocaine-prilocaine (EMLA) cream Apply 1 application topically as needed. Apply to port 1 hour prior to chemotherapy appointment, cover with plastic wrap. 30 g 2  . lisinopril (PRINIVIL,ZESTRIL) 10 MG tablet Take 1 tablet (10 mg total) by mouth daily. 90 tablet 3  . metFORMIN (GLUCOPHAGE) 1000 MG tablet Take up to 1023m in the AM and 5086min the PM.  If GI upset, cut back to 100039m day total.    . nitroGLYCERIN (NITROSTAT) 0.4 MG SL tablet Place under the tongue.    . prochlorperazine (COMPAZINE) 10 MG tablet Take 1 tablet (10 mg total) by mouth every 6 (six) hours as needed for nausea or vomiting. 30 tablet 2  . rosuvastatin (CRESTOR) 20 MG tablet HELD 09/05/15    . sitaGLIPtin (JANUVIA) 100 MG tablet Take 1 tablet (100 mg total) by mouth daily. 30 tablet 12  . warfarin (COUMADIN) 5 MG tablet 7.5mg63m Monday, Wednesday, and Friday; 5mg 89mry remaining day 30 tablet 5  . zolpidem (AMBIEN) 10 MG tablet Take 1 tablet (10 mg total) by mouth at bedtime as needed for sleep. 30 tablet 0   No current facility-administered medications for this visit.   Facility-Administered Medications Ordered in Other Visits  Medication Dose Route Frequency Provider Last Rate Last Dose  . sodium chloride 0.9 % injection 10 mL  10 mL Intracatheter PRN TimotLloyd Huger  10 mL at 10/11/15 0930    OBJECTIVE: Filed Vitals:   10/11/15 0911  BP: 151/81  Pulse: 69  Temp: 97.6 F (36.4 C)     Body mass index is 26.48 kg/(m^2).    ECOG FS:1 - Symptomatic but completely ambulatory  General: Well-developed, well-nourished, no acute distress. Eyes: Pink conjunctiva, anicteric sclera. Lungs: Clear to auscultation bilaterally. Heart:  Regular rate and rhythm. No rubs, murmurs, or gallops. Abdomen: Soft, nontender, nondistended. No organomegaly noted, normoactive bowel sounds. Musculoskeletal: Minimal bilateral edema, mild erythema of bilateral ankles. Neuro: Alert, answering all questions appropriately. Cranial nerves grossly intact. Skin: No rashes or petechiae noted. Psych: Normal affect.    LAB RESULTS:  Lab Results  Component Value Date   NA 136 10/11/2015   K 3.8 10/11/2015   CL 107 10/11/2015   CO2 26 10/11/2015   GLUCOSE 311* 10/11/2015   BUN 11 10/11/2015   CREATININE 0.61 10/11/2015   CALCIUM 8.4* 10/11/2015   PROT 6.0* 10/11/2015   ALBUMIN 3.3* 10/11/2015   AST 11* 10/11/2015   ALT 8* 10/11/2015   ALKPHOS 59 10/11/2015   BILITOT 0.4 10/11/2015  GFRNONAA >60 10/11/2015   GFRAA >60 10/11/2015    Lab Results  Component Value Date   WBC 4.9 10/11/2015   NEUTROABS 3.4 10/11/2015   HGB 10.2* 10/11/2015   HCT 31.3* 10/11/2015   MCV 82.7 10/11/2015   PLT 279 10/11/2015     STUDIES: No results found.  ASSESSMENT: Stage IV adenocarcinoma the pancreas.  PLAN:    1. Pancreatic cancer: PET scan results from September 09, 2015 reviewed independently and reported with improvement of disease burden and resolution of presumed liver metastasis. Despite this, patient's CA-19-9 has trended up slightly from 378-421. Proceed with cycle 5, day 1 of gemcitabine and Abraxane today. Patient will receive this regimen on days 1, 8, and 15 with day 22 off. Patient had consultation with surgery at [facility] who recommended completing 3 additional cycles of chemotherapy and then reimage prior to considering surgery. Return to clinic in 1 week for consideration of cycle 5, day 8. 2. Thrombocytopenia: Resolved, secondary chemotherapy. Monitor.  3. Anemia: Mild, monitor. 4. Hyperglycemia:  Continue diabetic medications as prescribed.  5. DVTs: Likely secondary to decreased activity and malignancy. Continue  Coumadin. Patient's INR levels are monitored by his primary care. Goal INR 2.0-3.0. 6. Leg pain: Possibly secondary to deconditioning. Patient was offered physical therapy with the [facility] program, but he has refused. 7. Insomnia: Patient was given a prescription for Ambien today.  Patient expressed understanding and was in agreement with this plan. He also understands that He can call clinic at any time with any questions, concerns, or complaints.   Lloyd Huger, MD   10/11/2015 1:19 PM

## 2015-10-11 NOTE — Telephone Encounter (Signed)
Pt is wanting 90 day refill for Januvia to [employer] order. Can no longer get at local pharmacy due to ins coverage and pt is out of med. Izora Gala request sent electronically to [facility]. Pt last seen 06/28/15. Advised Nancy refill done.

## 2015-10-11 NOTE — Progress Notes (Signed)
Patient is having trouble sleeping and requesting a medication.

## 2015-10-12 ENCOUNTER — Other Ambulatory Visit: Payer: Self-pay | Admitting: Internal Medicine

## 2015-10-13 ENCOUNTER — Ambulatory Visit (INDEPENDENT_AMBULATORY_CARE_PROVIDER_SITE_OTHER): Payer: PPO | Admitting: *Deleted

## 2015-10-13 ENCOUNTER — Telehealth: Payer: Self-pay | Admitting: *Deleted

## 2015-10-13 ENCOUNTER — Ambulatory Visit (INDEPENDENT_AMBULATORY_CARE_PROVIDER_SITE_OTHER): Payer: PPO | Admitting: Family Medicine

## 2015-10-13 ENCOUNTER — Encounter: Payer: Self-pay | Admitting: Family Medicine

## 2015-10-13 VITALS — BP 106/54 | HR 98 | Temp 98.5°F | Wt 167.0 lb

## 2015-10-13 DIAGNOSIS — R0789 Other chest pain: Secondary | ICD-10-CM | POA: Diagnosis not present

## 2015-10-13 DIAGNOSIS — Z5181 Encounter for therapeutic drug level monitoring: Secondary | ICD-10-CM

## 2015-10-13 DIAGNOSIS — I82403 Acute embolism and thrombosis of unspecified deep veins of lower extremity, bilateral: Secondary | ICD-10-CM | POA: Diagnosis not present

## 2015-10-13 LAB — POCT INR: INR: 2.2

## 2015-10-13 MED ORDER — ROSUVASTATIN CALCIUM 20 MG PO TABS: 10.0000 mg | ORAL_TABLET | Freq: Every day | ORAL | Status: DC

## 2015-10-13 NOTE — Progress Notes (Signed)
Pre visit review using our clinic review tool, if applicable. No additional management support is needed unless otherwise documented below in the visit note.  On coumadin.  Known cancer dx.  Yesterday he was out gathering wood.  Went to bed last night and then had L sided chest pain at rest.  Pain most of the night, at rest then resolved this AM.  No sx now.  Didn't have trouble yesterday gathering wood.  Not SOB.  The pain was bad enough to wake him up, not severe enough to go to ER.  INR 2.2 this AM.  No bleeding.  Some B leg swelling at baseline.  Known h/o DVT.  H/o B thigh pain at baseline, may have been some better off statin.    CP free now.   PMH and SH reviewed  ROS: See HPI, otherwise noncontributory.  Meds, vitals, and allergies reviewed.   Nad ncat Bald Mmm OP wnl Neck supple no LA rrr ctab No acute rash on the chest.  L chest wall ttp anteriorly but R chest wall not ttp No bruising abd soft Ext with compression stockings noted, no edema L leg, 1+ edema R leg (R leg tends to swell more at baseline per patient report)

## 2015-10-13 NOTE — Telephone Encounter (Signed)
Pharmacy is faxing the PA info.

## 2015-10-13 NOTE — Telephone Encounter (Signed)
Ambien needs PA. Asking we get it approved or that we call in something else

## 2015-10-13 NOTE — Patient Instructions (Addendum)
Stop the crestor and see if the leg aches get better (the thigh pain in both legs). It looks like you pulled a muscle in your chest.   Take tylenol as needed.   Your EKG looks fine.  Take care.  Glad to see you.

## 2015-10-13 NOTE — Progress Notes (Signed)
Pre visit review using our clinic review tool, if applicable. No additional management support is needed unless otherwise documented below in the visit note. 

## 2015-10-14 ENCOUNTER — Inpatient Hospital Stay: Payer: PPO

## 2015-10-14 ENCOUNTER — Telehealth: Payer: Self-pay | Admitting: *Deleted

## 2015-10-14 DIAGNOSIS — R319 Hematuria, unspecified: Secondary | ICD-10-CM

## 2015-10-14 DIAGNOSIS — Z5111 Encounter for antineoplastic chemotherapy: Secondary | ICD-10-CM | POA: Diagnosis not present

## 2015-10-14 LAB — URINALYSIS COMPLETE WITH MICROSCOPIC (ARMC ONLY)
Bacteria, UA: NONE SEEN
Bilirubin Urine: NEGATIVE
GLUCOSE, UA: NEGATIVE mg/dL
Leukocytes, UA: NEGATIVE
Nitrite: NEGATIVE
Protein, ur: 30 mg/dL — AB
SQUAMOUS EPITHELIAL / LPF: NONE SEEN
Specific Gravity, Urine: 1.024 (ref 1.005–1.030)
pH: 5 (ref 5.0–8.0)

## 2015-10-14 NOTE — Telephone Encounter (Signed)
Ambien needs prior-auth and Tillie Rung is working on it.  Get a u/a today.  Thanks.

## 2015-10-14 NOTE — Telephone Encounter (Signed)
Passing bright red blood in urine this morning. Also states Ambien needs PA

## 2015-10-14 NOTE — Assessment & Plan Note (Signed)
Likely cause of atypical chest pain.  No sx now except on palpation.  EKG w/o sig change, dw pt. Unlikely to be PE given the adequate anticoagulation.  D/w pt.  Still okay for outpatient f/u.   crestor could be exacerbating thigh and possibly chest wall pain.  Would stop for now. D/w pt.  Tylenol prn.  Update me as needed.  He agrees.  >25 minutes spent in face to face time with patient, >50% spent in counselling or coordination of care.

## 2015-10-14 NOTE — Telephone Encounter (Signed)
Returned call pt will ocme in at 1115 for lab

## 2015-10-15 LAB — URINE CULTURE: Culture: NO GROWTH

## 2015-10-18 ENCOUNTER — Inpatient Hospital Stay: Payer: PPO

## 2015-10-18 ENCOUNTER — Inpatient Hospital Stay (HOSPITAL_BASED_OUTPATIENT_CLINIC_OR_DEPARTMENT_OTHER): Payer: PPO | Admitting: Oncology

## 2015-10-18 VITALS — BP 139/67 | HR 73 | Temp 96.9°F | Wt 164.9 lb

## 2015-10-18 DIAGNOSIS — M79605 Pain in left leg: Secondary | ICD-10-CM

## 2015-10-18 DIAGNOSIS — Z7984 Long term (current) use of oral hypoglycemic drugs: Secondary | ICD-10-CM

## 2015-10-18 DIAGNOSIS — R319 Hematuria, unspecified: Secondary | ICD-10-CM | POA: Diagnosis not present

## 2015-10-18 DIAGNOSIS — C259 Malignant neoplasm of pancreas, unspecified: Secondary | ICD-10-CM

## 2015-10-18 DIAGNOSIS — C787 Secondary malignant neoplasm of liver and intrahepatic bile duct: Secondary | ICD-10-CM | POA: Diagnosis not present

## 2015-10-18 DIAGNOSIS — M791 Myalgia: Secondary | ICD-10-CM

## 2015-10-18 DIAGNOSIS — R531 Weakness: Secondary | ICD-10-CM

## 2015-10-18 DIAGNOSIS — D649 Anemia, unspecified: Secondary | ICD-10-CM | POA: Diagnosis not present

## 2015-10-18 DIAGNOSIS — E1165 Type 2 diabetes mellitus with hyperglycemia: Secondary | ICD-10-CM

## 2015-10-18 DIAGNOSIS — R5383 Other fatigue: Secondary | ICD-10-CM

## 2015-10-18 DIAGNOSIS — Z794 Long term (current) use of insulin: Secondary | ICD-10-CM

## 2015-10-18 DIAGNOSIS — R5381 Other malaise: Secondary | ICD-10-CM

## 2015-10-18 DIAGNOSIS — G4700 Insomnia, unspecified: Secondary | ICD-10-CM

## 2015-10-18 DIAGNOSIS — M79604 Pain in right leg: Secondary | ICD-10-CM

## 2015-10-18 DIAGNOSIS — Z5111 Encounter for antineoplastic chemotherapy: Secondary | ICD-10-CM | POA: Diagnosis not present

## 2015-10-18 LAB — COMPREHENSIVE METABOLIC PANEL
ALBUMIN: 3.2 g/dL — AB (ref 3.5–5.0)
ALT: 9 U/L — AB (ref 17–63)
AST: 11 U/L — AB (ref 15–41)
Alkaline Phosphatase: 64 U/L (ref 38–126)
Anion gap: 5 (ref 5–15)
BUN: 12 mg/dL (ref 6–20)
CHLORIDE: 105 mmol/L (ref 101–111)
CO2: 27 mmol/L (ref 22–32)
CREATININE: 0.66 mg/dL (ref 0.61–1.24)
Calcium: 8.5 mg/dL — ABNORMAL LOW (ref 8.9–10.3)
GFR calc Af Amer: >60 mL/min (ref >60)
GFR calc non Af Amer: >60 mL/min (ref >60)
Glucose, Bld: 283 mg/dL — ABNORMAL HIGH (ref 65–99)
POTASSIUM: 3.7 mmol/L (ref 3.5–5.1)
SODIUM: 137 mmol/L (ref 135–145)
Total Bilirubin: 0.3 mg/dL (ref 0.3–1.2)
Total Protein: 6.3 g/dL — ABNORMAL LOW (ref 6.5–8.1)

## 2015-10-18 LAB — CBC WITH DIFFERENTIAL/PLATELET
BASOS ABS: 0 10*3/uL (ref 0–0.1)
BASOS PCT: 1 %
EOS ABS: 0 10*3/uL (ref 0–0.7)
EOS PCT: 1 %
HCT: 30.7 % — ABNORMAL LOW (ref 40.0–52.0)
Hemoglobin: 9.9 g/dL — ABNORMAL LOW (ref 13.0–18.0)
LYMPHS PCT: 22 %
Lymphs Abs: 1 10*3/uL (ref 1.0–3.6)
MCH: 27.1 pg (ref 26.0–34.0)
MCHC: 32.2 g/dL (ref 32.0–36.0)
MCV: 84.4 fL (ref 80.0–100.0)
MONO ABS: 0.5 10*3/uL (ref 0.2–1.0)
Monocytes Relative: 10 %
Neutro Abs: 3 10*3/uL (ref 1.4–6.5)
Neutrophils Relative %: 66 %
PLATELETS: 208 10*3/uL (ref 150–440)
RBC: 3.64 MIL/uL — AB (ref 4.40–5.90)
RDW: 18.8 % — AB (ref 11.5–14.5)
WBC: 4.5 10*3/uL (ref 3.8–10.6)

## 2015-10-18 MED ORDER — HEPARIN SOD (PORK) LOCK FLUSH 100 UNIT/ML IV SOLN
500.0000 [IU] | Freq: Once | INTRAVENOUS | Status: AC
  Administered 2015-10-18: 500 [IU] via INTRAVENOUS
  Filled 2015-10-18: qty 5

## 2015-10-18 MED ORDER — PACLITAXEL PROTEIN-BOUND CHEMO INJECTION 100 MG
125.0000 mg/m2 | Freq: Once | INTRAVENOUS | Status: AC
  Administered 2015-10-18: 250 mg via INTRAVENOUS
  Filled 2015-10-18: qty 50

## 2015-10-18 MED ORDER — SODIUM CHLORIDE 0.9 % IJ SOLN
10.0000 mL | INTRAMUSCULAR | Status: DC | PRN
  Administered 2015-10-18: 10 mL via INTRAVENOUS
  Filled 2015-10-18: qty 10

## 2015-10-18 MED ORDER — GEMCITABINE HCL CHEMO INJECTION 1 GM/26.3ML
2000.0000 mg | Freq: Once | INTRAVENOUS | Status: AC
  Administered 2015-10-18: 2000 mg via INTRAVENOUS
  Filled 2015-10-18: qty 52.6

## 2015-10-18 MED ORDER — SODIUM CHLORIDE 0.9 % IV SOLN
Freq: Once | INTRAVENOUS | Status: AC
  Administered 2015-10-18: 11:00:00 via INTRAVENOUS
  Filled 2015-10-18: qty 4

## 2015-10-18 MED ORDER — SODIUM CHLORIDE 0.9 % IV SOLN
Freq: Once | INTRAVENOUS | Status: AC
  Administered 2015-10-18: 10:00:00 via INTRAVENOUS
  Filled 2015-10-18: qty 1000

## 2015-10-18 NOTE — Progress Notes (Signed)
[place]  [phone] [phone]) [phone]  ID: Ronnie Johnson OB: 06-18-1941  MR#: [id number]  PQZ#:[id number]  Patient Care Team: Tonia Ghent, MD as PCP - General (Family Medicine) Hessie Knows, MD as Referring Physician (Orthopedic Surgery) Clent Jacks, RN as Registered Nurse  CHIEF COMPLAINT:  Chief Complaint  Patient presents with  . Pancreatic Cancer    follow up    INTERVAL HISTORY: Patient returns to clinic today for consideration of cycle 5, day 8 of gemcitabine and Abraxane. His fatigue has improved. He continues to have bilateral upper leg pain particularly with walking but states it is improving.  He also complains of trouble sleeping, but he was not able to pick up his zolpidem due to insurance approval. He has no neurologic complaints. He denies any easy bleeding or bruising. He denies any fevers. He denies any chest pain or shortness of breath. He denies nausea, vomiting, constipation, or diarrhea. He has noticed blood in his urine on two occassions. Patient offers no further specific complaints today.  REVIEW OF SYSTEMS:   Review of Systems  Constitutional: Positive for malaise/fatigue. Negative for fever and weight loss.  Eyes: Negative for pain, discharge and redness.  Cardiovascular: Negative for leg swelling.  Gastrointestinal: Negative for nausea and abdominal pain.  Genitourinary: Positive for hematuria.  Musculoskeletal: Positive for myalgias.  Neurological: Positive for weakness.  Endo/Heme/Allergies: Does not bruise/bleed easily.  Psychiatric/Behavioral: The patient has insomnia.     As per HPI. Otherwise, a complete review of systems is negatve.  PAST MEDICAL HISTORY: Past Medical History  Diagnosis Date  . Hypertension   . DVT of leg (deep venous thrombosis) ([place]) 2009  . Nephrolithiasis   . Hyperlipidemia   . Allergic rhinitis   . CAD (coronary artery disease)   . Urethral diverticulum   . Varicose  vein of leg   . Dupuytren's contracture   . ED (erectile dysfunction)   . History of colonic polyps   . GERD (gastroesophageal reflux disease)   . Diverticulosis of colon   . Myocardial infarction ([place])   . DVT (deep venous thrombosis) ([place])   . Polio 1948    was hospitalized for 1 year  . Diabetes mellitus, type 2 ([place])     Patient takes Metformin.  . SCC (squamous cell carcinoma)     scalp 2013 per derm    PAST SURGICAL HISTORY: Past Surgical History  Procedure Laterality Date  . Appendectomy  1994  . Umbilical hernia repair  03/14/2005  . 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 Replacement total knee Left 2016  . Peripheral vascular catheterization N/A 06/23/2015    Procedure: [name];  Surgeon: Algernon Huxley, MD;  Location: [facility];  Service: Cardiovascular;  Laterality: N/A;    FAMILY HISTORY Family History  Problem Relation Age of Onset  . Diabetes Mother   . Hypertension Mother   . Stroke Mother   . Alzheimer's disease Mother   . Heart attack Father   . Hypertension Father   . Prostate cancer Brother   . Diabetes Brother   . Hypertension Brother   . Diabetes Sister   . Colon cancer Neg Hx   . Hypertension Daughter        ADVANCED DIRECTIVES:    HEALTH MAINTENANCE: Social History  Substance Use Topics  . Smoking status: Former Smoker    Quit date: 10/08/1988  . Smokeless tobacco: Never Used  .  Alcohol Use: 0.0 oz/week    0 Standard drinks or equivalent per week     Comment: RARE     Colonoscopy:  PAP:  Bone density:  Lipid panel:  Allergies  Allergen Reactions  . Glimepiride Other (See Comments)    Leg stiffness  . Lipitor [Atorvastatin Calcium]     Tolerates crestor  . Oxycodone Other (See Comments)    Altered mental state  . Tape   . Latex Rash    Current Outpatient Prescriptions  Medication Sig Dispense Refill  . Blood Glucose Monitoring Suppl (ONE TOUCH ULTRA SYSTEM KIT) W/DEVICE KIT Check blood  sugar once daily and as directed. Dx E11.65 1 each 0  . carvedilol (COREG) 6.25 MG tablet Take 1 tablet (6.25 mg total) by mouth 2 (two) times daily with a meal. 180 tablet 3  . esomeprazole (NEXIUM) 20 MG capsule Take 20 mg by mouth daily as needed (HEARTBURN).    . Insulin Glargine (LANTUS SOLOSTAR) 100 UNIT/ML Solostar Pen Inject 5 units once a day.  Can add 1 unit per day if AM sugar is >150. 5 pen PRN  . lidocaine-prilocaine (EMLA) cream Apply 1 application topically as needed. Apply to port 1 hour prior to chemotherapy appointment, cover with plastic wrap. 30 g 2  . lisinopril (PRINIVIL,ZESTRIL) 10 MG tablet Take 1 tablet (10 mg total) by mouth daily. 90 tablet 3  . metFORMIN (GLUCOPHAGE) 1000 MG tablet Take up to 1073m in the AM and 5038min the PM.  If GI upset, cut back to 100087m day total.    . nitroGLYCERIN (NITROSTAT) 0.4 MG SL tablet Place under the tongue.    . prochlorperazine (COMPAZINE) 10 MG tablet Take 1 tablet (10 mg total) by mouth every 6 (six) hours as needed for nausea or vomiting. 30 tablet 2  . sitaGLIPtin (JANUVIA) 100 MG tablet Take 1 tablet (100 mg total) by mouth daily. 90 tablet 1  . warfarin (COUMADIN) 5 MG tablet 7.5mg62m Monday, Wednesday, and Friday; 5mg 29mry remaining day 30 tablet 5  . zolpidem (AMBIEN) 10 MG tablet Take 1 tablet (10 mg total) by mouth at bedtime as needed for sleep. 30 tablet 0   No current facility-administered medications for this visit.   Facility-Administered Medications Ordered in Other Visits  Medication Dose Route Frequency Provider Last Rate Last Dose  . sodium chloride 0.9 % injection 10 mL  10 mL Intravenous PRN TimotLloyd Huger  10 mL at 10/18/15 0855    OBJECTIVE: Filed Vitals:   10/18/15 0916  BP: 139/67  Pulse: 73  Temp: 96.9 F (36.1 C)     Body mass index is 25.82 kg/(m^2).    ECOG FS:1 - Symptomatic but completely ambulatory  General: Well-developed, well-nourished, no acute distress. Eyes: Pink  conjunctiva, anicteric sclera. Lungs: Clear to auscultation bilaterally. Heart: Regular rate and rhythm. No rubs, murmurs, or gallops. Abdomen: Soft, nontender, nondistended. No organomegaly noted, normoactive bowel sounds. Musculoskeletal: Minimal bilateral edema, mild erythema of bilateral ankles. Neuro: Alert, answering all questions appropriately. Cranial nerves grossly intact. Skin: No rashes or petechiae noted. Psych: Normal affect.    LAB RESULTS:  Lab Results  Component Value Date   NA 137 10/18/2015   K 3.7 10/18/2015   CL 105 10/18/2015   CO2 27 10/18/2015   GLUCOSE 283* 10/18/2015   BUN 12 10/18/2015   CREATININE 0.66 10/18/2015   CALCIUM 8.5* 10/18/2015   PROT 6.3* 10/18/2015   ALBUMIN 3.2* 10/18/2015   AST  11* 10/18/2015   ALT 9* 10/18/2015   ALKPHOS 64 10/18/2015   BILITOT 0.3 10/18/2015   GFRNONAA >60 10/18/2015   GFRAA >60 10/18/2015    Lab Results  Component Value Date   WBC 4.5 10/18/2015   NEUTROABS 3.0 10/18/2015   HGB 9.9* 10/18/2015   HCT 30.7* 10/18/2015   MCV 84.4 10/18/2015   PLT 208 10/18/2015     STUDIES: No results found.  ASSESSMENT: Stage IV adenocarcinoma the pancreas.  PLAN:    1. Pancreatic cancer: PET scan results from September 09, 2015 reviewed independently and reported with improvement of disease burden and resolution of presumed liver metastasis. Despite this, patient's CA-19-9 has trended up slightly from 378-421. CA 19-9 drawn again today. Proceed with cycle 5, day 8 of gemcitabine and Abraxane today. Patient will receive this regimen on days 1, 8, and 15 with day 22 off. Patient had consultation with surgery at [facility] who recommended completing 3 additional cycles of chemotherapy and then reimage prior to considering surgery. Return to clinic in 1 week for consideration of cycle 5, day 15. 2. Thrombocytopenia: Resolved, secondary chemotherapy. Monitor.  3. Anemia: Mild, monitor. 4. Hyperglycemia:  Continue  diabetic medications as prescribed.  5. DVTs: Likely secondary to decreased activity and malignancy. Continue Coumadin. Patient's INR levels are monitored by his primary care. Goal INR 2.0-3.0. 6. Leg pain: Possibly secondary to deconditioning. Patient was offered physical therapy with the [facility] program, but he has refused. 7. Insomnia: Insurance approval for Ambien in progress. 8. Hematuria: Patient to get appt with his urologist at [facility].  Patient expressed understanding and was in agreement with this plan. He also understands that He can call clinic at any time with any questions, concerns, or complaints.   Mayra Reel, NP   10/18/2015 2:04 PM  Patient seen and evaluated independently and I agree with the assessment and plan as dictated above.   Lloyd Huger

## 2015-10-19 LAB — CANCER ANTIGEN 19-9: CA 19-9: 524 U/mL — ABNORMAL HIGH (ref 0–35)

## 2015-10-25 ENCOUNTER — Inpatient Hospital Stay: Payer: PPO

## 2015-10-25 ENCOUNTER — Inpatient Hospital Stay (HOSPITAL_BASED_OUTPATIENT_CLINIC_OR_DEPARTMENT_OTHER): Payer: PPO | Admitting: Oncology

## 2015-10-25 VITALS — BP 117/65 | HR 78 | Temp 96.3°F | Resp 16 | Wt 163.8 lb

## 2015-10-25 DIAGNOSIS — G4700 Insomnia, unspecified: Secondary | ICD-10-CM

## 2015-10-25 DIAGNOSIS — C787 Secondary malignant neoplasm of liver and intrahepatic bile duct: Secondary | ICD-10-CM

## 2015-10-25 DIAGNOSIS — R5381 Other malaise: Secondary | ICD-10-CM

## 2015-10-25 DIAGNOSIS — Z794 Long term (current) use of insulin: Secondary | ICD-10-CM

## 2015-10-25 DIAGNOSIS — C259 Malignant neoplasm of pancreas, unspecified: Secondary | ICD-10-CM

## 2015-10-25 DIAGNOSIS — Z5111 Encounter for antineoplastic chemotherapy: Secondary | ICD-10-CM | POA: Diagnosis not present

## 2015-10-25 DIAGNOSIS — R531 Weakness: Secondary | ICD-10-CM

## 2015-10-25 DIAGNOSIS — T451X5S Adverse effect of antineoplastic and immunosuppressive drugs, sequela: Secondary | ICD-10-CM | POA: Diagnosis not present

## 2015-10-25 DIAGNOSIS — R5383 Other fatigue: Secondary | ICD-10-CM

## 2015-10-25 DIAGNOSIS — D649 Anemia, unspecified: Secondary | ICD-10-CM

## 2015-10-25 DIAGNOSIS — E1165 Type 2 diabetes mellitus with hyperglycemia: Secondary | ICD-10-CM

## 2015-10-25 DIAGNOSIS — Z7984 Long term (current) use of oral hypoglycemic drugs: Secondary | ICD-10-CM

## 2015-10-25 DIAGNOSIS — D6959 Other secondary thrombocytopenia: Secondary | ICD-10-CM

## 2015-10-25 LAB — CBC WITH DIFFERENTIAL/PLATELET
BASOS PCT: 1 %
Basophils Absolute: 0 10*3/uL (ref 0–0.1)
EOS PCT: 1 %
Eosinophils Absolute: 0 10*3/uL (ref 0–0.7)
HEMATOCRIT: 33.3 % — AB (ref 40.0–52.0)
Hemoglobin: 10.7 g/dL — ABNORMAL LOW (ref 13.0–18.0)
Lymphocytes Relative: 21 %
Lymphs Abs: 0.9 10*3/uL — ABNORMAL LOW (ref 1.0–3.6)
MCH: 26.9 pg (ref 26.0–34.0)
MCHC: 32.1 g/dL (ref 32.0–36.0)
MCV: 83.9 fL (ref 80.0–100.0)
MONO ABS: 0.4 10*3/uL (ref 0.2–1.0)
MONOS PCT: 9 %
NEUTROS ABS: 2.8 10*3/uL (ref 1.4–6.5)
Neutrophils Relative %: 68 %
Platelets: 129 10*3/uL — ABNORMAL LOW (ref 150–440)
RBC: 3.96 MIL/uL — ABNORMAL LOW (ref 4.40–5.90)
RDW: 19.4 % — AB (ref 11.5–14.5)
WBC: 4.2 10*3/uL (ref 3.8–10.6)

## 2015-10-25 LAB — COMPREHENSIVE METABOLIC PANEL
ALBUMIN: 3.3 g/dL — AB (ref 3.5–5.0)
ALT: 12 U/L — ABNORMAL LOW (ref 17–63)
ANION GAP: 8 (ref 5–15)
AST: 12 U/L — ABNORMAL LOW (ref 15–41)
Alkaline Phosphatase: 50 U/L (ref 38–126)
BILIRUBIN TOTAL: 0.4 mg/dL (ref 0.3–1.2)
BUN: 11 mg/dL (ref 6–20)
CO2: 26 mmol/L (ref 22–32)
Calcium: 8.9 mg/dL (ref 8.9–10.3)
Chloride: 102 mmol/L (ref 101–111)
Creatinine, Ser: 0.59 mg/dL — ABNORMAL LOW (ref 0.61–1.24)
GFR calc Af Amer: >60 mL/min (ref >60)
GLUCOSE: 247 mg/dL — AB (ref 65–99)
POTASSIUM: 3.7 mmol/L (ref 3.5–5.1)
Sodium: 136 mmol/L (ref 135–145)
TOTAL PROTEIN: 6.3 g/dL — AB (ref 6.5–8.1)

## 2015-10-25 MED ORDER — HEPARIN SOD (PORK) LOCK FLUSH 100 UNIT/ML IV SOLN
500.0000 [IU] | Freq: Once | INTRAVENOUS | Status: AC
  Administered 2015-10-25: 500 [IU] via INTRAVENOUS
  Filled 2015-10-25: qty 5

## 2015-10-25 MED ORDER — PACLITAXEL PROTEIN-BOUND CHEMO INJECTION 100 MG
125.0000 mg/m2 | Freq: Once | INTRAVENOUS | Status: AC
  Administered 2015-10-25: 250 mg via INTRAVENOUS
  Filled 2015-10-25: qty 50

## 2015-10-25 MED ORDER — SODIUM CHLORIDE 0.9 % IV SOLN
2000.0000 mg | Freq: Once | INTRAVENOUS | Status: AC
  Administered 2015-10-25: 2000 mg via INTRAVENOUS
  Filled 2015-10-25: qty 52.6

## 2015-10-25 MED ORDER — SODIUM CHLORIDE 0.9 % IV SOLN
Freq: Once | INTRAVENOUS | Status: AC
  Administered 2015-10-25: 10:00:00 via INTRAVENOUS
  Filled 2015-10-25: qty 1000

## 2015-10-25 MED ORDER — SODIUM CHLORIDE 0.9 % IV SOLN
Freq: Once | INTRAVENOUS | Status: AC
  Administered 2015-10-25: 11:00:00 via INTRAVENOUS
  Filled 2015-10-25: qty 4

## 2015-10-25 MED ORDER — SODIUM CHLORIDE 0.9 % IJ SOLN
10.0000 mL | INTRAMUSCULAR | Status: DC | PRN
  Administered 2015-10-25: 10 mL via INTRAVENOUS
  Filled 2015-10-25: qty 10

## 2015-10-26 NOTE — Progress Notes (Signed)
Ronnie Johnson  Telephone:(336) [phone] [phone]) [phone]  ID: Ronnie Johnson OB: 1940-12-12  MR#: [id number]  XTA#:[id number]  Patient Care Team: Tonia Ghent, MD as PCP - General (Family Medicine) Hessie Knows, MD as Referring Physician (Orthopedic Surgery) Clent Jacks, RN as Registered Nurse  CHIEF COMPLAINT:  Chief Complaint  Patient presents with  . Pancreatic Cancer    INTERVAL HISTORY: Patient returns to clinic today for consideration of cycle 5, day 15 of gemcitabine and Abraxane. His fatigue has improved. He does not complain of leg pain today.  He continues to have difficulty sleeping.  He has no neurologic complaints. He denies any easy bleeding or bruising. He denies any fevers. He denies any chest pain or shortness of breath. He denies nausea, vomiting, constipation, or diarrhea. He no longer complains of hematuria.  Patient offers no further specific complaints today.  REVIEW OF SYSTEMS:   Review of Systems  Constitutional: Positive for malaise/fatigue. Negative for fever and weight loss.  Eyes: Negative for pain, discharge and redness.  Cardiovascular: Negative for leg swelling.  Gastrointestinal: Negative for nausea and abdominal pain.  Genitourinary: Negative for hematuria.  Musculoskeletal: Positive for myalgias.  Neurological: Positive for weakness.  Endo/Heme/Allergies: Does not bruise/bleed easily.  Psychiatric/Behavioral: The patient has insomnia.     As per HPI. Otherwise, a complete review of systems is negatve.  PAST MEDICAL HISTORY: Past Medical History  Diagnosis Date  . Hypertension   . DVT of leg (deep venous thrombosis) ([place]) 2009  . Nephrolithiasis   . Hyperlipidemia   . Allergic rhinitis   . CAD (coronary artery disease)   . Urethral diverticulum   . Varicose vein of leg   . Dupuytren's contracture   . ED (erectile dysfunction)   . History of colonic polyps   . GERD (gastroesophageal reflux disease)   .  Diverticulosis of colon   . Myocardial infarction ([place])   . DVT (deep venous thrombosis) ([place])   . Polio 1948    was hospitalized for 1 year  . Diabetes mellitus, type 2 ([place])     Patient takes Metformin.  . SCC (squamous cell carcinoma)     scalp 2013 per derm    PAST SURGICAL HISTORY: Past Surgical History  Procedure Laterality Date  . Appendectomy  1994  . Umbilical hernia repair  03/14/2005  . 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 Replacement total knee Left 2016  . Peripheral vascular catheterization N/A 06/23/2015    Procedure: [name];  Surgeon: Algernon Huxley, MD;  Location: [facility];  Service: Cardiovascular;  Laterality: N/A;    FAMILY HISTORY Family History  Problem Relation Age of Onset  . Diabetes Mother   . Hypertension Mother   . Stroke Mother   . Alzheimer's disease Mother   . Heart attack Father   . Hypertension Father   . Prostate cancer Brother   . Diabetes Brother   . Hypertension Brother   . Diabetes Sister   . Colon cancer Neg Hx   . Hypertension Daughter        ADVANCED DIRECTIVES:    HEALTH MAINTENANCE: Social History  Substance Use Topics  . Smoking status: Former Smoker    Quit date: 10/08/1988  . Smokeless tobacco: Never Used  . Alcohol Use: 0.0 oz/week    0 Standard drinks or equivalent per week     Comment: RARE     Colonoscopy:  PAP:  Bone  density:  Lipid panel:  Allergies  Allergen Reactions  . Glimepiride Other (See Comments)    Leg stiffness  . Lipitor [Atorvastatin Calcium]     Tolerates crestor  . Oxycodone Other (See Comments)    Altered mental state  . Tape   . Latex Rash    Current Outpatient Prescriptions  Medication Sig Dispense Refill  . Blood Glucose Monitoring Suppl (ONE TOUCH ULTRA SYSTEM KIT) W/DEVICE KIT Check blood sugar once daily and as directed. Dx E11.65 1 each 0  . carvedilol (COREG) 6.25 MG tablet Take 1 tablet (6.25 mg total) by mouth 2 (two) times daily with  a meal. 180 tablet 3  . esomeprazole (NEXIUM) 20 MG capsule Take 20 mg by mouth daily as needed (HEARTBURN).    . Insulin Glargine (LANTUS SOLOSTAR) 100 UNIT/ML Solostar Pen Inject 5 units once a day.  Can add 1 unit per day if AM sugar is >150. 5 pen PRN  . lidocaine-prilocaine (EMLA) cream Apply 1 application topically as needed. Apply to port 1 hour prior to chemotherapy appointment, cover with plastic wrap. 30 g 2  . lisinopril (PRINIVIL,ZESTRIL) 10 MG tablet Take 1 tablet (10 mg total) by mouth daily. 90 tablet 3  . metFORMIN (GLUCOPHAGE) 1000 MG tablet Take up to 1048m in the AM and 5064min the PM.  If GI upset, cut back to 100028m day total.    . nitroGLYCERIN (NITROSTAT) 0.4 MG SL tablet Place under the tongue.    . prochlorperazine (COMPAZINE) 10 MG tablet Take 1 tablet (10 mg total) by mouth every 6 (six) hours as needed for nausea or vomiting. 30 tablet 2  . sitaGLIPtin (JANUVIA) 100 MG tablet Take 1 tablet (100 mg total) by mouth daily. 90 tablet 1  . warfarin (COUMADIN) 5 MG tablet 7.5mg33m Monday, Wednesday, and Friday; 5mg 13mry remaining day 30 tablet 5  . zolpidem (AMBIEN) 10 MG tablet Take 1 tablet (10 mg total) by mouth at bedtime as needed for sleep. 30 tablet 0   No current facility-administered medications for this visit.    OBJECTIVE: Filed Vitals:   10/25/15 0940  BP: 117/65  Pulse: 78  Temp: 96.3 F (35.7 C)  Resp: 16     Body mass index is 25.65 kg/(m^2).    ECOG FS:1 - Symptomatic but completely ambulatory  General: Well-developed, well-nourished, no acute distress. Eyes: Pink conjunctiva, anicteric sclera. Lungs: Clear to auscultation bilaterally. Heart: Regular rate and rhythm. No rubs, murmurs, or gallops. Abdomen: Soft, nontender, nondistended. No organomegaly noted, normoactive bowel sounds. Musculoskeletal: Minimal bilateral edema, mild erythema of bilateral ankles. Neuro: Alert, answering all questions appropriately. Cranial nerves grossly  intact. Skin: No rashes or petechiae noted. Psych: Normal affect.    LAB RESULTS:  Lab Results  Component Value Date   NA 136 10/25/2015   K 3.7 10/25/2015   CL 102 10/25/2015   CO2 26 10/25/2015   GLUCOSE 247* 10/25/2015   BUN 11 10/25/2015   CREATININE 0.59* 10/25/2015   CALCIUM 8.9 10/25/2015   PROT 6.3* 10/25/2015   ALBUMIN 3.3* 10/25/2015   AST 12* 10/25/2015   ALT 12* 10/25/2015   ALKPHOS 50 10/25/2015   BILITOT 0.4 10/25/2015   GFRNONAA >60 10/25/2015   GFRAA >60 10/25/2015    Lab Results  Component Value Date   WBC 4.2 10/25/2015   NEUTROABS 2.8 10/25/2015   HGB 10.7* 10/25/2015   HCT 33.3* 10/25/2015   MCV 83.9 10/25/2015   PLT 129* 10/25/2015  STUDIES: No results found.  ASSESSMENT: Stage IV adenocarcinoma the pancreas.  PLAN:    1. Pancreatic cancer: PET scan results from September 09, 2015 reviewed independently and reported with improvement of disease burden and resolution of presumed liver metastasis. Despite this, patient's CA-19-9 has trended up slightly from 378 to 524. Proceed with cycle 5, day 15 of gemcitabine and Abraxane today. Patient will receive this regimen on days 1, 8, and 15 with day 22 off. Patient had consultation with surgery at [facility] who recommended completing 3 additional cycles of chemotherapy and then reimage prior to considering surgery. Return to clinic in 2 weeks for consideration of cycle 6, day 1. 2. Thrombocytopenia: Secondary chemotherapy. Monitor.  3. Anemia: Mild, monitor. 4. Hyperglycemia:  Continue diabetic medications as prescribed.  5. DVTs: Likely secondary to decreased activity and malignancy. Continue Coumadin. Patient's INR levels are monitored by his primary care. Goal INR 2.0-3.0. 6. Leg pain: Possibly secondary to deconditioning. Patient was offered physical therapy with the [facility] program, but he has refused. 7. Insomnia: Continue Ambien as needed. 8. Hematuria: Resolved. Continue follow-up  with urology as needed.   Patient expressed understanding and was in agreement with this plan. He also understands that He can call clinic at any time with any questions, concerns, or complaints.   Lloyd Huger, MD   10/26/2015 12:58 PM

## 2015-10-27 ENCOUNTER — Ambulatory Visit (INDEPENDENT_AMBULATORY_CARE_PROVIDER_SITE_OTHER): Payer: PPO | Admitting: *Deleted

## 2015-10-27 DIAGNOSIS — I82403 Acute embolism and thrombosis of unspecified deep veins of lower extremity, bilateral: Secondary | ICD-10-CM | POA: Diagnosis not present

## 2015-10-27 DIAGNOSIS — Z5181 Encounter for therapeutic drug level monitoring: Secondary | ICD-10-CM

## 2015-10-27 LAB — POCT INR: INR: 3.2

## 2015-10-27 NOTE — Progress Notes (Signed)
Pre visit review using our clinic review tool, if applicable. No additional management support is needed unless otherwise documented below in the visit note. 

## 2015-11-03 ENCOUNTER — Ambulatory Visit: Payer: PPO

## 2015-11-07 ENCOUNTER — Inpatient Hospital Stay: Payer: PPO

## 2015-11-07 DIAGNOSIS — C259 Malignant neoplasm of pancreas, unspecified: Secondary | ICD-10-CM

## 2015-11-07 DIAGNOSIS — C787 Secondary malignant neoplasm of liver and intrahepatic bile duct: Principal | ICD-10-CM

## 2015-11-07 DIAGNOSIS — Z5111 Encounter for antineoplastic chemotherapy: Secondary | ICD-10-CM | POA: Diagnosis not present

## 2015-11-07 LAB — CBC WITH DIFFERENTIAL/PLATELET
BASOS PCT: 1 %
Basophils Absolute: 0 10*3/uL (ref 0–0.1)
EOS PCT: 2 %
Eosinophils Absolute: 0.1 10*3/uL (ref 0–0.7)
HEMATOCRIT: 33.9 % — AB (ref 40.0–52.0)
Hemoglobin: 10.8 g/dL — ABNORMAL LOW (ref 13.0–18.0)
LYMPHS PCT: 15 %
Lymphs Abs: 0.8 10*3/uL — ABNORMAL LOW (ref 1.0–3.6)
MCH: 26.8 pg (ref 26.0–34.0)
MCHC: 31.9 g/dL — AB (ref 32.0–36.0)
MCV: 83.9 fL (ref 80.0–100.0)
MONO ABS: 0.6 10*3/uL (ref 0.2–1.0)
MONOS PCT: 12 %
NEUTROS ABS: 3.8 10*3/uL (ref 1.4–6.5)
Neutrophils Relative %: 70 %
PLATELETS: 263 10*3/uL (ref 150–440)
RBC: 4.04 MIL/uL — ABNORMAL LOW (ref 4.40–5.90)
RDW: 18.9 % — AB (ref 11.5–14.5)
WBC: 5.3 10*3/uL (ref 3.8–10.6)

## 2015-11-07 LAB — COMPREHENSIVE METABOLIC PANEL
ALBUMIN: 3.5 g/dL (ref 3.5–5.0)
ALT: 9 U/L — ABNORMAL LOW (ref 17–63)
ANION GAP: 6 (ref 5–15)
AST: 12 U/L — AB (ref 15–41)
Alkaline Phosphatase: 57 U/L (ref 38–126)
BUN: 10 mg/dL (ref 6–20)
CHLORIDE: 108 mmol/L (ref 101–111)
CO2: 29 mmol/L (ref 22–32)
Calcium: 9.1 mg/dL (ref 8.9–10.3)
Creatinine, Ser: 0.53 mg/dL — ABNORMAL LOW (ref 0.61–1.24)
GFR calc Af Amer: >60 mL/min (ref >60)
GFR calc non Af Amer: >60 mL/min (ref >60)
GLUCOSE: 253 mg/dL — AB (ref 65–99)
POTASSIUM: 4.3 mmol/L (ref 3.5–5.1)
Sodium: 143 mmol/L (ref 135–145)
TOTAL PROTEIN: 6.4 g/dL — AB (ref 6.5–8.1)
Total Bilirubin: 0.3 mg/dL (ref 0.3–1.2)

## 2015-11-08 ENCOUNTER — Inpatient Hospital Stay: Payer: PPO

## 2015-11-08 ENCOUNTER — Inpatient Hospital Stay (HOSPITAL_BASED_OUTPATIENT_CLINIC_OR_DEPARTMENT_OTHER): Payer: PPO | Admitting: Oncology

## 2015-11-08 ENCOUNTER — Other Ambulatory Visit: Payer: PPO

## 2015-11-08 ENCOUNTER — Inpatient Hospital Stay: Payer: PPO | Admitting: Oncology

## 2015-11-08 VITALS — BP 122/77 | HR 80 | Temp 97.0°F | Resp 18

## 2015-11-08 DIAGNOSIS — G4700 Insomnia, unspecified: Secondary | ICD-10-CM

## 2015-11-08 DIAGNOSIS — Z5111 Encounter for antineoplastic chemotherapy: Secondary | ICD-10-CM | POA: Diagnosis not present

## 2015-11-08 DIAGNOSIS — C259 Malignant neoplasm of pancreas, unspecified: Secondary | ICD-10-CM

## 2015-11-08 DIAGNOSIS — T451X5S Adverse effect of antineoplastic and immunosuppressive drugs, sequela: Secondary | ICD-10-CM | POA: Diagnosis not present

## 2015-11-08 DIAGNOSIS — C250 Malignant neoplasm of head of pancreas: Secondary | ICD-10-CM

## 2015-11-08 DIAGNOSIS — Z794 Long term (current) use of insulin: Secondary | ICD-10-CM

## 2015-11-08 DIAGNOSIS — C787 Secondary malignant neoplasm of liver and intrahepatic bile duct: Secondary | ICD-10-CM

## 2015-11-08 DIAGNOSIS — D649 Anemia, unspecified: Secondary | ICD-10-CM

## 2015-11-08 DIAGNOSIS — D6959 Other secondary thrombocytopenia: Secondary | ICD-10-CM | POA: Diagnosis not present

## 2015-11-08 DIAGNOSIS — Z7984 Long term (current) use of oral hypoglycemic drugs: Secondary | ICD-10-CM

## 2015-11-08 DIAGNOSIS — R531 Weakness: Secondary | ICD-10-CM

## 2015-11-08 DIAGNOSIS — R5383 Other fatigue: Secondary | ICD-10-CM

## 2015-11-08 DIAGNOSIS — E1165 Type 2 diabetes mellitus with hyperglycemia: Secondary | ICD-10-CM

## 2015-11-08 DIAGNOSIS — R5381 Other malaise: Secondary | ICD-10-CM

## 2015-11-08 MED ORDER — PACLITAXEL PROTEIN-BOUND CHEMO INJECTION 100 MG
125.0000 mg/m2 | Freq: Once | INTRAVENOUS | Status: AC
Start: 2015-11-08 — End: 2015-11-08
  Administered 2015-11-08: 250 mg via INTRAVENOUS
  Filled 2015-11-08: qty 50

## 2015-11-08 MED ORDER — SODIUM CHLORIDE 0.9 % IV SOLN
2000.0000 mg | Freq: Once | INTRAVENOUS | Status: AC
  Administered 2015-11-08: 2000 mg via INTRAVENOUS
  Filled 2015-11-08: qty 52.6

## 2015-11-08 MED ORDER — SODIUM CHLORIDE 0.9 % IV SOLN
Freq: Once | INTRAVENOUS | Status: AC
  Administered 2015-11-08: 10:00:00 via INTRAVENOUS
  Filled 2015-11-08: qty 4

## 2015-11-08 MED ORDER — HEPARIN SOD (PORK) LOCK FLUSH 100 UNIT/ML IV SOLN
500.0000 [IU] | Freq: Once | INTRAVENOUS | Status: AC | PRN
  Administered 2015-11-08: 500 [IU]
  Filled 2015-11-08: qty 5

## 2015-11-08 MED ORDER — SODIUM CHLORIDE 0.9 % IJ SOLN
10.0000 mL | INTRAMUSCULAR | Status: DC | PRN
  Administered 2015-11-08: 10 mL
  Filled 2015-11-08: qty 10

## 2015-11-08 MED ORDER — SODIUM CHLORIDE 0.9 % IV SOLN
Freq: Once | INTRAVENOUS | Status: AC
  Administered 2015-11-08: 09:00:00 via INTRAVENOUS
  Filled 2015-11-08: qty 1000

## 2015-11-09 LAB — CANCER ANTIGEN 19-9: CA 19 9: 710 U/mL — AB (ref 0–35)

## 2015-11-10 ENCOUNTER — Ambulatory Visit: Payer: PPO

## 2015-11-10 ENCOUNTER — Other Ambulatory Visit: Payer: PPO

## 2015-11-11 ENCOUNTER — Other Ambulatory Visit: Payer: PPO

## 2015-11-11 ENCOUNTER — Other Ambulatory Visit: Payer: Self-pay | Admitting: Family Medicine

## 2015-11-11 DIAGNOSIS — E119 Type 2 diabetes mellitus without complications: Secondary | ICD-10-CM

## 2015-11-11 NOTE — Progress Notes (Signed)
[facility]  [phone] [phone]) [phone]  ID: Ronnie Johnson OB: 02-12-1941  MR#: [id number]  EQA#:[id number]  Patient Care Team: Tonia Ghent, MD as PCP - General (Family Medicine) Hessie Knows, MD as Referring Physician (Orthopedic Surgery) Clent Jacks, RN as Registered Nurse  CHIEF COMPLAINT:  No chief complaint on file.   INTERVAL HISTORY: Patient returns to clinic today for consideration of cycle 6, day 1 of gemcitabine and Abraxane. His fatigue has improved. He does not complain of leg pain today.  He continues to have difficulty sleeping.  He has no neurologic complaints. He denies any easy bleeding or bruising. He denies any fevers. He denies any chest pain or shortness of breath. He denies nausea, vomiting, constipation, or diarrhea. He no longer complains of hematuria.  Patient offers no further specific complaints today.  REVIEW OF SYSTEMS:   Review of Systems  Constitutional: Positive for malaise/fatigue. Negative for fever and weight loss.  Eyes: Negative for pain, discharge and redness.  Cardiovascular: Negative for leg swelling.  Gastrointestinal: Negative for nausea and abdominal pain.  Genitourinary: Negative for hematuria.  Musculoskeletal: Positive for myalgias.  Neurological: Positive for weakness.  Endo/Heme/Allergies: Does not bruise/bleed easily.  Psychiatric/Behavioral: The patient has insomnia.     As per HPI. Otherwise, a complete review of systems is negatve.  PAST MEDICAL HISTORY: Past Medical History  Diagnosis Date  . Hypertension   . DVT of leg (deep venous thrombosis) ([place]) 2009  . Nephrolithiasis   . Hyperlipidemia   . Allergic rhinitis   . CAD (coronary artery disease)   . Urethral diverticulum   . Varicose vein of leg   . Dupuytren's contracture   . ED (erectile dysfunction)   . History of colonic polyps   . GERD (gastroesophageal reflux disease)   . Diverticulosis of colon   . Myocardial  infarction ([place])   . DVT (deep venous thrombosis) ([place])   . Polio 1948    was hospitalized for 1 year  . Diabetes mellitus, type 2 ([place])     Patient takes Metformin.  . SCC (squamous cell carcinoma)     scalp 2013 per derm    PAST SURGICAL HISTORY: Past Surgical History  Procedure Laterality Date  . Appendectomy  1994  . Umbilical hernia repair  03/14/2005  . 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 Replacement total knee Left 2016  . Peripheral vascular catheterization N/A 06/23/2015    Procedure: [name];  Surgeon: Algernon Huxley, MD;  Location: [facility];  Service: Cardiovascular;  Laterality: N/A;    FAMILY HISTORY Family History  Problem Relation Age of Onset  . Diabetes Mother   . Hypertension Mother   . Stroke Mother   . Alzheimer's disease Mother   . Heart attack Father   . Hypertension Father   . Prostate cancer Brother   . Diabetes Brother   . Hypertension Brother   . Diabetes Sister   . Colon cancer Neg Hx   . Hypertension Daughter        ADVANCED DIRECTIVES:    HEALTH MAINTENANCE: Social History  Substance Use Topics  . Smoking status: Former Smoker    Quit date: 10/08/1988  . Smokeless tobacco: Never Used  . Alcohol Use: 0.0 oz/week    0 Standard drinks or equivalent per week     Comment: RARE     Colonoscopy:  PAP:  Bone density:  Lipid panel:  Allergies  Allergen Reactions  . Glimepiride Other (See Comments)    Leg stiffness  . Lipitor [Atorvastatin Calcium]     Tolerates crestor  . Oxycodone Other (See Comments)    Altered mental state  . Tape   . Latex Rash    Current Outpatient Prescriptions  Medication Sig Dispense Refill  . Blood Glucose Monitoring Suppl (ONE TOUCH ULTRA SYSTEM KIT) W/DEVICE KIT Check blood sugar once daily and as directed. Dx E11.65 1 each 0  . carvedilol (COREG) 6.25 MG tablet Take 1 tablet (6.25 mg total) by mouth 2 (two) times daily with a meal. 180 tablet 3  . esomeprazole  (NEXIUM) 20 MG capsule Take 20 mg by mouth daily as needed (HEARTBURN).    . Insulin Glargine (LANTUS SOLOSTAR) 100 UNIT/ML Solostar Pen Inject 5 units once a day.  Can add 1 unit per day if AM sugar is >150. 5 pen PRN  . lidocaine-prilocaine (EMLA) cream Apply 1 application topically as needed. Apply to port 1 hour prior to chemotherapy appointment, cover with plastic wrap. 30 g 2  . lisinopril (PRINIVIL,ZESTRIL) 10 MG tablet Take 1 tablet (10 mg total) by mouth daily. 90 tablet 3  . metFORMIN (GLUCOPHAGE) 1000 MG tablet Take up to 1011m in the AM and 5065min the PM.  If GI upset, cut back to 10001m day total.    . nitroGLYCERIN (NITROSTAT) 0.4 MG SL tablet Place under the tongue.    . prochlorperazine (COMPAZINE) 10 MG tablet Take 1 tablet (10 mg total) by mouth every 6 (six) hours as needed for nausea or vomiting. 30 tablet 2  . sitaGLIPtin (JANUVIA) 100 MG tablet Take 1 tablet (100 mg total) by mouth daily. 90 tablet 1  . warfarin (COUMADIN) 5 MG tablet 7.5mg72m Monday, Wednesday, and Friday; 5mg 56mry remaining day 30 tablet 5  . zolpidem (AMBIEN) 10 MG tablet Take 1 tablet (10 mg total) by mouth at bedtime as needed for sleep. 30 tablet 0   No current facility-administered medications for this visit.    OBJECTIVE: There were no vitals filed for this visit.   There is no weight on file to calculate BMI.    ECOG FS:1 - Symptomatic but completely ambulatory  General: Well-developed, well-nourished, no acute distress. Eyes: Pink conjunctiva, anicteric sclera. Lungs: Clear to auscultation bilaterally. Heart: Regular rate and rhythm. No rubs, murmurs, or gallops. Abdomen: Soft, nontender, nondistended. No organomegaly noted, normoactive bowel sounds. Musculoskeletal: Minimal bilateral edema, mild erythema of bilateral ankles. Neuro: Alert, answering all questions appropriately. Cranial nerves grossly intact. Skin: No rashes or petechiae noted. Psych: Normal affect.    LAB  RESULTS:  Lab Results  Component Value Date   NA 143 11/07/2015   K 4.3 11/07/2015   CL 108 11/07/2015   CO2 29 11/07/2015   GLUCOSE 253* 11/07/2015   BUN 10 11/07/2015   CREATININE 0.53* 11/07/2015   CALCIUM 9.1 11/07/2015   PROT 6.4* 11/07/2015   ALBUMIN 3.5 11/07/2015   AST 12* 11/07/2015   ALT 9* 11/07/2015   ALKPHOS 57 11/07/2015   BILITOT 0.3 11/07/2015   GFRNONAA >60 11/07/2015   GFRAA >60 11/07/2015    Lab Results  Component Value Date   WBC 5.3 11/07/2015   NEUTROABS 3.8 11/07/2015   HGB 10.8* 11/07/2015   HCT 33.9* 11/07/2015   MCV 83.9 11/07/2015   PLT 263 11/07/2015   Lab Results  Component Value Date   CA199 710* 11/08/2015    STUDIES: No results found.  ASSESSMENT:  Stage IV adenocarcinoma the pancreas.  PLAN:    1. Pancreatic cancer: PET scan results from September 09, 2015 reviewed independently and reported with improvement of disease burden and resolution of presumed liver metastasis. Patient's CA-19-9 continues to increase and is now 710. Will restage with PET scan prior to next infusion. Proceed with cycle 6, day 1 of gemcitabine and Abraxane today. Patient will receive this regimen on days 1, 8, and 15 with day 22 off. Patient had consultation with surgery at [facility] who recommended completing 3 additional cycles of chemotherapy and then reimage prior to considering surgery. Return to clinic in 1 week for consideration of cycle 6, day 8. 2. Thrombocytopenia: Patient platelet count is within normal limits. Secondary chemotherapy. Monitor.  3. Anemia: Mild, monitor. 4. Hyperglycemia:  Continue diabetic medications as prescribed.  5. DVTs: Likely secondary to decreased activity and malignancy. Continue Coumadin. Patient's INR levels are monitored by his primary care. Goal INR 2.0-3.0. 6. Leg pain: Possibly secondary to deconditioning. Patient was offered physical therapy with the [facility] program, but he has refused. 7. Insomnia: Continue  Ambien as needed. 8. Hematuria: Resolved. Continue follow-up with urology as needed.   Patient expressed understanding and was in agreement with this plan. He also understands that He can call clinic at any time with any questions, concerns, or complaints.   Lloyd Huger, MD   11/11/2015 1:51 PM

## 2015-11-14 ENCOUNTER — Other Ambulatory Visit (INDEPENDENT_AMBULATORY_CARE_PROVIDER_SITE_OTHER): Payer: PPO

## 2015-11-14 ENCOUNTER — Telehealth: Payer: Self-pay | Admitting: *Deleted

## 2015-11-14 ENCOUNTER — Ambulatory Visit: Admission: RE | Admit: 2015-11-14 | Payer: PPO | Source: Ambulatory Visit

## 2015-11-14 ENCOUNTER — Ambulatory Visit (INDEPENDENT_AMBULATORY_CARE_PROVIDER_SITE_OTHER): Payer: PPO | Admitting: *Deleted

## 2015-11-14 DIAGNOSIS — Z5181 Encounter for therapeutic drug level monitoring: Secondary | ICD-10-CM

## 2015-11-14 DIAGNOSIS — I82403 Acute embolism and thrombosis of unspecified deep veins of lower extremity, bilateral: Secondary | ICD-10-CM | POA: Diagnosis not present

## 2015-11-14 DIAGNOSIS — E119 Type 2 diabetes mellitus without complications: Secondary | ICD-10-CM | POA: Diagnosis not present

## 2015-11-14 LAB — LIPID PANEL
CHOL/HDL RATIO: 3
Cholesterol: 86 mg/dL (ref 0–200)
HDL: 31.7 mg/dL — AB (ref >39.00)
LDL CALC: 39 mg/dL (ref 0–99)
NonHDL: 54.13
TRIGLYCERIDES: 78 mg/dL (ref 0.0–149.0)
VLDL: 15.6 mg/dL (ref 0.0–40.0)

## 2015-11-14 LAB — POCT INR: INR: 2.5

## 2015-11-14 LAB — HEMOGLOBIN A1C: Hgb A1c MFr Bld: 8 % — ABNORMAL HIGH (ref 4.6–6.5)

## 2015-11-14 NOTE — Progress Notes (Signed)
INR is back to goal.  Will continue to current dose.

## 2015-11-14 NOTE — Telephone Encounter (Signed)
error 

## 2015-11-14 NOTE — Progress Notes (Signed)
Pre visit review using our clinic review tool, if applicable. No additional management support is needed unless otherwise documented below in the visit note. 

## 2015-11-14 NOTE — Telephone Encounter (Signed)
Wife gave him OJ this morning for hypoglycemia so they cannot do his PET, he has chemo tomorrow and they said they cannot do it afterwards due to chemo increasing his glucose. They need to know when to reschedule his PET for

## 2015-11-14 NOTE — Telephone Encounter (Signed)
Per Dr Grayland Ormond, will reschedule at tomorrows appt, Judson Roch notified

## 2015-11-15 ENCOUNTER — Inpatient Hospital Stay (HOSPITAL_BASED_OUTPATIENT_CLINIC_OR_DEPARTMENT_OTHER): Payer: PPO | Admitting: Oncology

## 2015-11-15 ENCOUNTER — Inpatient Hospital Stay: Payer: PPO

## 2015-11-15 ENCOUNTER — Inpatient Hospital Stay: Payer: PPO | Attending: Oncology

## 2015-11-15 VITALS — BP 135/72 | HR 73 | Temp 96.2°F | Resp 16 | Wt 165.3 lb

## 2015-11-15 VITALS — BP 135/80 | HR 76 | Temp 97.0°F | Resp 20

## 2015-11-15 DIAGNOSIS — K219 Gastro-esophageal reflux disease without esophagitis: Secondary | ICD-10-CM | POA: Diagnosis not present

## 2015-11-15 DIAGNOSIS — Z5111 Encounter for antineoplastic chemotherapy: Secondary | ICD-10-CM | POA: Diagnosis present

## 2015-11-15 DIAGNOSIS — C787 Secondary malignant neoplasm of liver and intrahepatic bile duct: Principal | ICD-10-CM

## 2015-11-15 DIAGNOSIS — I6523 Occlusion and stenosis of bilateral carotid arteries: Secondary | ICD-10-CM | POA: Diagnosis not present

## 2015-11-15 DIAGNOSIS — Z794 Long term (current) use of insulin: Secondary | ICD-10-CM

## 2015-11-15 DIAGNOSIS — C259 Malignant neoplasm of pancreas, unspecified: Secondary | ICD-10-CM

## 2015-11-15 DIAGNOSIS — Z7901 Long term (current) use of anticoagulants: Secondary | ICD-10-CM

## 2015-11-15 DIAGNOSIS — T451X5S Adverse effect of antineoplastic and immunosuppressive drugs, sequela: Secondary | ICD-10-CM | POA: Insufficient documentation

## 2015-11-15 DIAGNOSIS — R531 Weakness: Secondary | ICD-10-CM | POA: Diagnosis not present

## 2015-11-15 DIAGNOSIS — M791 Myalgia: Secondary | ICD-10-CM

## 2015-11-15 DIAGNOSIS — I2510 Atherosclerotic heart disease of native coronary artery without angina pectoris: Secondary | ICD-10-CM | POA: Insufficient documentation

## 2015-11-15 DIAGNOSIS — R5381 Other malaise: Secondary | ICD-10-CM | POA: Diagnosis not present

## 2015-11-15 DIAGNOSIS — D649 Anemia, unspecified: Secondary | ICD-10-CM | POA: Insufficient documentation

## 2015-11-15 DIAGNOSIS — E1165 Type 2 diabetes mellitus with hyperglycemia: Secondary | ICD-10-CM | POA: Diagnosis not present

## 2015-11-15 DIAGNOSIS — E785 Hyperlipidemia, unspecified: Secondary | ICD-10-CM | POA: Insufficient documentation

## 2015-11-15 DIAGNOSIS — Z86718 Personal history of other venous thrombosis and embolism: Secondary | ICD-10-CM

## 2015-11-15 DIAGNOSIS — M79606 Pain in leg, unspecified: Secondary | ICD-10-CM

## 2015-11-15 DIAGNOSIS — C250 Malignant neoplasm of head of pancreas: Secondary | ICD-10-CM | POA: Diagnosis not present

## 2015-11-15 DIAGNOSIS — Z86010 Personal history of colonic polyps: Secondary | ICD-10-CM | POA: Insufficient documentation

## 2015-11-15 DIAGNOSIS — I252 Old myocardial infarction: Secondary | ICD-10-CM | POA: Diagnosis not present

## 2015-11-15 DIAGNOSIS — Z87891 Personal history of nicotine dependence: Secondary | ICD-10-CM | POA: Insufficient documentation

## 2015-11-15 DIAGNOSIS — D6959 Other secondary thrombocytopenia: Secondary | ICD-10-CM | POA: Insufficient documentation

## 2015-11-15 DIAGNOSIS — Z7984 Long term (current) use of oral hypoglycemic drugs: Secondary | ICD-10-CM | POA: Insufficient documentation

## 2015-11-15 DIAGNOSIS — G4700 Insomnia, unspecified: Secondary | ICD-10-CM | POA: Insufficient documentation

## 2015-11-15 DIAGNOSIS — R5383 Other fatigue: Secondary | ICD-10-CM

## 2015-11-15 DIAGNOSIS — Z79899 Other long term (current) drug therapy: Secondary | ICD-10-CM | POA: Insufficient documentation

## 2015-11-15 LAB — COMPREHENSIVE METABOLIC PANEL
ALK PHOS: 46 U/L (ref 38–126)
ALT: 9 U/L — AB (ref 17–63)
AST: 10 U/L — AB (ref 15–41)
Albumin: 3.1 g/dL — ABNORMAL LOW (ref 3.5–5.0)
Anion gap: 6 (ref 5–15)
BILIRUBIN TOTAL: 0.4 mg/dL (ref 0.3–1.2)
BUN: 11 mg/dL (ref 6–20)
CALCIUM: 8.5 mg/dL — AB (ref 8.9–10.3)
CHLORIDE: 105 mmol/L (ref 101–111)
CO2: 26 mmol/L (ref 22–32)
CREATININE: 0.61 mg/dL (ref 0.61–1.24)
Glucose, Bld: 196 mg/dL — ABNORMAL HIGH (ref 65–99)
Potassium: 3.4 mmol/L — ABNORMAL LOW (ref 3.5–5.1)
Sodium: 137 mmol/L (ref 135–145)
TOTAL PROTEIN: 6.1 g/dL — AB (ref 6.5–8.1)

## 2015-11-15 LAB — CBC WITH DIFFERENTIAL/PLATELET
BASOS ABS: 0.1 10*3/uL (ref 0–0.1)
Basophils Relative: 1 %
Eosinophils Absolute: 0 10*3/uL (ref 0–0.7)
Eosinophils Relative: 1 %
HEMATOCRIT: 31.8 % — AB (ref 40.0–52.0)
HEMOGLOBIN: 10.5 g/dL — AB (ref 13.0–18.0)
LYMPHS ABS: 0.9 10*3/uL — AB (ref 1.0–3.6)
LYMPHS PCT: 16 %
MCH: 27.5 pg (ref 26.0–34.0)
MCHC: 33 g/dL (ref 32.0–36.0)
MCV: 83.4 fL (ref 80.0–100.0)
Monocytes Absolute: 0.5 10*3/uL (ref 0.2–1.0)
Monocytes Relative: 9 %
NEUTROS ABS: 3.9 10*3/uL (ref 1.4–6.5)
Neutrophils Relative %: 73 %
Platelets: 191 10*3/uL (ref 150–440)
RBC: 3.82 MIL/uL — AB (ref 4.40–5.90)
RDW: 18.7 % — ABNORMAL HIGH (ref 11.5–14.5)
WBC: 5.4 10*3/uL (ref 3.8–10.6)

## 2015-11-15 MED ORDER — HEPARIN SOD (PORK) LOCK FLUSH 100 UNIT/ML IV SOLN
500.0000 [IU] | Freq: Once | INTRAVENOUS | Status: AC | PRN
  Administered 2015-11-15: 500 [IU]
  Filled 2015-11-15: qty 5

## 2015-11-15 MED ORDER — SODIUM CHLORIDE 0.9 % IV SOLN
Freq: Once | INTRAVENOUS | Status: AC
  Administered 2015-11-15: 11:00:00 via INTRAVENOUS
  Filled 2015-11-15: qty 1000

## 2015-11-15 MED ORDER — SODIUM CHLORIDE 0.9 % IV SOLN
2000.0000 mg | Freq: Once | INTRAVENOUS | Status: AC
  Administered 2015-11-15: 2000 mg via INTRAVENOUS
  Filled 2015-11-15: qty 52.6

## 2015-11-15 MED ORDER — DEXAMETHASONE SODIUM PHOSPHATE 100 MG/10ML IJ SOLN
Freq: Once | INTRAMUSCULAR | Status: AC
  Administered 2015-11-15: 11:00:00 via INTRAVENOUS
  Filled 2015-11-15: qty 4

## 2015-11-15 MED ORDER — PACLITAXEL PROTEIN-BOUND CHEMO INJECTION 100 MG
125.0000 mg/m2 | Freq: Once | INTRAVENOUS | Status: AC
  Administered 2015-11-15: 250 mg via INTRAVENOUS
  Filled 2015-11-15: qty 50

## 2015-11-15 NOTE — Progress Notes (Signed)
Patient was not able to perform PET yesterday due to drinking cranberry juice that morning because his glucose dropped to 50 and it did increase to 177 after the juice.  He reports having occasional abdominal pain that is tolerable and not needing any pain medication.

## 2015-11-16 LAB — CANCER ANTIGEN 19-9: CA 19 9: 659 U/mL — AB (ref 0–35)

## 2015-11-18 ENCOUNTER — Telehealth: Payer: Self-pay | Admitting: *Deleted

## 2015-11-18 ENCOUNTER — Encounter: Payer: Self-pay | Admitting: Family Medicine

## 2015-11-18 ENCOUNTER — Encounter: Payer: PPO | Admitting: Family Medicine

## 2015-11-18 ENCOUNTER — Ambulatory Visit (INDEPENDENT_AMBULATORY_CARE_PROVIDER_SITE_OTHER): Payer: PPO | Admitting: Family Medicine

## 2015-11-18 ENCOUNTER — Ambulatory Visit
Admission: RE | Admit: 2015-11-18 | Discharge: 2015-11-18 | Disposition: A | Discharge status: Home / Self Care | Payer: PPO | Source: Ambulatory Visit | Attending: Oncology | Admitting: Oncology

## 2015-11-18 VITALS — BP 112/68 | HR 77 | Temp 97.6°F | Ht 65.25 in | Wt 162.8 lb

## 2015-11-18 DIAGNOSIS — C250 Malignant neoplasm of head of pancreas: Secondary | ICD-10-CM | POA: Diagnosis present

## 2015-11-18 DIAGNOSIS — K769 Liver disease, unspecified: Secondary | ICD-10-CM | POA: Insufficient documentation

## 2015-11-18 DIAGNOSIS — Z0000 Encounter for general adult medical examination without abnormal findings: Secondary | ICD-10-CM | POA: Diagnosis not present

## 2015-11-18 DIAGNOSIS — E1165 Type 2 diabetes mellitus with hyperglycemia: Secondary | ICD-10-CM | POA: Diagnosis not present

## 2015-11-18 DIAGNOSIS — C259 Malignant neoplasm of pancreas, unspecified: Secondary | ICD-10-CM

## 2015-11-18 DIAGNOSIS — Z794 Long term (current) use of insulin: Secondary | ICD-10-CM

## 2015-11-18 DIAGNOSIS — IMO0001 Reserved for inherently not codable concepts without codable children: Secondary | ICD-10-CM

## 2015-11-18 DIAGNOSIS — C787 Secondary malignant neoplasm of liver and intrahepatic bile duct: Secondary | ICD-10-CM

## 2015-11-18 DIAGNOSIS — Z0189 Encounter for other specified special examinations: Secondary | ICD-10-CM | POA: Diagnosis present

## 2015-11-18 LAB — GLUCOSE, CAPILLARY: Glucose-Capillary: 123 mg/dL — ABNORMAL HIGH (ref 65–99)

## 2015-11-18 MED ORDER — METFORMIN HCL 1000 MG PO TABS: ORAL_TABLET | ORAL | Status: DC

## 2015-11-18 MED ORDER — CARVEDILOL 6.25 MG PO TABS: 6.2500 mg | ORAL_TABLET | Freq: Two times a day (BID) | ORAL | Status: DC

## 2015-11-18 MED ORDER — FLUDEOXYGLUCOSE F - 18 (FDG) INJECTION
12.1000 | Freq: Once | INTRAVENOUS | Status: AC | PRN
  Administered 2015-11-18: 12.1 via INTRAVENOUS

## 2015-11-18 MED ORDER — ESOMEPRAZOLE MAGNESIUM 20 MG PO CPDR: 20.0000 mg | DELAYED_RELEASE_CAPSULE | Freq: Every day | ORAL | Status: DC | PRN

## 2015-11-18 MED ORDER — ROSUVASTATIN CALCIUM 20 MG PO TABS: 10.0000 mg | ORAL_TABLET | Freq: Every day | ORAL | Status: DC

## 2015-11-18 MED ORDER — SITAGLIPTIN PHOSPHATE 100 MG PO TABS: 100.0000 mg | ORAL_TABLET | Freq: Every day | ORAL | Status: DC

## 2015-11-18 MED ORDER — LISINOPRIL 10 MG PO TABS
10.0000 mg | ORAL_TABLET | Freq: Every day | ORAL | Status: DC
Start: 2015-11-18 — End: 2016-03-23

## 2015-11-18 NOTE — Telephone Encounter (Signed)
Please call the patient I am not comfortable telling him this news

## 2015-11-18 NOTE — Progress Notes (Signed)
Pre visit review using our clinic review tool, if applicable. No additional management support is needed unless otherwise documented below in the visit note.  I have personally reviewed the Medicare Annual Wellness questionnaire and have noted 1. The patient's medical and social history 2. Their use of alcohol, tobacco or illicit drugs 3. Their current medications and supplements 4. The patient's functional ability including ADL's, fall risks, home safety risks and hearing or visual             impairment. 5. Diet and physical activities 6. Evidence for depression or mood disorders  The patients weight, height, BMI have been recorded in the chart and visual acuity is per eye clinic.  I have made referrals, counseling and provided education to the patient based review of the above and I have provided the pt with a written personalized care plan for preventive services.  Provider list updated- see scanned forms.  Routine anticipatory guidance given to patient.  See health maintenance.  Flu 2016 Shingles 2012 PNA 2016 Tetanus 2012 Colonoscopy 2015 Prostate cancer screening NA, declined.  Advance directive - wife designated if patient were incapacitated.   Cognitive function addressed- see scanned forms- and if abnormal then additional documentation follows.   He had been weak from chemo and cancer.  He had his PET this AM.    Diabetes:  Using medications without difficulties: yes, usually but had been skipping some doses of insulin.  D/w pt.  Hypoglycemic episodes:no Hyperglycemic episodes: with skipped doses of insulin.   Feet problems: some occ tingling in the feet Blood Sugars averaging: up to 300s.  eye exam within last year: yes, 07/2015.  Taking insulin about 4 times a week.  Taking ~14 units per dose.    PMH and SH reviewed  Meds, vitals, and allergies reviewed.   ROS: See HPI.  Otherwise negative.    GEN: nad, alert and oriented HEENT: mucous membranes moist NECK:  supple w/o LA CV: rrr. PULM: ctab, no inc wob ABD: soft, +bs EXT: no edema SKIN: no acute rash

## 2015-11-18 NOTE — Telephone Encounter (Signed)
Mild progression of disease.  Patient likely no longer a surgical candidate.  Keep appt next week to discuss further treatment options.

## 2015-11-18 NOTE — Patient Instructions (Signed)
Recheck A1c in about 3 months.  I would adjust your insulin based on your sugars.  If needed, you can add or subtract 1 unit of insulin a day from the previous day's dose.   I'll await the PET scan results.   Take care.  Update me as needed, especially if you have variation in your sugars.  If I can be of help otherwise, then let me know.  Glad to see you.

## 2015-11-18 NOTE — Telephone Encounter (Addendum)
Asking if Dr Grayland Ormond will call them this afternoon with the results of his PET scan from this morning  IMPRESSION: 1. Mild progression of disease. 2. New hypermetabolism corresponding to hepatic dome lesion, consistent with progressive metastasis. A new right cardiophrenic angle hypermetabolic node is also highly suspicious for disease progression. 3. Pancreatic primary and surrounding lymph nodes demonstrate primarily similar hypermetabolism. However, these have undergone interval mild enlargement. Further, a gastrohepatic ligament nodal metastasis is new. 4. Vague hypermetabolism along the anterior left hepatic capsule warrants followup attention to exclude CT occult peritoneal metastasis.   Electronically Signed  By: Abigail Miyamoto M.D.  On: 11/18/2015 10:22

## 2015-11-20 ENCOUNTER — Other Ambulatory Visit: Payer: Self-pay | Admitting: Family Medicine

## 2015-11-20 NOTE — Progress Notes (Signed)
[facility]  Telephone:(336) [phone] Fax:(336) [phone]  ID: Bryson Gavia OB: 1940/11/01  MR#: [id number]  DOO#:[id number]  Patient Care Team: Joaquim Nam, MD as PCP - General (Family Medicine) Kennedy Bucker, MD as Referring Physician (Orthopedic Surgery) Benita Gutter, RN as Registered Nurse  CHIEF COMPLAINT:  Chief Complaint  Patient presents with  . Pancreatic Cancer    INTERVAL HISTORY: Patient returns to clinic today for consideration of cycle 6, day 8 of gemcitabine and Abraxane. His PET scan was postponed secondary to patient drinking juice for a blood glucose of 50 prior to his scan. He currently feels well. He has no neurologic complaints. He denies any easy bleeding or bruising. He denies any fevers. He denies any chest pain or shortness of breath. He denies nausea, vomiting, constipation, or diarrhea. He no longer complains of hematuria.  Patient offers no further specific complaints today.  REVIEW OF SYSTEMS:   Review of Systems  Constitutional: Positive for malaise/fatigue. Negative for fever and weight loss.  Eyes: Negative for pain, discharge and redness.  Cardiovascular: Negative for leg swelling.  Gastrointestinal: Negative for nausea and abdominal pain.  Genitourinary: Negative for hematuria.  Musculoskeletal: Positive for myalgias.  Neurological: Positive for weakness.  Endo/Heme/Allergies: Does not bruise/bleed easily.  Psychiatric/Behavioral: The patient has insomnia.     As per HPI. Otherwise, a complete review of systems is negatve.  PAST MEDICAL HISTORY: Past Medical History  Diagnosis Date  . Hypertension   . DVT of leg (deep venous thrombosis) ([facility]) 2009  . Nephrolithiasis   . Hyperlipidemia   . Allergic rhinitis   . CAD (coronary artery disease)   . Urethral diverticulum   . Varicose vein of leg   . Dupuytren's contracture   . ED (erectile dysfunction)   . History of colonic polyps   . GERD (gastroesophageal  reflux disease)   . Diverticulosis of colon   . Myocardial infarction ([facility])   . DVT (deep venous thrombosis) ([facility])   . Polio 1948    was hospitalized for 1 year  . Diabetes mellitus, type 2 ([facility])     Patient takes Metformin.  . SCC (squamous cell carcinoma)     scalp 2013 per derm    PAST SURGICAL HISTORY: Past Surgical History  Procedure Laterality Date  . Appendectomy  1994  . Umbilical hernia repair  03/14/2005  . 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 Replacement total knee Left 2016  . Peripheral vascular catheterization N/A 06/23/2015    Procedure: Shelda Pal Cath Insertion;  Surgeon: Annice Needy, MD;  Location: [facility];  Service: Cardiovascular;  Laterality: N/A;    FAMILY HISTORY Family History  Problem Relation Age of Onset  . Diabetes Mother   . Hypertension Mother   . Stroke Mother   . Alzheimer's disease Mother   . Heart attack Father   . Hypertension Father   . Prostate cancer Brother   . Diabetes Brother   . Hypertension Brother   . Diabetes Sister   . Colon cancer Neg Hx   . Hypertension Daughter        ADVANCED DIRECTIVES:    HEALTH MAINTENANCE: Social History  Substance Use Topics  . Smoking status: Former Smoker    Quit date: 10/08/1988  . Smokeless tobacco: Never Used  . Alcohol Use: 0.0 oz/week    0 Standard drinks or equivalent per week     Comment: RARE     Colonoscopy:  PAP:  Bone density:  Lipid panel:  Allergies  Allergen Reactions  . Glimepiride Other (See Comments)    Leg stiffness  . Lipitor [Atorvastatin Calcium]     Tolerates crestor  . Oxycodone Other (See Comments)    Altered mental state  . Tape   . Latex Rash    Current Outpatient Prescriptions  Medication Sig Dispense Refill  . Blood Glucose Monitoring Suppl (ONE TOUCH ULTRA SYSTEM KIT) W/DEVICE KIT Check blood sugar once daily and as directed. Dx E11.65 1 each 0  . Insulin Glargine (LANTUS SOLOSTAR) 100 UNIT/ML Solostar Pen Inject 5 units  once a day.  Can add 1 unit per day if AM sugar is >150. 5 pen PRN  . lidocaine-prilocaine (EMLA) cream Apply 1 application topically as needed. Apply to port 1 hour prior to chemotherapy appointment, cover with plastic wrap. 30 g 2  . nitroGLYCERIN (NITROSTAT) 0.4 MG SL tablet Place under the tongue.    . prochlorperazine (COMPAZINE) 10 MG tablet Take 1 tablet (10 mg total) by mouth every 6 (six) hours as needed for nausea or vomiting. 30 tablet 2  . warfarin (COUMADIN) 5 MG tablet 7.50m on Monday, Wednesday, and Friday; 584mevery remaining day 30 tablet 5  . carvedilol (COREG) 6.25 MG tablet Take 1 tablet (6.25 mg total) by mouth 2 (two) times daily with a meal. 180 tablet 3  . esomeprazole (NEXIUM) 20 MG capsule Take 1 capsule (20 mg total) by mouth daily as needed (HEARTBURN). 90 capsule 3  . lisinopril (PRINIVIL,ZESTRIL) 10 MG tablet Take 1 tablet (10 mg total) by mouth daily. 90 tablet 3  . metFORMIN (GLUCOPHAGE) 1000 MG tablet Take up to 100046mn the AM and 500m63m the PM.  If GI upset, cut back to 1000mg19may total. 135 tablet 3  . rosuvastatin (CRESTOR) 20 MG tablet Take 0.5 tablets (10 mg total) by mouth daily. 90 tablet 1  . sitaGLIPtin (JANUVIA) 100 MG tablet Take 1 tablet (100 mg total) by mouth daily. 90 tablet 3  . zolpidem (AMBIEN) 10 MG tablet Take 1 tablet (10 mg total) by mouth at bedtime as needed for sleep. 30 tablet 0   No current facility-administered medications for this visit.    OBJECTIVE: Filed Vitals:   11/15/15 0957  BP: 135/72  Pulse: 73  Temp: 96.2 F (35.7 C)  Resp: 16     Body mass index is 25.89 kg/(m^2).    ECOG FS:1 - Symptomatic but completely ambulatory  General: Well-developed, well-nourished, no acute distress. Eyes: Pink conjunctiva, anicteric sclera. Lungs: Clear to auscultation bilaterally. Heart: Regular rate and rhythm. No rubs, murmurs, or gallops. Abdomen: Soft, nontender, nondistended. No organomegaly noted, normoactive bowel  sounds. Musculoskeletal: Minimal bilateral edema, mild erythema of bilateral ankles. Neuro: Alert, answering all questions appropriately. Cranial nerves grossly intact. Skin: No rashes or petechiae noted. Psych: Normal affect.    LAB RESULTS:  Lab Results  Component Value Date   NA 137 11/15/2015   K 3.4* 11/15/2015   CL 105 11/15/2015   CO2 26 11/15/2015   GLUCOSE 196* 11/15/2015   BUN 11 11/15/2015   CREATININE 0.61 11/15/2015   CALCIUM 8.5* 11/15/2015   PROT 6.1* 11/15/2015   ALBUMIN 3.1* 11/15/2015   AST 10* 11/15/2015   ALT 9* 11/15/2015   ALKPHOS 46 11/15/2015   BILITOT 0.4 11/15/2015   GFRNONAA >60 11/15/2015   GFRAA >60 11/15/2015    Lab Results  Component Value Date   WBC 5.4 11/15/2015  NEUTROABS 3.9 11/15/2015   HGB 10.5* 11/15/2015   HCT 31.8* 11/15/2015   MCV 83.4 11/15/2015   PLT 191 11/15/2015   Lab Results  Component Value Date   CA199 659* 11/15/2015    STUDIES: Nm Pet Image Restag (ps) Skull Base To Thigh  11/18/2015  CLINICAL DATA:  Subsequent treatment strategy for restaging of pancreatic cancer. Malignant neoplasm of head of pancreas. EXAM: NUCLEAR MEDICINE PET SKULL BASE TO THIGH TECHNIQUE: 12.1 MCi F-18 FDG was injected intravenously. Full-ring PET imaging was performed from the skull base to thigh after the radiotracer. CT data was obtained and used for attenuation correction and anatomic localization. FASTING BLOOD GLUCOSE:  Value: 123 mg/dl COMPARISON:  09/09/2015 PET. FINDINGS: NECK No areas of abnormal hypermetabolism. CHEST A right cardiophrenic angle node measures 9 mm and a S.U.V. max of 2.7 on image 116/series 3. Enlarged from 5 mm, and not hypermetabolic on the prior exam. ABDOMEN/PELVIS Previous described hepatic dome lesion is subtly apparent at 1.4 cm. Vague hyper metabolism in this area is new including at a S.U.V. max of 3.4 on image 114/series. A node within the gastrohepatic ligament measures 1.0 cm and a S.U.V. max of 4.8 on  image 132/series 3. New. Peripancreatic node within the inferior aspect gastrohepatic ligament measures 1.2 cm and a S.U.V. max of 4.1 on image 136 of series 3. Similar in size and a S.U.V. max of 5.8 on the prior. Mass within the pancreatic neck/ body junction is ill-defined, measuring on the order of 3.5 x 3.4 cm and a S.U.V. max of 5.4 today. Minimally enlarged from 3.4 x 3.1 cm and a S.U.V. max of 7.2 on the prior. Centrally necrotic node versus second pancreatic lesion, along the pancreatic head and uncinate process. 3.6 x 2.7 cm and a S.U.V. max of 5.6 today. Compare 2.8 x 3.5 cm and a S.U.V. max of 5.3 on the prior. Subtle hypermetabolism along the anterior portion of the left lobe of the liver, new. This measures a S.U.V. max of 4.0, including on image 116/series 3. No well-defined parenchymal or peritoneal mass in this area. SKELETON No abnormal marrow activity. CT IMAGES PERFORMED FOR ATTENUATION CORRECTION Bilateral carotid atherosclerosis. Right Port-A-Cath which terminates at the low SVC. Mild cardiomegaly with dense coronary artery atherosclerosis. Trace right pleural fluid. Lower pole left renal collecting system calculus. Extensive colonic diverticulosis. IMPRESSION: 1. Mild progression of disease. 2. New hypermetabolism corresponding to hepatic dome lesion, consistent with progressive metastasis. A new right cardiophrenic angle hypermetabolic node is also highly suspicious for disease progression. 3. Pancreatic primary and surrounding lymph nodes demonstrate primarily similar hypermetabolism. However, these have undergone interval mild enlargement. Further, a gastrohepatic ligament nodal metastasis is new. 4. Vague hypermetabolism along the anterior left hepatic capsule warrants followup attention to exclude CT occult peritoneal metastasis. Electronically Signed   By: Abigail Miyamoto M.D.   On: 11/18/2015 10:22    ASSESSMENT: Stage IV adenocarcinoma the pancreas.  PLAN:    1. Pancreatic cancer:  PET scan results from November 17, 2014 reviewed independently and reported with what appears to be progressive disease. Despite this, patient's CA-19-9 has trended down. Patient received cycle 6, day 8 of gemcitabine and Abraxane prior to these results. Patient will receive this regimen on days 1, 8, and 15 with day 22 off. Patient had consultation with surgery at [facility], but surgery is likely no longer an option given his metastatic disease in his liver. Return to clinic in 1 week for consideration of cycle 6,  day 15. We will likely have to change treatment regimens at that time. 2. Thrombocytopenia: Patient platelet count is within normal limits. Secondary chemotherapy. Monitor.  3. Anemia: Mild, monitor. 4. Hyperglycemia:  Continue diabetic medications as prescribed.  5. DVTs: Likely secondary to decreased activity and malignancy. Continue Coumadin. Patient's INR levels are monitored by his primary care. Goal INR 2.0-3.0. 6. Leg pain: Possibly secondary to deconditioning. Patient was offered physical therapy with the [facility] program, but he has refused. 7. Insomnia: Continue Ambien as needed. 8. Hematuria: Resolved. Continue follow-up with urology as needed.   Patient expressed understanding and was in agreement with this plan. He also understands that He can call clinic at any time with any questions, concerns, or complaints.   Lloyd Huger, MD   11/20/2015 7:01 AM

## 2015-11-21 NOTE — Assessment & Plan Note (Signed)
I would expect his sugar to be up and down, depending on PO intake with treatment.  D/w pt about adjusting his insulin, but not skipping doses.  He agrees.  Recheck in 3 months.

## 2015-11-21 NOTE — Assessment & Plan Note (Signed)
Flu 2016  Shingles 2012  PNA 2016  Tetanus 2012  Colonoscopy 2015  Prostate cancer screening NA, declined.  D/w pt.   Advance directive - wife designated if patient were incapacitated.  Cognitive function addressed- see scanned forms- and if abnormal then additional documentation follows.

## 2015-11-21 NOTE — Assessment & Plan Note (Signed)
I'll await his PET results.  We talked about his goals.  His plans will really hinge on the PET results with possibility of mets affecting treatment options.  No sig abd sx or jaundice at this point.

## 2015-11-22 NOTE — Telephone Encounter (Signed)
Called patient back.  No answer, but did not leave a message.  He has an appt tomorrow.  Thanks.

## 2015-11-23 ENCOUNTER — Inpatient Hospital Stay (HOSPITAL_BASED_OUTPATIENT_CLINIC_OR_DEPARTMENT_OTHER): Payer: PPO | Admitting: Oncology

## 2015-11-23 ENCOUNTER — Telehealth: Payer: Self-pay | Admitting: *Deleted

## 2015-11-23 ENCOUNTER — Inpatient Hospital Stay: Payer: PPO

## 2015-11-23 VITALS — BP 137/75 | HR 90 | Temp 97.2°F | Resp 16 | Wt 162.0 lb

## 2015-11-23 DIAGNOSIS — T451X5S Adverse effect of antineoplastic and immunosuppressive drugs, sequela: Secondary | ICD-10-CM | POA: Diagnosis not present

## 2015-11-23 DIAGNOSIS — Z794 Long term (current) use of insulin: Secondary | ICD-10-CM

## 2015-11-23 DIAGNOSIS — R5383 Other fatigue: Secondary | ICD-10-CM

## 2015-11-23 DIAGNOSIS — G4700 Insomnia, unspecified: Secondary | ICD-10-CM

## 2015-11-23 DIAGNOSIS — C259 Malignant neoplasm of pancreas, unspecified: Secondary | ICD-10-CM

## 2015-11-23 DIAGNOSIS — Z87891 Personal history of nicotine dependence: Secondary | ICD-10-CM

## 2015-11-23 DIAGNOSIS — C787 Secondary malignant neoplasm of liver and intrahepatic bile duct: Principal | ICD-10-CM

## 2015-11-23 DIAGNOSIS — C250 Malignant neoplasm of head of pancreas: Secondary | ICD-10-CM | POA: Diagnosis not present

## 2015-11-23 DIAGNOSIS — R531 Weakness: Secondary | ICD-10-CM

## 2015-11-23 DIAGNOSIS — M79606 Pain in leg, unspecified: Secondary | ICD-10-CM

## 2015-11-23 DIAGNOSIS — D649 Anemia, unspecified: Secondary | ICD-10-CM | POA: Diagnosis not present

## 2015-11-23 DIAGNOSIS — E1165 Type 2 diabetes mellitus with hyperglycemia: Secondary | ICD-10-CM

## 2015-11-23 DIAGNOSIS — D6959 Other secondary thrombocytopenia: Secondary | ICD-10-CM | POA: Diagnosis not present

## 2015-11-23 DIAGNOSIS — Z5111 Encounter for antineoplastic chemotherapy: Secondary | ICD-10-CM | POA: Diagnosis not present

## 2015-11-23 DIAGNOSIS — Z7984 Long term (current) use of oral hypoglycemic drugs: Secondary | ICD-10-CM

## 2015-11-23 LAB — CBC WITH DIFFERENTIAL/PLATELET
BASOS ABS: 0 10*3/uL (ref 0–0.1)
Basophils Relative: 1 %
EOS PCT: 1 %
Eosinophils Absolute: 0 10*3/uL (ref 0–0.7)
HCT: 34.6 % — ABNORMAL LOW (ref 40.0–52.0)
Hemoglobin: 11.3 g/dL — ABNORMAL LOW (ref 13.0–18.0)
LYMPHS PCT: 17 %
Lymphs Abs: 0.9 10*3/uL — ABNORMAL LOW (ref 1.0–3.6)
MCH: 27 pg (ref 26.0–34.0)
MCHC: 32.7 g/dL (ref 32.0–36.0)
MCV: 82.6 fL (ref 80.0–100.0)
Monocytes Absolute: 0.4 10*3/uL (ref 0.2–1.0)
Monocytes Relative: 8 %
NEUTROS ABS: 3.8 10*3/uL (ref 1.4–6.5)
Neutrophils Relative %: 73 %
PLATELETS: 124 10*3/uL — AB (ref 150–440)
RBC: 4.2 MIL/uL — AB (ref 4.40–5.90)
RDW: 18.9 % — ABNORMAL HIGH (ref 11.5–14.5)
WBC: 5.2 10*3/uL (ref 3.8–10.6)

## 2015-11-23 LAB — COMPREHENSIVE METABOLIC PANEL
ALT: 14 U/L — AB (ref 17–63)
AST: 15 U/L (ref 15–41)
Albumin: 3.6 g/dL (ref 3.5–5.0)
Alkaline Phosphatase: 45 U/L (ref 38–126)
Anion gap: 5 (ref 5–15)
BUN: 10 mg/dL (ref 6–20)
CHLORIDE: 104 mmol/L (ref 101–111)
CO2: 27 mmol/L (ref 22–32)
CREATININE: 0.66 mg/dL (ref 0.61–1.24)
Calcium: 8.9 mg/dL (ref 8.9–10.3)
GFR calc Af Amer: >60 mL/min (ref >60)
GLUCOSE: 245 mg/dL — AB (ref 65–99)
Potassium: 3.5 mmol/L (ref 3.5–5.1)
Sodium: 136 mmol/L (ref 135–145)
Total Bilirubin: 0.6 mg/dL (ref 0.3–1.2)
Total Protein: 6.5 g/dL (ref 6.5–8.1)

## 2015-11-23 MED ORDER — SODIUM CHLORIDE 0.9 % IV SOLN
Freq: Once | INTRAVENOUS | Status: AC
  Administered 2015-11-23: 11:00:00 via INTRAVENOUS
  Filled 2015-11-23: qty 4

## 2015-11-23 MED ORDER — SODIUM CHLORIDE 0.9 % IV SOLN
Freq: Once | INTRAVENOUS | Status: AC
  Administered 2015-11-23: 10:00:00 via INTRAVENOUS
  Filled 2015-11-23: qty 1000

## 2015-11-23 MED ORDER — SODIUM CHLORIDE 0.9 % IV SOLN
2000.0000 mg | Freq: Once | INTRAVENOUS | Status: AC
  Administered 2015-11-23: 2000 mg via INTRAVENOUS
  Filled 2015-11-23: qty 52.6

## 2015-11-23 MED ORDER — PACLITAXEL PROTEIN-BOUND CHEMO INJECTION 100 MG
125.0000 mg/m2 | Freq: Once | INTRAVENOUS | Status: AC
  Administered 2015-11-23: 250 mg via INTRAVENOUS
  Filled 2015-11-23: qty 50

## 2015-11-23 MED ORDER — SODIUM CHLORIDE 0.9 % IJ SOLN
10.0000 mL | INTRAMUSCULAR | Status: DC | PRN
  Filled 2015-11-23: qty 10

## 2015-11-23 MED ORDER — HEPARIN SOD (PORK) LOCK FLUSH 100 UNIT/ML IV SOLN
500.0000 [IU] | Freq: Once | INTRAVENOUS | Status: AC | PRN
  Administered 2015-11-23: 500 [IU]
  Filled 2015-11-23: qty 5

## 2015-11-23 NOTE — Progress Notes (Signed)
Patient had an episode of nausea and vomiting but that was after he had eaten greasy foods.

## 2015-11-23 NOTE — Telephone Encounter (Signed)
Received paperwork from [facility] regarding coverage determination for Esomeprazole 20. Paperwork is on your desk

## 2015-11-24 ENCOUNTER — Telehealth: Payer: Self-pay

## 2015-11-24 LAB — CANCER ANTIGEN 19-9: CA 19-9: 722 U/mL — ABNORMAL HIGH (ref 0–35)

## 2015-11-24 NOTE — Telephone Encounter (Signed)
  Oncology Nurse Navigator Documentation  Navigator Location: [facility] (11/24/15 1300) Navigator Encounter Type: Telephone (11/24/15 1300) Telephone: Outgoing Call (11/24/15 1300)                                        Time Spent with Patient: 15 (11/24/15 1300)   Received call asking for PET results to be discussed with Dr Crisoforo Oxford. Per Dr Grayland Ormond I have faxed the report and I asked Dr Crisoforo Oxford to call him regarding his surgical option at this time. Mrs Frieder notified.

## 2015-11-25 NOTE — Telephone Encounter (Signed)
PPW faxed to [facility], awaiting response.

## 2015-11-25 NOTE — Telephone Encounter (Signed)
Form done. Thanks. 

## 2015-11-28 NOTE — Telephone Encounter (Signed)
Approval letter received and sent for scanning.

## 2015-12-02 ENCOUNTER — Telehealth: Payer: Self-pay

## 2015-12-02 ENCOUNTER — Telehealth: Payer: Self-pay | Admitting: *Deleted

## 2015-12-02 MED ORDER — WARFARIN SODIUM 5 MG PO TABS: ORAL_TABLET | ORAL | Status: DC

## 2015-12-02 NOTE — Telephone Encounter (Signed)
Pt wife called to report pt weight of 161lbs without clothing. Pt has pancreatic cancer and was told by Dr. Rockey Situ to contact the office if weight decreased significantly to determine if med adjustments are needed. He weighed 162lbs at 11/23/15 OV but wife states he was 175lbs in September. She states his appetite has decreased over past 2-3 days.  Informed wife I will make MD aware and CB with any suggestions/med adjustments.

## 2015-12-03 MED ORDER — LOPERAMIDE HCL 2 MG PO TABS: 2.0000 mg | ORAL_TABLET | Freq: Three times a day (TID) | ORAL | Status: DC | PRN

## 2015-12-03 NOTE — Progress Notes (Signed)
[place]  [phone] [phone]  ID: Ronnie Johnson OB: Feb 21, 1941  MR#: [id number]  EXH#:[id number]  Patient Care Team: Tonia Ghent, MD as PCP - General (Family Medicine) Hessie Knows, MD as Referring Physician (Orthopedic Surgery) Clent Jacks, RN as Registered Nurse  CHIEF COMPLAINT:  Chief Complaint  Patient presents with  . Pancreatic Cancer    INTERVAL HISTORY: Patient returns to clinic today for consideration of cycle 6, day 15 of gemcitabine and Abraxane as well as discussion of his imaging results. Patient has increased weakness and fatigue, but otherwise feels well.  He has no neurologic complaints. He denies any easy bleeding or bruising. He denies any fevers. He denies any chest pain or shortness of breath. He denies nausea, vomiting, constipation, or diarrhea. He no longer complains of hematuria.  Patient offers no further specific complaints today.  REVIEW OF SYSTEMS:   Review of Systems  Constitutional: Positive for malaise/fatigue. Negative for fever and weight loss.  Eyes: Negative for pain, discharge and redness.  Cardiovascular: Negative for leg swelling.  Gastrointestinal: Negative for nausea and abdominal pain.  Genitourinary: Negative for hematuria.  Musculoskeletal: Negative for myalgias.  Neurological: Positive for weakness.  Endo/Heme/Allergies: Does not bruise/bleed easily.  Psychiatric/Behavioral: The patient has insomnia.     As per HPI. Otherwise, a complete review of systems is negatve.  PAST MEDICAL HISTORY: Past Medical History  Diagnosis Date  . Hypertension   . DVT of leg (deep venous thrombosis) ([place]) 2009  . Nephrolithiasis   . Hyperlipidemia   . Allergic rhinitis   . CAD (coronary artery disease)   . Urethral diverticulum   . Varicose vein of leg   . Dupuytren's contracture   . ED (erectile dysfunction)   . History of colonic polyps   . GERD (gastroesophageal reflux disease)   .  Diverticulosis of colon   . Myocardial infarction ([place])   . DVT (deep venous thrombosis) ([place])   . Polio 1948    was hospitalized for 1 year  . Diabetes mellitus, type 2 ([place])     Patient takes Metformin.  . SCC (squamous cell carcinoma)     scalp 2013 per derm    PAST SURGICAL HISTORY: Past Surgical History  Procedure Laterality Date  . Appendectomy  1994  . Umbilical hernia repair  03/14/2005  . 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 Replacement total knee Left 2016  . Peripheral vascular catheterization N/A 06/23/2015    Procedure: [name];  Surgeon: Algernon Huxley, MD;  Location: [facility];  Service: Cardiovascular;  Laterality: N/A;    FAMILY HISTORY Family History  Problem Relation Age of Onset  . Diabetes Mother   . Hypertension Mother   . Stroke Mother   . Alzheimer's disease Mother   . Heart attack Father   . Hypertension Father   . Prostate cancer Brother   . Diabetes Brother   . Hypertension Brother   . Diabetes Sister   . Colon cancer Neg Hx   . Hypertension Daughter        ADVANCED DIRECTIVES:    HEALTH MAINTENANCE: Social History  Substance Use Topics  . Smoking status: Former Smoker    Quit date: 10/08/1988  . Smokeless tobacco: Never Used  . Alcohol Use: 0.0 oz/week    0 Standard drinks or equivalent per week     Comment: RARE     Colonoscopy:  PAP:  Bone density:  Lipid panel:  Allergies  Allergen Reactions  . Glimepiride Other (See Comments)    Leg stiffness  . Lipitor [Atorvastatin Calcium]     Tolerates crestor  . Oxycodone Other (See Comments)    Altered mental state  . Tape   . Latex Rash    Current Outpatient Prescriptions  Medication Sig Dispense Refill  . Blood Glucose Monitoring Suppl (ONE TOUCH ULTRA SYSTEM KIT) W/DEVICE KIT Check blood sugar once daily and as directed. Dx E11.65 1 each 0  . carvedilol (COREG) 6.25 MG tablet Take 1 tablet (6.25 mg total) by mouth 2 (two) times daily with  a meal. 180 tablet 3  . esomeprazole (NEXIUM) 20 MG capsule Take 1 capsule (20 mg total) by mouth daily as needed (HEARTBURN). 90 capsule 3  . Insulin Glargine (LANTUS SOLOSTAR) 100 UNIT/ML Solostar Pen Inject 5 units once a day.  Can add 1 unit per day if AM sugar is >150. 5 pen PRN  . lidocaine-prilocaine (EMLA) cream Apply 1 application topically as needed. Apply to port 1 hour prior to chemotherapy appointment, cover with plastic wrap. 30 g 2  . lisinopril (PRINIVIL,ZESTRIL) 10 MG tablet Take 1 tablet (10 mg total) by mouth daily. 90 tablet 3  . metFORMIN (GLUCOPHAGE) 1000 MG tablet Take up to 1067m in the AM and 5052min the PM.  If GI upset, cut back to 100024m day total. 135 tablet 3  . nitroGLYCERIN (NITROSTAT) 0.4 MG SL tablet Place under the tongue.    . prochlorperazine (COMPAZINE) 10 MG tablet Take 1 tablet (10 mg total) by mouth every 6 (six) hours as needed for nausea or vomiting. 30 tablet 2  . rosuvastatin (CRESTOR) 20 MG tablet Take 0.5 tablets (10 mg total) by mouth daily. 90 tablet 1  . sitaGLIPtin (JANUVIA) 100 MG tablet Take 1 tablet (100 mg total) by mouth daily. 90 tablet 3  . warfarin (COUMADIN) 5 MG tablet Take as directed by anti-coagulation clinic. 140 tablet 0  . zolpidem (AMBIEN) 10 MG tablet Take 1 tablet (10 mg total) by mouth at bedtime as needed for sleep. 30 tablet 0   No current facility-administered medications for this visit.    OBJECTIVE: Filed Vitals:   11/23/15 1006  BP: 137/75  Pulse: 90  Temp: 97.2 F (36.2 C)  Resp: 16     Body mass index is 26.77 kg/(m^2).    ECOG FS:1 - Symptomatic but completely ambulatory  General: Well-developed, well-nourished, no acute distress. Eyes: Pink conjunctiva, anicteric sclera. Lungs: Clear to auscultation bilaterally. Heart: Regular rate and rhythm. No rubs, murmurs, or gallops. Abdomen: Soft, nontender, nondistended. No organomegaly noted, normoactive bowel sounds. Musculoskeletal: Minimal bilateral edema,  mild erythema of bilateral ankles. Neuro: Alert, answering all questions appropriately. Cranial nerves grossly intact. Skin: No rashes or petechiae noted. Psych: Normal affect.    LAB RESULTS:  Lab Results  Component Value Date   NA 136 11/23/2015   K 3.5 11/23/2015   CL 104 11/23/2015   CO2 27 11/23/2015   GLUCOSE 245* 11/23/2015   BUN 10 11/23/2015   CREATININE 0.66 11/23/2015   CALCIUM 8.9 11/23/2015   PROT 6.5 11/23/2015   ALBUMIN 3.6 11/23/2015   AST 15 11/23/2015   ALT 14* 11/23/2015   ALKPHOS 45 11/23/2015   BILITOT 0.6 11/23/2015   GFRNONAA >60 11/23/2015   GFRAA >60 11/23/2015    Lab Results  Component Value Date   WBC 5.2 11/23/2015   NEUTROABS 3.8 11/23/2015   HGB 11.3* 11/23/2015  HCT 34.6* 11/23/2015   MCV 82.6 11/23/2015   PLT 124* 11/23/2015   Lab Results  Component Value Date   CA199 722* 11/23/2015    STUDIES: Nm Pet Image Restag (ps) Skull Base To Thigh  11/18/2015  CLINICAL DATA:  Subsequent treatment strategy for restaging of pancreatic cancer. Malignant neoplasm of head of pancreas. EXAM: NUCLEAR MEDICINE PET SKULL BASE TO THIGH TECHNIQUE: 12.1 MCi F-18 FDG was injected intravenously. Full-ring PET imaging was performed from the skull base to thigh after the radiotracer. CT data was obtained and used for attenuation correction and anatomic localization. FASTING BLOOD GLUCOSE:  Value: 123 mg/dl COMPARISON:  09/09/2015 PET. FINDINGS: NECK No areas of abnormal hypermetabolism. CHEST A right cardiophrenic angle node measures 9 mm and a S.U.V. max of 2.7 on image 116/series 3. Enlarged from 5 mm, and not hypermetabolic on the prior exam. ABDOMEN/PELVIS Previous described hepatic dome lesion is subtly apparent at 1.4 cm. Vague hyper metabolism in this area is new including at a S.U.V. max of 3.4 on image 114/series. A node within the gastrohepatic ligament measures 1.0 cm and a S.U.V. max of 4.8 on image 132/series 3. New. Peripancreatic node within the  inferior aspect gastrohepatic ligament measures 1.2 cm and a S.U.V. max of 4.1 on image 136 of series 3. Similar in size and a S.U.V. max of 5.8 on the prior. Mass within the pancreatic neck/ body junction is ill-defined, measuring on the order of 3.5 x 3.4 cm and a S.U.V. max of 5.4 today. Minimally enlarged from 3.4 x 3.1 cm and a S.U.V. max of 7.2 on the prior. Centrally necrotic node versus second pancreatic lesion, along the pancreatic head and uncinate process. 3.6 x 2.7 cm and a S.U.V. max of 5.6 today. Compare 2.8 x 3.5 cm and a S.U.V. max of 5.3 on the prior. Subtle hypermetabolism along the anterior portion of the left lobe of the liver, new. This measures a S.U.V. max of 4.0, including on image 116/series 3. No well-defined parenchymal or peritoneal mass in this area. SKELETON No abnormal marrow activity. CT IMAGES PERFORMED FOR ATTENUATION CORRECTION Bilateral carotid atherosclerosis. Right Port-A-Cath which terminates at the low SVC. Mild cardiomegaly with dense coronary artery atherosclerosis. Trace right pleural fluid. Lower pole left renal collecting system calculus. Extensive colonic diverticulosis. IMPRESSION: 1. Mild progression of disease. 2. New hypermetabolism corresponding to hepatic dome lesion, consistent with progressive metastasis. A new right cardiophrenic angle hypermetabolic node is also highly suspicious for disease progression. 3. Pancreatic primary and surrounding lymph nodes demonstrate primarily similar hypermetabolism. However, these have undergone interval mild enlargement. Further, a gastrohepatic ligament nodal metastasis is new. 4. Vague hypermetabolism along the anterior left hepatic capsule warrants followup attention to exclude CT occult peritoneal metastasis. Electronically Signed   By: Abigail Miyamoto M.D.   On: 11/18/2015 10:22    ASSESSMENT: Stage IV adenocarcinoma the pancreas.  PLAN:    1. Pancreatic cancer: PET scan results from November 17, 2014 reviewed  independently and reported with what appears to be progressive disease. Patient's CA-19-9 is slowly increasing as well. After lengthy discussion with the patient, he wishes to proceed with cycle 6, day 15 of gemcitabine and Abraxane today. We will then switch his treatment regimen to irinotecan and 5-FU. Patient is likely no longer a surgical candidate. Return to clinic in 2 weeks for consideration of cycle 1 of irinotecan and 5-FU.   2. Thrombocytopenia: Mild. Secondary chemotherapy. Monitor.  3. Anemia: Mild, monitor. 4. Hyperglycemia:  Continue diabetic medications as  prescribed.  5. DVTs: Likely secondary to decreased activity and malignancy. Continue Coumadin. Patient's INR levels are monitored by his primary care. Goal INR 2.0-3.0. 6. Leg pain: Possibly secondary to deconditioning. Patient was offered physical therapy with the [facility] program, but he has refused. 7. Insomnia: Continue Ambien as needed. 8. Hematuria: Resolved. Continue follow-up with urology as needed.   Patient expressed understanding and was in agreement with this plan. He also understands that He can call clinic at any time with any questions, concerns, or complaints.   Lloyd Huger, MD   12/03/2015 12:29 PM

## 2015-12-04 NOTE — Telephone Encounter (Signed)
We would adjust medications if blood pressure is dropping with his weight Need BP measurements

## 2015-12-05 NOTE — Telephone Encounter (Signed)
Spoke to wife and verified that patient is taking Coumadin as directed at last appointment.

## 2015-12-05 NOTE — Telephone Encounter (Signed)
Reviewed MD recommendations w/pt who verbalized understanding. He will continue to monitor weight and BP and report if he becomes hypotensive. Confirmed April appt

## 2015-12-05 NOTE — Telephone Encounter (Signed)
Called patient and wife to discuss current medication directions.  Left message to return call.

## 2015-12-06 ENCOUNTER — Inpatient Hospital Stay: Payer: PPO

## 2015-12-06 ENCOUNTER — Inpatient Hospital Stay (HOSPITAL_BASED_OUTPATIENT_CLINIC_OR_DEPARTMENT_OTHER): Payer: PPO

## 2015-12-06 ENCOUNTER — Inpatient Hospital Stay (HOSPITAL_BASED_OUTPATIENT_CLINIC_OR_DEPARTMENT_OTHER): Payer: PPO | Admitting: Oncology

## 2015-12-06 ENCOUNTER — Telehealth: Payer: Self-pay | Admitting: Pharmacist

## 2015-12-06 ENCOUNTER — Encounter: Payer: Self-pay | Admitting: Oncology

## 2015-12-06 ENCOUNTER — Ambulatory Visit: Payer: PPO | Admitting: Podiatry

## 2015-12-06 VITALS — BP 123/72 | HR 73 | Temp 98.4°F | Wt 167.7 lb

## 2015-12-06 DIAGNOSIS — Z86718 Personal history of other venous thrombosis and embolism: Secondary | ICD-10-CM

## 2015-12-06 DIAGNOSIS — R531 Weakness: Secondary | ICD-10-CM

## 2015-12-06 DIAGNOSIS — Z794 Long term (current) use of insulin: Secondary | ICD-10-CM

## 2015-12-06 DIAGNOSIS — C259 Malignant neoplasm of pancreas, unspecified: Secondary | ICD-10-CM

## 2015-12-06 DIAGNOSIS — D6959 Other secondary thrombocytopenia: Secondary | ICD-10-CM

## 2015-12-06 DIAGNOSIS — C250 Malignant neoplasm of head of pancreas: Secondary | ICD-10-CM | POA: Diagnosis not present

## 2015-12-06 DIAGNOSIS — G4700 Insomnia, unspecified: Secondary | ICD-10-CM

## 2015-12-06 DIAGNOSIS — M79606 Pain in leg, unspecified: Secondary | ICD-10-CM

## 2015-12-06 DIAGNOSIS — T451X5S Adverse effect of antineoplastic and immunosuppressive drugs, sequela: Secondary | ICD-10-CM

## 2015-12-06 DIAGNOSIS — D649 Anemia, unspecified: Secondary | ICD-10-CM

## 2015-12-06 DIAGNOSIS — Z5111 Encounter for antineoplastic chemotherapy: Secondary | ICD-10-CM | POA: Diagnosis not present

## 2015-12-06 DIAGNOSIS — E1165 Type 2 diabetes mellitus with hyperglycemia: Secondary | ICD-10-CM

## 2015-12-06 DIAGNOSIS — Z7984 Long term (current) use of oral hypoglycemic drugs: Secondary | ICD-10-CM

## 2015-12-06 DIAGNOSIS — Z7901 Long term (current) use of anticoagulants: Secondary | ICD-10-CM

## 2015-12-06 DIAGNOSIS — C787 Secondary malignant neoplasm of liver and intrahepatic bile duct: Principal | ICD-10-CM

## 2015-12-06 DIAGNOSIS — Z95828 Presence of other vascular implants and grafts: Secondary | ICD-10-CM

## 2015-12-06 DIAGNOSIS — R5383 Other fatigue: Secondary | ICD-10-CM

## 2015-12-06 LAB — COMPREHENSIVE METABOLIC PANEL
ALBUMIN: 3.5 g/dL (ref 3.5–5.0)
ALT: 9 U/L — ABNORMAL LOW (ref 17–63)
ANION GAP: 5 (ref 5–15)
AST: 10 U/L — ABNORMAL LOW (ref 15–41)
Alkaline Phosphatase: 45 U/L (ref 38–126)
BILIRUBIN TOTAL: 0.4 mg/dL (ref 0.3–1.2)
BUN: 10 mg/dL (ref 6–20)
CO2: 26 mmol/L (ref 22–32)
Calcium: 8.5 mg/dL — ABNORMAL LOW (ref 8.9–10.3)
Chloride: 104 mmol/L (ref 101–111)
Creatinine, Ser: 0.64 mg/dL (ref 0.61–1.24)
GFR calc Af Amer: >60 mL/min (ref >60)
Glucose, Bld: 248 mg/dL — ABNORMAL HIGH (ref 65–99)
POTASSIUM: 3.4 mmol/L — AB (ref 3.5–5.1)
Sodium: 135 mmol/L (ref 135–145)
TOTAL PROTEIN: 6.2 g/dL — AB (ref 6.5–8.1)

## 2015-12-06 LAB — CBC WITH DIFFERENTIAL/PLATELET
BASOS PCT: 1 %
Basophils Absolute: 0 10*3/uL (ref 0–0.1)
Eosinophils Absolute: 0.1 10*3/uL (ref 0–0.7)
Eosinophils Relative: 2 %
HEMATOCRIT: 32.7 % — AB (ref 40.0–52.0)
Hemoglobin: 10.7 g/dL — ABNORMAL LOW (ref 13.0–18.0)
Lymphocytes Relative: 15 %
Lymphs Abs: 0.7 10*3/uL — ABNORMAL LOW (ref 1.0–3.6)
MCH: 26.9 pg (ref 26.0–34.0)
MCHC: 32.6 g/dL (ref 32.0–36.0)
MCV: 82.4 fL (ref 80.0–100.0)
MONO ABS: 0.6 10*3/uL (ref 0.2–1.0)
MONOS PCT: 13 %
NEUTROS ABS: 3.2 10*3/uL (ref 1.4–6.5)
Neutrophils Relative %: 69 %
Platelets: 205 10*3/uL (ref 150–440)
RBC: 3.97 MIL/uL — ABNORMAL LOW (ref 4.40–5.90)
RDW: 18.7 % — AB (ref 11.5–14.5)
WBC: 4.7 10*3/uL (ref 3.8–10.6)

## 2015-12-06 MED ORDER — LEUCOVORIN CALCIUM INJECTION 350 MG
700.0000 mg | Freq: Once | INTRAVENOUS | Status: AC
  Administered 2015-12-06: 700 mg via INTRAVENOUS
  Filled 2015-12-06: qty 25

## 2015-12-06 MED ORDER — SODIUM CHLORIDE 0.9 % IV SOLN
70.0000 mg/m2 | Freq: Once | INTRAVENOUS | Status: AC
  Administered 2015-12-06: 129 mg via INTRAVENOUS
  Filled 2015-12-06: qty 30

## 2015-12-06 MED ORDER — HEPARIN SOD (PORK) LOCK FLUSH 100 UNIT/ML IV SOLN: 500.0000 [IU] | Freq: Once | INTRAVENOUS | Status: AC

## 2015-12-06 MED ORDER — SODIUM CHLORIDE 0.9% FLUSH
10.0000 mL | INTRAVENOUS | Status: AC | PRN
  Administered 2015-12-06: 10 mL via INTRAVENOUS
  Filled 2015-12-06: qty 10

## 2015-12-06 MED ORDER — PROCHLORPERAZINE MALEATE 10 MG PO TABS
10.0000 mg | ORAL_TABLET | Freq: Once | ORAL | Status: AC
  Administered 2015-12-06: 10 mg via ORAL
  Filled 2015-12-06: qty 1

## 2015-12-06 MED ORDER — SODIUM CHLORIDE 0.9 % IV SOLN
Freq: Once | INTRAVENOUS | Status: AC
  Administered 2015-12-06: 11:00:00 via INTRAVENOUS
  Filled 2015-12-06: qty 1000

## 2015-12-06 MED ORDER — SODIUM CHLORIDE 0.9 % IV SOLN
2400.0000 mg/m2 | INTRAVENOUS | Status: DC
  Administered 2015-12-06: 4400 mg via INTRAVENOUS
  Filled 2015-12-06: qty 88

## 2015-12-06 NOTE — Telephone Encounter (Signed)
Checked with Dr. Grayland Ormond. No 5FU push with this regimen.

## 2015-12-07 LAB — CANCER ANTIGEN 19-9: CA 19-9: 882 U/mL — ABNORMAL HIGH (ref 0–35)

## 2015-12-08 ENCOUNTER — Encounter: Payer: Self-pay | Admitting: Podiatry

## 2015-12-08 ENCOUNTER — Inpatient Hospital Stay: Payer: PPO | Attending: Oncology

## 2015-12-08 ENCOUNTER — Ambulatory Visit (INDEPENDENT_AMBULATORY_CARE_PROVIDER_SITE_OTHER): Payer: PPO | Admitting: Podiatry

## 2015-12-08 DIAGNOSIS — I10 Essential (primary) hypertension: Secondary | ICD-10-CM | POA: Diagnosis not present

## 2015-12-08 DIAGNOSIS — Z7984 Long term (current) use of oral hypoglycemic drugs: Secondary | ICD-10-CM | POA: Diagnosis not present

## 2015-12-08 DIAGNOSIS — R110 Nausea: Secondary | ICD-10-CM | POA: Insufficient documentation

## 2015-12-08 DIAGNOSIS — Z87891 Personal history of nicotine dependence: Secondary | ICD-10-CM | POA: Insufficient documentation

## 2015-12-08 DIAGNOSIS — Z79899 Other long term (current) drug therapy: Secondary | ICD-10-CM | POA: Insufficient documentation

## 2015-12-08 DIAGNOSIS — K219 Gastro-esophageal reflux disease without esophagitis: Secondary | ICD-10-CM | POA: Insufficient documentation

## 2015-12-08 DIAGNOSIS — R5383 Other fatigue: Secondary | ICD-10-CM | POA: Diagnosis not present

## 2015-12-08 DIAGNOSIS — C259 Malignant neoplasm of pancreas, unspecified: Secondary | ICD-10-CM

## 2015-12-08 DIAGNOSIS — C250 Malignant neoplasm of head of pancreas: Secondary | ICD-10-CM | POA: Diagnosis not present

## 2015-12-08 DIAGNOSIS — R630 Anorexia: Secondary | ICD-10-CM | POA: Diagnosis not present

## 2015-12-08 DIAGNOSIS — D649 Anemia, unspecified: Secondary | ICD-10-CM | POA: Insufficient documentation

## 2015-12-08 DIAGNOSIS — I252 Old myocardial infarction: Secondary | ICD-10-CM | POA: Insufficient documentation

## 2015-12-08 DIAGNOSIS — Z7901 Long term (current) use of anticoagulants: Secondary | ICD-10-CM | POA: Diagnosis not present

## 2015-12-08 DIAGNOSIS — M79676 Pain in unspecified toe(s): Secondary | ICD-10-CM | POA: Diagnosis not present

## 2015-12-08 DIAGNOSIS — Z86718 Personal history of other venous thrombosis and embolism: Secondary | ICD-10-CM | POA: Insufficient documentation

## 2015-12-08 DIAGNOSIS — M79606 Pain in leg, unspecified: Secondary | ICD-10-CM | POA: Insufficient documentation

## 2015-12-08 DIAGNOSIS — F419 Anxiety disorder, unspecified: Secondary | ICD-10-CM | POA: Diagnosis not present

## 2015-12-08 DIAGNOSIS — I2510 Atherosclerotic heart disease of native coronary artery without angina pectoris: Secondary | ICD-10-CM | POA: Diagnosis not present

## 2015-12-08 DIAGNOSIS — B351 Tinea unguium: Secondary | ICD-10-CM | POA: Diagnosis not present

## 2015-12-08 DIAGNOSIS — R531 Weakness: Secondary | ICD-10-CM | POA: Diagnosis not present

## 2015-12-08 DIAGNOSIS — Z86010 Personal history of colonic polyps: Secondary | ICD-10-CM | POA: Diagnosis not present

## 2015-12-08 DIAGNOSIS — Z8042 Family history of malignant neoplasm of prostate: Secondary | ICD-10-CM | POA: Diagnosis not present

## 2015-12-08 DIAGNOSIS — N529 Male erectile dysfunction, unspecified: Secondary | ICD-10-CM | POA: Diagnosis not present

## 2015-12-08 DIAGNOSIS — G4700 Insomnia, unspecified: Secondary | ICD-10-CM | POA: Insufficient documentation

## 2015-12-08 DIAGNOSIS — Z794 Long term (current) use of insulin: Secondary | ICD-10-CM | POA: Insufficient documentation

## 2015-12-08 DIAGNOSIS — E1165 Type 2 diabetes mellitus with hyperglycemia: Secondary | ICD-10-CM | POA: Insufficient documentation

## 2015-12-08 DIAGNOSIS — R5381 Other malaise: Secondary | ICD-10-CM | POA: Insufficient documentation

## 2015-12-08 DIAGNOSIS — C787 Secondary malignant neoplasm of liver and intrahepatic bile duct: Secondary | ICD-10-CM

## 2015-12-08 MED ORDER — SODIUM CHLORIDE 0.9% FLUSH
10.0000 mL | INTRAVENOUS | Status: DC | PRN
  Administered 2015-12-08: 10 mL
  Filled 2015-12-08: qty 10

## 2015-12-08 MED ORDER — HEPARIN SOD (PORK) LOCK FLUSH 100 UNIT/ML IV SOLN
500.0000 [IU] | Freq: Once | INTRAVENOUS | Status: AC | PRN
  Administered 2015-12-08: 500 [IU]

## 2015-12-08 MED ORDER — HEPARIN SOD (PORK) LOCK FLUSH 100 UNIT/ML IV SOLN
INTRAVENOUS | Status: AC
  Filled 2015-12-08: qty 5

## 2015-12-09 ENCOUNTER — Telehealth: Payer: Self-pay

## 2015-12-09 NOTE — Telephone Encounter (Signed)
Ronnie Johnson (DPR signed) wants to know when pt started on Januvia; advised 04/08/2015. Ronnie Johnson then asked for Lugene to call her; advised Lugene was assisting pts in office and I would be glad to help her; Ronnie Johnson said she did not feel comfortable speaking with someone she did not know and to ask Lugene to call her.

## 2015-12-09 NOTE — Telephone Encounter (Signed)
Ronnie Johnson says she saw on TV that there is a Health visitor for [employer] causing pancreatitis and possibly pancreatic cancer.  She is concerned about this because that's about the time that Geoffery was diagnosed with cancer.  She wanted you to know about this lawsuit if you didn't know already.  Ronnie Johnson says that Jarvin cannot have the surgery but is going to see Dr. Bridgett Larsson on Monday and Tuesday to help them make some decisions and get info that they couldn't seem to get in [place].

## 2015-12-09 NOTE — Telephone Encounter (Signed)
Called and LMOVM x2.  I didn't leave confidential information.  Please try to get up with her on Monday.  I am aware of her concerns.  It is likely (in retrospect) that his sugars were worsening because of the (likely already formed but at that point unknown) cancer, leading to the start of the [place].  I can't imagine that the cancer would form quickly enough after the start of the medicine to cause the months of weight loss that he was already having.   Her point is noted, but I can't see how it would have contributed.  I'll await the follow up notes and I will continue to be thinking about them.  Thanks.

## 2015-12-12 ENCOUNTER — Ambulatory Visit (INDEPENDENT_AMBULATORY_CARE_PROVIDER_SITE_OTHER): Payer: PPO | Admitting: *Deleted

## 2015-12-12 ENCOUNTER — Ambulatory Visit: Payer: PPO

## 2015-12-12 DIAGNOSIS — I82403 Acute embolism and thrombosis of unspecified deep veins of lower extremity, bilateral: Secondary | ICD-10-CM | POA: Diagnosis not present

## 2015-12-12 DIAGNOSIS — Z5181 Encounter for therapeutic drug level monitoring: Secondary | ICD-10-CM | POA: Diagnosis not present

## 2015-12-12 LAB — POCT INR: INR: 5.6

## 2015-12-12 NOTE — Progress Notes (Signed)
Pre visit review using our clinic review tool, if applicable. No additional management support is needed unless otherwise documented below in the visit note. 

## 2015-12-12 NOTE — Progress Notes (Signed)
INR is supra therapeutic today, likely related to recent changes in chemotherapy.  Patient denies any other medication changes or extra doses.  Will hold Coumadin x2 days, then being taking 7.5 mg daily except 5 mg on Mondays and Fridays.  Patient encouraged to eat a good serving of greens over the next 2-3 days.  Will recheck in 1 week and make additional changes at that time if needed.

## 2015-12-13 ENCOUNTER — Inpatient Hospital Stay: Payer: PPO

## 2015-12-13 DIAGNOSIS — C250 Malignant neoplasm of head of pancreas: Secondary | ICD-10-CM | POA: Diagnosis not present

## 2015-12-13 DIAGNOSIS — C259 Malignant neoplasm of pancreas, unspecified: Secondary | ICD-10-CM

## 2015-12-13 DIAGNOSIS — C787 Secondary malignant neoplasm of liver and intrahepatic bile duct: Principal | ICD-10-CM

## 2015-12-13 LAB — COMPREHENSIVE METABOLIC PANEL
ALBUMIN: 4 g/dL (ref 3.5–5.0)
ALK PHOS: 47 U/L (ref 38–126)
ALT: 9 U/L — ABNORMAL LOW (ref 17–63)
ANION GAP: 5 (ref 5–15)
AST: 12 U/L — AB (ref 15–41)
BILIRUBIN TOTAL: 0.5 mg/dL (ref 0.3–1.2)
BUN: 10 mg/dL (ref 6–20)
CO2: 27 mmol/L (ref 22–32)
Calcium: 8.9 mg/dL (ref 8.9–10.3)
Chloride: 103 mmol/L (ref 101–111)
Creatinine, Ser: 0.68 mg/dL (ref 0.61–1.24)
GFR calc Af Amer: >60 mL/min (ref >60)
GFR calc non Af Amer: >60 mL/min (ref >60)
GLUCOSE: 216 mg/dL — AB (ref 65–99)
POTASSIUM: 4.4 mmol/L (ref 3.5–5.1)
SODIUM: 135 mmol/L (ref 135–145)
TOTAL PROTEIN: 6.9 g/dL (ref 6.5–8.1)

## 2015-12-13 LAB — CBC WITH DIFFERENTIAL/PLATELET
BASOS ABS: 0.1 10*3/uL (ref 0–0.1)
BASOS PCT: 1 %
EOS ABS: 0.1 10*3/uL (ref 0–0.7)
Eosinophils Relative: 2 %
HEMATOCRIT: 36.6 % — AB (ref 40.0–52.0)
HEMOGLOBIN: 11.9 g/dL — AB (ref 13.0–18.0)
Lymphocytes Relative: 16 %
Lymphs Abs: 0.7 10*3/uL — ABNORMAL LOW (ref 1.0–3.6)
MCH: 27 pg (ref 26.0–34.0)
MCHC: 32.6 g/dL (ref 32.0–36.0)
MCV: 82.6 fL (ref 80.0–100.0)
Monocytes Absolute: 0.2 10*3/uL (ref 0.2–1.0)
Monocytes Relative: 5 %
NEUTROS ABS: 3.2 10*3/uL (ref 1.4–6.5)
NEUTROS PCT: 76 %
Platelets: 242 10*3/uL (ref 150–440)
RBC: 4.43 MIL/uL (ref 4.40–5.90)
RDW: 18.3 % — ABNORMAL HIGH (ref 11.5–14.5)
WBC: 4.2 10*3/uL (ref 3.8–10.6)

## 2015-12-13 NOTE — Telephone Encounter (Signed)
Ronnie Johnson (wife) advised.  Went to see Dr. Jyl Heinz at [facility] today.  Had CT scan on Monday. Dr. Crisoforo Oxford agreed that he cannot operate.

## 2015-12-13 NOTE — Progress Notes (Signed)
Patient ID: Ronnie Johnson, male   DOB: 01-02-1941, 75 y.o.   MRN: [id number]  Subjective: 75 y.o.-year-old male returns the office today for painful, elongated, thickened toenails which he is unable to trim himself. Denies any redness or drainage around the nails. Denies any acute changes since last appointment and no new complaints today. Denies any systemic complaints such as fevers, chills, nausea, vomiting. He is being treated for pancreatic cancer and liver metastases. He is also on coumadin for DVT.   Objective: AAO 3, NAD DP/PT pulses palpable, CRT less than 3 seconds Protective sensation intact with Simms Weinstein monofilament Nails hypertrophic, dystrophic, elongated, brittle, discolored 10. There is tenderness overlying the nails 1-5 bilaterally. There is no surrounding erythema or drainage along the nail sites. No open lesions or pre-ulcerative lesions are identified. No other areas of tenderness bilateral lower extremities. No overlying edema, erythema, increased warmth. No pain with calf compression, swelling, warmth, erythema.  Assessment: Patient presents with symptomatic onychomycosis. 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  Plan: -Treatment options including alternatives, risks, complications were discussed -Nails sharply debrided 10 without complication/bleeding. -Discussed daily foot inspection. If there are any changes, to call the office immediately.  -Follow-up in 3 months or sooner if any problems are to arise. In the meantime, encouraged to call the office with any questions, concerns, changes symptoms.   Celesta Gentile, DPM

## 2015-12-14 NOTE — Telephone Encounter (Signed)
Duly noted.  Thanks for checking with them .

## 2015-12-17 NOTE — Progress Notes (Signed)
[facility] [address]  [phone] [phone]  ID: Ronnie Johnson OB: Sep 26, 1941  MR#: [id number]  EXH#:[id number]  Patient Care Team: Tonia Ghent, MD as PCP - General (Family Medicine) Hessie Knows, MD as Referring Physician (Orthopedic Surgery) Clent Jacks, RN as Registered Nurse  CHIEF COMPLAINT:  Chief Complaint  Patient presents with  . Pancreatic Cancer  . Chemotherapy    INTERVAL HISTORY: Patient returns to clinic today for further evaluation and initiation of cycle 1 of irinotecan and 5-FU. Patient continues to have increased weakness and fatigue, but otherwise feels well.  He has no neurologic complaints. He denies any easy bleeding or bruising. He denies any fevers. He denies any chest pain or shortness of breath. He denies nausea, vomiting, constipation, or diarrhea. He no longer complains of hematuria.  Patient offers no further specific complaints today.  REVIEW OF SYSTEMS:   Review of Systems  Constitutional: Positive for malaise/fatigue. Negative for fever and weight loss.  Eyes: Negative for pain, discharge and redness.  Cardiovascular: Negative for leg swelling.  Gastrointestinal: Negative for nausea and abdominal pain.  Genitourinary: Negative for hematuria.  Musculoskeletal: Negative for myalgias.  Neurological: Positive for weakness.  Endo/Heme/Allergies: Does not bruise/bleed easily.  Psychiatric/Behavioral: The patient has insomnia.     As per HPI. Otherwise, a complete review of systems is negatve.  PAST MEDICAL HISTORY: Past Medical History  Diagnosis Date  . Hypertension   . DVT of leg (deep venous thrombosis) ([place]) 2009  . Nephrolithiasis   . Hyperlipidemia   . Allergic rhinitis   . CAD (coronary artery disease)   . Urethral diverticulum   . Varicose vein of leg   . Dupuytren's contracture   . ED (erectile dysfunction)   . History of colonic polyps   . GERD (gastroesophageal reflux disease)   .  Diverticulosis of colon   . Myocardial infarction ([place])   . DVT (deep venous thrombosis) ([place])   . Polio 1948    was hospitalized for 1 year  . Diabetes mellitus, type 2 ([place])     Patient takes Metformin.  . SCC (squamous cell carcinoma)     scalp 2013 per derm  . Pancreatic cancer [facility])     PAST SURGICAL HISTORY: Past Surgical History  Procedure Laterality Date  . Appendectomy  1994  . Umbilical hernia repair  03/14/2005  . 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 Replacement total knee Left 2016  . Peripheral vascular catheterization N/A 06/23/2015    Procedure: [name];  Surgeon: Algernon Huxley, MD;  Location: [facility];  Service: Cardiovascular;  Laterality: N/A;    FAMILY HISTORY Family History  Problem Relation Age of Onset  . Diabetes Mother   . Hypertension Mother   . Stroke Mother   . Alzheimer's disease Mother   . Heart attack Father   . Hypertension Father   . Prostate cancer Brother   . Diabetes Brother   . Hypertension Brother   . Diabetes Sister   . Colon cancer Neg Hx   . Hypertension Daughter        ADVANCED DIRECTIVES:    HEALTH MAINTENANCE: Social History  Substance Use Topics  . Smoking status: Former Smoker    Quit date: 10/08/1988  . Smokeless tobacco: Never Used  . Alcohol Use: 0.0 oz/week    0 Standard drinks or equivalent per week     Comment: RARE     Colonoscopy:  PAP:  Bone density:  Lipid panel:  Allergies  Allergen Reactions  . Glimepiride Other (See Comments)    Leg stiffness  . Lipitor [Atorvastatin Calcium]     Tolerates crestor  . Oxycodone Other (See Comments)    Altered mental state  . Tramadol Other (See Comments)    Hiccups  . Latex Rash  . Tape Rash    Current Outpatient Prescriptions  Medication Sig Dispense Refill  . Blood Glucose Monitoring Suppl (ONE TOUCH ULTRA SYSTEM KIT) W/DEVICE KIT Check blood sugar once daily and as directed. Dx E11.65 1 each 0  . carvedilol (COREG)  6.25 MG tablet Take 1 tablet (6.25 mg total) by mouth 2 (two) times daily with a meal. 180 tablet 3  . esomeprazole (NEXIUM) 20 MG capsule Take 1 capsule (20 mg total) by mouth daily as needed (HEARTBURN). 90 capsule 3  . Insulin Glargine (LANTUS SOLOSTAR) 100 UNIT/ML Solostar Pen Inject 5 units once a day.  Can add 1 unit per day if AM sugar is >150. 5 pen PRN  . lidocaine-prilocaine (EMLA) cream Apply 1 application topically as needed. Apply to port 1 hour prior to chemotherapy appointment, cover with plastic wrap. 30 g 2  . lisinopril (PRINIVIL,ZESTRIL) 10 MG tablet Take 1 tablet (10 mg total) by mouth daily. 90 tablet 3  . loperamide (IMODIUM A-D) 2 MG tablet Take 1 tablet (2 mg total) by mouth 3 (three) times daily as needed. Take 2 at diarrhea onset , then 1 every 2hr until 12hrs with no BM. May take 2 every 4hrs at night. If diarrhea recurs repeat. 100 tablet 1  . metFORMIN (GLUCOPHAGE) 1000 MG tablet Take up to 102m in the AM and 5047min the PM.  If GI upset, cut back to 100074m day total. 135 tablet 3  . nitroGLYCERIN (NITROSTAT) 0.4 MG SL tablet Place under the tongue.    . prochlorperazine (COMPAZINE) 10 MG tablet Take 1 tablet (10 mg total) by mouth every 6 (six) hours as needed for nausea or vomiting. 30 tablet 2  . rosuvastatin (CRESTOR) 20 MG tablet Take 0.5 tablets (10 mg total) by mouth daily. 90 tablet 1  . sitaGLIPtin (JANUVIA) 100 MG tablet Take 1 tablet (100 mg total) by mouth daily. 90 tablet 3  . warfarin (COUMADIN) 5 MG tablet Take as directed by anti-coagulation clinic. 140 tablet 0  . zolpidem (AMBIEN) 10 MG tablet Take 1 tablet (10 mg total) by mouth at bedtime as needed for sleep. 30 tablet 0   No current facility-administered medications for this visit.   Facility-Administered Medications Ordered in Other Visits  Medication Dose Route Frequency Provider Last Rate Last Dose  . heparin lock flush 100 unit/mL  500 Units Intravenous Once TimLloyd HugerD      .  sodium chloride flush (NS) 0.9 % injection 10 mL  10 mL Intravenous PRN TimLloyd HugerD   10 mL at 12/06/15 1107    OBJECTIVE: Filed Vitals:   12/06/15 1031  BP: 123/72  Pulse: 73  Temp: 98.4 F (36.9 C)     Body mass index is 27.7 kg/(m^2).    ECOG FS:1 - Symptomatic but completely ambulatory  General: Well-developed, well-nourished, no acute distress. Eyes: Pink conjunctiva, anicteric sclera. Lungs: Clear to auscultation bilaterally. Heart: Regular rate and rhythm. No rubs, murmurs, or gallops. Abdomen: Soft, nontender, nondistended. No organomegaly noted, normoactive bowel sounds. Musculoskeletal: Minimal bilateral edema, mild erythema of bilateral ankles. Neuro: Alert, answering all questions appropriately. Cranial  nerves grossly intact. Skin: No rashes or petechiae noted. Psych: Normal affect.    LAB RESULTS:  Lab Results  Component Value Date   NA 135 12/13/2015   K 4.4 12/13/2015   CL 103 12/13/2015   CO2 27 12/13/2015   GLUCOSE 216* 12/13/2015   BUN 10 12/13/2015   CREATININE 0.68 12/13/2015   CALCIUM 8.9 12/13/2015   PROT 6.9 12/13/2015   ALBUMIN 4.0 12/13/2015   AST 12* 12/13/2015   ALT 9* 12/13/2015   ALKPHOS 47 12/13/2015   BILITOT 0.5 12/13/2015   GFRNONAA >60 12/13/2015   GFRAA >60 12/13/2015    Lab Results  Component Value Date   WBC 4.2 12/13/2015   NEUTROABS 3.2 12/13/2015   HGB 11.9* 12/13/2015   HCT 36.6* 12/13/2015   MCV 82.6 12/13/2015   PLT 242 12/13/2015   Lab Results  Component Value Date   CA199 882* 12/06/2015    STUDIES: Nm Pet Image Restag (ps) Skull Base To Thigh  11/18/2015  CLINICAL DATA:  Subsequent treatment strategy for restaging of pancreatic cancer. Malignant neoplasm of head of pancreas. EXAM: NUCLEAR MEDICINE PET SKULL BASE TO THIGH TECHNIQUE: 12.1 MCi F-18 FDG was injected intravenously. Full-ring PET imaging was performed from the skull base to thigh after the radiotracer. CT data was obtained and used for  attenuation correction and anatomic localization. FASTING BLOOD GLUCOSE:  Value: 123 mg/dl COMPARISON:  09/09/2015 PET. FINDINGS: NECK No areas of abnormal hypermetabolism. CHEST A right cardiophrenic angle node measures 9 mm and a S.U.V. max of 2.7 on image 116/series 3. Enlarged from 5 mm, and not hypermetabolic on the prior exam. ABDOMEN/PELVIS Previous described hepatic dome lesion is subtly apparent at 1.4 cm. Vague hyper metabolism in this area is new including at a S.U.V. max of 3.4 on image 114/series. A node within the gastrohepatic ligament measures 1.0 cm and a S.U.V. max of 4.8 on image 132/series 3. New. Peripancreatic node within the inferior aspect gastrohepatic ligament measures 1.2 cm and a S.U.V. max of 4.1 on image 136 of series 3. Similar in size and a S.U.V. max of 5.8 on the prior. Mass within the pancreatic neck/ body junction is ill-defined, measuring on the order of 3.5 x 3.4 cm and a S.U.V. max of 5.4 today. Minimally enlarged from 3.4 x 3.1 cm and a S.U.V. max of 7.2 on the prior. Centrally necrotic node versus second pancreatic lesion, along the pancreatic head and uncinate process. 3.6 x 2.7 cm and a S.U.V. max of 5.6 today. Compare 2.8 x 3.5 cm and a S.U.V. max of 5.3 on the prior. Subtle hypermetabolism along the anterior portion of the left lobe of the liver, new. This measures a S.U.V. max of 4.0, including on image 116/series 3. No well-defined parenchymal or peritoneal mass in this area. SKELETON No abnormal marrow activity. CT IMAGES PERFORMED FOR ATTENUATION CORRECTION Bilateral carotid atherosclerosis. Right Port-A-Cath which terminates at the low SVC. Mild cardiomegaly with dense coronary artery atherosclerosis. Trace right pleural fluid. Lower pole left renal collecting system calculus. Extensive colonic diverticulosis. IMPRESSION: 1. Mild progression of disease. 2. New hypermetabolism corresponding to hepatic dome lesion, consistent with progressive metastasis. A new right  cardiophrenic angle hypermetabolic node is also highly suspicious for disease progression. 3. Pancreatic primary and surrounding lymph nodes demonstrate primarily similar hypermetabolism. However, these have undergone interval mild enlargement. Further, a gastrohepatic ligament nodal metastasis is new. 4. Vague hypermetabolism along the anterior left hepatic capsule warrants followup attention to exclude CT occult peritoneal metastasis.  Electronically Signed   By: Abigail Miyamoto M.D.   On: 11/18/2015 10:22    ASSESSMENT: Stage IV adenocarcinoma the pancreas.  PLAN:    1. Pancreatic cancer: PET scan results from November 17, 2014 reviewed independently and reported with what appears to be progressive disease. Patient's CA-19-9 is increasing as well. After lengthy discussion with the patient, he wishes to continue with palliative salvage chemotherapy. Proceed with cycle 1 of irinotecan and 5-FU today. Patient is likely no longer a surgical candidate. Return to clinic in 2 days for pump removal and then in 2 weeks for consideration of cycle 2.    2. Thrombocytopenia: Resolved.  3. Anemia: Mild, monitor. 4. Hyperglycemia:  Continue diabetic medications as prescribed.  5. DVTs: Likely secondary to decreased activity and malignancy. Continue Coumadin. Patient's INR levels are monitored by his primary care. Goal INR 2.0-3.0. 6. Leg pain: Possibly secondary to deconditioning. Patient was offered physical therapy with the [facility], but he has refused. 7. Insomnia: Continue Ambien as needed. 8. Hematuria: Resolved. Continue follow-up with urology as needed.   Patient expressed understanding and was in agreement with this plan. He also understands that He can call clinic at any time with any questions, concerns, or complaints.   Lloyd Huger, MD   12/17/2015 6:23 AM

## 2015-12-19 ENCOUNTER — Ambulatory Visit (INDEPENDENT_AMBULATORY_CARE_PROVIDER_SITE_OTHER): Payer: PPO | Admitting: *Deleted

## 2015-12-19 ENCOUNTER — Encounter: Payer: Self-pay | Admitting: Cardiovascular Disease

## 2015-12-19 ENCOUNTER — Ambulatory Visit (INDEPENDENT_AMBULATORY_CARE_PROVIDER_SITE_OTHER): Payer: PPO | Admitting: Cardiovascular Disease

## 2015-12-19 VITALS — BP 104/52 | HR 79 | Ht 67.0 in | Wt 161.0 lb

## 2015-12-19 DIAGNOSIS — E785 Hyperlipidemia, unspecified: Secondary | ICD-10-CM | POA: Diagnosis not present

## 2015-12-19 DIAGNOSIS — I82403 Acute embolism and thrombosis of unspecified deep veins of lower extremity, bilateral: Secondary | ICD-10-CM

## 2015-12-19 DIAGNOSIS — I10 Essential (primary) hypertension: Secondary | ICD-10-CM

## 2015-12-19 DIAGNOSIS — R0789 Other chest pain: Secondary | ICD-10-CM

## 2015-12-19 DIAGNOSIS — I2510 Atherosclerotic heart disease of native coronary artery without angina pectoris: Secondary | ICD-10-CM

## 2015-12-19 DIAGNOSIS — Z5181 Encounter for therapeutic drug level monitoring: Secondary | ICD-10-CM

## 2015-12-19 LAB — POCT INR: INR: 2.2

## 2015-12-19 NOTE — Progress Notes (Signed)
Pre visit review using our clinic review tool, if applicable. No additional management support is needed unless otherwise documented below in the visit note. 

## 2015-12-19 NOTE — Patient Instructions (Signed)
You are doing well.  Please decrease the coreg/cardvedilol in 1/2 twice a day Stay on lisinopril 5 mg daily  Call the office if you have more dizziness when you stand up If symptoms are bad, we would hold the lisinopril  Please call us if you have new issues that need to be addressed before your next appt.  Your physician wants you to follow-up in: 2 months.

## 2015-12-19 NOTE — Progress Notes (Signed)
Patient ID: Ronnie Johnson, male    DOB: [birth year], 75 y.o.   MRN: [id number]  HPI Comments: 75 yo with history of CAD s/p MI in April 2010 and DVT right leg in January 2009, hypertension, hyperlipidemia, presents for followup of his coronary artery disease. He is undergoing treatment for pancreatic cancer with lesion in his liver Chronic leg swelling  He reports recent infusion 2 weeks ago at the cancer center, managed by Dr. Grayland Ormond Reports having nausea, vomiting, diarrhea for one day after treatment and then felt better He does have some mild lower extremity edema, some of which has been chronic  Weight continues to drop, today 161 pounds down from 169 pounds on his last clinic visit He was 196 pounds in 2016  Atypical type chest pain, sometimes happens on the left, sometimes on the right, no rhyme or reason, not associated with exertion Seems to go away with massage  Wonders whether he should do infusion tomorrow, was debating whether to have second opinion at [facility]  He does report having lightheadedness when he stands up at times  EKG on today's visit shows normal sinus rhythm with rate 79 bpm, no significant ST or T-wave changes  Other past medical history  total knee replacement on the left, working with PT 3 days per week, little other activity   MI  April 2010.   Myoview showing a large lateral defect from a base to apex, that was mainly fixed with some peri-infarct ischemia. EF was 42%.    left heart catheterization in May 2010, that showed EF of 55%, left ventricular end-diastolic pressure of 21 mmHg, and on coronary angiography, there were separate ostia to the LAD and the circumflex. There was a 30-40% ostial LAD stenosis and 40% first diagonal stenosis. The first obtuse marginal was totally occluded. There was a 75% stenosis in the AV circumflex after the first obtuse marginal. There was a 70% ostial stenosis in the second obtuse marginal. There was an 80%  stenosis in the circumflex after the second obtuse marginal (the circumflex was a small vessel at that point). It was decided to manage the patient medically.      Allergies  Allergen Reactions  . Glimepiride Other (See Comments)    Leg stiffness  . Lipitor [Atorvastatin Calcium]     Tolerates crestor  . Oxycodone Other (See Comments)    Altered mental state  . Tramadol Other (See Comments)    Hiccups  . Latex Rash  . Tape Rash    Outpatient Encounter Prescriptions as of 12/19/2015  Medication Sig  . Blood Glucose Monitoring Suppl (ONE TOUCH ULTRA SYSTEM KIT) W/DEVICE KIT Check blood sugar once daily and as directed. Dx E11.65  . carvedilol (COREG) 6.25 MG tablet Take 1 tablet (6.25 mg total) by mouth 2 (two) times daily with a meal.  . esomeprazole (NEXIUM) 20 MG capsule Take 1 capsule (20 mg total) by mouth daily as needed (HEARTBURN).  . Insulin Glargine (LANTUS SOLOSTAR) 100 UNIT/ML Solostar Pen Inject 5 units once a day.  Can add 1 unit per day if AM sugar is >150.  Marland Kitchen lidocaine-prilocaine (EMLA) cream Apply 1 application topically as needed. Apply to port 1 hour prior to chemotherapy appointment, cover with plastic wrap.  . lisinopril (PRINIVIL,ZESTRIL) 10 MG tablet Take 1 tablet (10 mg total) by mouth daily.  Marland Kitchen loperamide (IMODIUM A-D) 2 MG tablet Take 1 tablet (2 mg total) by mouth 3 (three) times daily as needed. Take 2 at  diarrhea onset , then 1 every 2hr until 12hrs with no BM. May take 2 every 4hrs at night. If diarrhea recurs repeat.  . metFORMIN (GLUCOPHAGE) 1000 MG tablet Take up to 1051m in the AM and 5036min the PM.  If GI upset, cut back to 100033m day total.  . nitroGLYCERIN (NITROSTAT) 0.4 MG SL tablet Place under the tongue.  . prochlorperazine (COMPAZINE) 10 MG tablet Take 1 tablet (10 mg total) by mouth every 6 (six) hours as needed for nausea or vomiting.  . rosuvastatin (CRESTOR) 20 MG tablet Take 0.5 tablets (10 mg total) by mouth daily.  . wMarland Kitchenrfarin  (COUMADIN) 5 MG tablet Take as directed by anti-coagulation clinic.  . zMarland Kitchenlpidem (AMBIEN) 10 MG tablet Take 1 tablet (10 mg total) by mouth at bedtime as needed for sleep.  . [DISCONTINUED] sitaGLIPtin (JANUVIA) 100 MG tablet Take 1 tablet (100 mg total) by mouth daily. (Patient not taking: Reported on 12/19/2015)   Facility-Administered Encounter Medications as of 12/19/2015  Medication  . heparin lock flush 100 unit/mL  . sodium chloride flush (NS) 0.9 % injection 10 mL    Past Medical History  Diagnosis Date  . Hypertension   . D[place] of [place] ([place]  . Nephrolithiasis   . Hyperlipidemia   . Allergic rhinitis   . CAD (coronary artery disease)   . Urethral diverticulum   . Varicose vein of leg   . Dupuytren's contracture   . ED (erectile dysfunction)   . History of colonic polyps   . GERD (gastroesophageal reflux disease)   . Diverticulosis of colon   . [place] D[place] ([place]) [place] . Polio 1948    was hospitalized for 1 year  . Diabetes mellitus, type 2 ([place]   Patient takes Metformin.  . SCC (squamous cell carcinoma)     scalp 2013 per derm  [facility]   Past Surgical History  Procedure Laterality Date  . Appendectomy  1994  . Umbilical hernia repair  03/14/2005  . 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 Replacement total knee Left 2016  . Peripheral vascular catheterization N/A 06/23/2015    Procedure: PorGlori Luisth Insertion;  Surgeon: JasAlgernon HuxleyD;  [facility];  Service: Cardiovascular;  Laterality: N/A;    Social History  reports that he quit smoking about 27 years ago. He has never used smokeless tobacco. He reports that he drinks alcohol. He reports that he does not use illicit drugs.  Family History family history includes Alzheimer's disease in his mother; Diabetes in his brother, mother, and sister; Heart attack in his father; Hypertension in his  brother, daughter, father, and mother; Prostate cancer in his brother; Stroke in his mother. There is no history of Colon cancer.   Review of Systems  Constitutional: Negative.   Respiratory: Negative.   Cardiovascular: Positive for leg swelling.  Gastrointestinal: Negative.   Musculoskeletal: Positive for arthralgias.  Neurological: Negative.   Hematological: Negative.   Psychiatric/Behavioral: Negative.   All other systems reviewed and are negative.   BP 104/52 mmHg  Pulse 79  Ht 5' 7"  (1.702 m)  Wt 161 lb (73.029 kg)  BMI 25.21 kg/m2  Physical Exam  Constitutional: He is oriented to person, place, and time. He appears well-developed and well-nourished.  obese  HENT:  Head: Normocephalic.  Nose: Nose normal.  Mouth/Throat: Oropharynx  is clear and moist.  Eyes: Conjunctivae are normal. Pupils are equal, round, and reactive to light.  Neck: Normal range of motion. Neck supple. No JVD present.  Cardiovascular: Normal rate, regular rhythm, S1 normal, S2 normal, normal heart sounds and intact distal pulses.  Exam reveals no gallop and no friction rub.   No murmur heard. Trace nonpitting lower extremity brawny edema bilaterally  Pulmonary/Chest: Effort normal and breath sounds normal. No respiratory distress. He has no wheezes. He has no rales. He exhibits no tenderness.  Abdominal: Soft. Bowel sounds are normal. He exhibits no distension. There is no tenderness.  Musculoskeletal: Normal range of motion. He exhibits no edema or tenderness.  Lymphadenopathy:    He has no cervical adenopathy.  Neurological: He is alert and oriented to person, place, and time. Coordination normal.  Skin: Skin is warm and dry. No rash noted. No erythema.  Psychiatric: He has a normal mood and affect. His behavior is normal. Judgment and thought content normal.      Assessment and Plan   Nursing note and vitals reviewed.

## 2015-12-19 NOTE — Assessment & Plan Note (Signed)
Blood pressure is running low, rechecked by myself systolic [id number] Likely secondary to weight loss Recommended he decrease carvedilol down to 3.125 mg twice a day He is also taking lisinopril 5 mg daily If he continues to have lightheadedness with standing consistent with orthostasis, recommended he call the office. At that time we would hold lisinopril

## 2015-12-19 NOTE — Assessment & Plan Note (Signed)
Long discussion concerning his treatments Recommended he continue with the infusion schedule rather than wait for appointment at [facility]. Recommended he discuss his side effects from the infusion with his team at [facility] which included nausea, vomiting, diarrhea for 1 day. Explained to him that this is likely a normal response but perhaps symptoms might improve with nausea medication   Total encounter time more than 25 minutes  Greater than 50% was spent in counseling and coordination of care with the patient

## 2015-12-19 NOTE — Assessment & Plan Note (Signed)
He reports recent atypical type chest pain symptoms Does not seem to be consistent with his previous angina No workup at this time but recommended he call us if symptoms get worse Currently symptoms resolve with massage

## 2015-12-19 NOTE — Assessment & Plan Note (Signed)
Recent cholesterol was very low, it appears he is does taking half  dose Crestor Drop in numbers likely secondary to weight loss

## 2015-12-19 NOTE — Assessment & Plan Note (Signed)
Currently with no symptoms of angina. No further workup at this time. Continue current medication regimen. 

## 2015-12-20 ENCOUNTER — Inpatient Hospital Stay: Payer: PPO

## 2015-12-20 ENCOUNTER — Inpatient Hospital Stay (HOSPITAL_BASED_OUTPATIENT_CLINIC_OR_DEPARTMENT_OTHER): Payer: PPO | Admitting: Oncology

## 2015-12-20 VITALS — BP 120/72 | HR 91 | Temp 98.0°F | Resp 16 | Wt 160.5 lb

## 2015-12-20 DIAGNOSIS — Z794 Long term (current) use of insulin: Secondary | ICD-10-CM | POA: Diagnosis not present

## 2015-12-20 DIAGNOSIS — E1165 Type 2 diabetes mellitus with hyperglycemia: Secondary | ICD-10-CM | POA: Diagnosis not present

## 2015-12-20 DIAGNOSIS — R531 Weakness: Secondary | ICD-10-CM

## 2015-12-20 DIAGNOSIS — C250 Malignant neoplasm of head of pancreas: Secondary | ICD-10-CM

## 2015-12-20 DIAGNOSIS — R5381 Other malaise: Secondary | ICD-10-CM

## 2015-12-20 DIAGNOSIS — Z7984 Long term (current) use of oral hypoglycemic drugs: Secondary | ICD-10-CM

## 2015-12-20 DIAGNOSIS — C259 Malignant neoplasm of pancreas, unspecified: Secondary | ICD-10-CM

## 2015-12-20 DIAGNOSIS — G4700 Insomnia, unspecified: Secondary | ICD-10-CM

## 2015-12-20 DIAGNOSIS — R630 Anorexia: Secondary | ICD-10-CM

## 2015-12-20 DIAGNOSIS — R110 Nausea: Secondary | ICD-10-CM

## 2015-12-20 DIAGNOSIS — F419 Anxiety disorder, unspecified: Secondary | ICD-10-CM

## 2015-12-20 DIAGNOSIS — D649 Anemia, unspecified: Secondary | ICD-10-CM

## 2015-12-20 DIAGNOSIS — R5383 Other fatigue: Secondary | ICD-10-CM

## 2015-12-20 DIAGNOSIS — C787 Secondary malignant neoplasm of liver and intrahepatic bile duct: Principal | ICD-10-CM

## 2015-12-20 MED ORDER — HYDROCODONE-ACETAMINOPHEN 5-325 MG PO TABS: 1.0000 | ORAL_TABLET | Freq: Four times a day (QID) | ORAL | Status: DC | PRN

## 2015-12-20 MED ORDER — SODIUM CHLORIDE 0.9% FLUSH
10.0000 mL | Freq: Once | INTRAVENOUS | Status: AC
  Filled 2015-12-20: qty 10

## 2015-12-20 MED ORDER — HEPARIN SOD (PORK) LOCK FLUSH 100 UNIT/ML IV SOLN: 500.0000 [IU] | Freq: Once | INTRAVENOUS | Status: AC

## 2015-12-20 NOTE — Progress Notes (Signed)
During last treatment patient was feeling nausea and since then he still has the nausea and diarrhea with yesterday being the worse with diarrhea.  He has not taken Imodium at home and only takes the Compazine on occasion.  They have chosen not to get any labs drawn today because he had them drawn 1 week.

## 2015-12-23 ENCOUNTER — Telehealth: Payer: Self-pay | Admitting: Oncology

## 2015-12-23 NOTE — Telephone Encounter (Signed)
He went to [facility] and the doctor there is going to try some new tx on him that involves his DNA. He said he wants to cancel all upcoming treatments with [place] for now until after he's given that a try. Thanks.

## 2015-12-23 NOTE — Telephone Encounter (Signed)
Ok, thank you

## 2015-12-26 NOTE — Progress Notes (Signed)
[place]  [phone] [phone]  ID: Camp Gopal OB: Jan 11, 1941  MR#: [id number]  AOZ#:[id number]  Patient Care Team: Tonia Ghent, MD as PCP - General (Family Medicine) Hessie Knows, MD as Referring Physician (Orthopedic Surgery) Clent Jacks, RN as Registered Nurse  CHIEF COMPLAINT:  Chief Complaint  Patient presents with  . Pancreatic Cancer    INTERVAL HISTORY: Patient returns to clinic today for further evaluation and consideration of cycle 2 of irinotecan and 5-FU. Patient continues to have increased weakness and fatigue. He is persistently nauseous and has a poor appetite. He has no neurologic complaints. He denies any easy bleeding or bruising. He denies any fevers. He denies any chest pain or shortness of breath. He denies vomiting, constipation, or diarrhea. He no longer complains of hematuria.  Patient  Feels generally terrible, but offers no further specific complaints today.  REVIEW OF SYSTEMS:   Review of Systems  Constitutional: Positive for malaise/fatigue. Negative for fever and weight loss.  Eyes: Negative for pain, discharge and redness.  Cardiovascular: Negative for leg swelling.  Gastrointestinal: Positive for nausea. Negative for abdominal pain, blood in stool and melena.  Genitourinary: Negative for hematuria.  Musculoskeletal: Negative for myalgias.  Neurological: Positive for weakness.  Endo/Heme/Allergies: Does not bruise/bleed easily.  Psychiatric/Behavioral: The patient is nervous/anxious and has insomnia.     As per HPI. Otherwise, a complete review of systems is negatve.  PAST MEDICAL HISTORY: Past Medical History  Diagnosis Date  . Hypertension   . DVT of leg (deep venous thrombosis) ([place]) 2009  . Nephrolithiasis   . Hyperlipidemia   . Allergic rhinitis   . CAD (coronary artery disease)   . Urethral diverticulum   . Varicose vein of leg   . Dupuytren's contracture   . ED (erectile  dysfunction)   . History of colonic polyps   . GERD (gastroesophageal reflux disease)   . Diverticulosis of colon   . Myocardial infarction ([place])   . DVT (deep venous thrombosis) ([place])   . Polio 1948    was hospitalized for 1 year  . Diabetes mellitus, type 2 ([place])     Patient takes Metformin.  . SCC (squamous cell carcinoma)     scalp 2013 per derm  . Pancreatic cancer [facility])     PAST SURGICAL HISTORY: Past Surgical History  Procedure Laterality Date  . Appendectomy  1994  . Umbilical hernia repair  03/14/2005  . 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 Replacement total knee Left 2016  . Peripheral vascular catheterization N/A 06/23/2015    Procedure: [name];  Surgeon: Algernon Huxley, MD;  Location: [facility];  Service: Cardiovascular;  Laterality: N/A;    FAMILY HISTORY Family History  Problem Relation Age of Onset  . Diabetes Mother   . Hypertension Mother   . Stroke Mother   . Alzheimer's disease Mother   . Heart attack Father   . Hypertension Father   . Prostate cancer Brother   . Diabetes Brother   . Hypertension Brother   . Diabetes Sister   . Colon cancer Neg Hx   . Hypertension Daughter        ADVANCED DIRECTIVES:    HEALTH MAINTENANCE: Social History  Substance Use Topics  . Smoking status: Former Smoker    Quit date: 10/08/1988  . Smokeless tobacco: Never Used  . Alcohol Use: 0.0 oz/week    0 Standard drinks or equivalent  per week     Comment: RARE     Colonoscopy:  PAP:  Bone density:  Lipid panel:  Allergies  Allergen Reactions  . Glimepiride Other (See Comments)    Leg stiffness  . Lipitor [Atorvastatin Calcium]     Tolerates crestor  . Oxycodone Other (See Comments)    Altered mental state  . Tramadol Other (See Comments)    Hiccups  . Latex Rash  . Tape Rash    Current Outpatient Prescriptions  Medication Sig Dispense Refill  . Blood Glucose Monitoring Suppl (ONE TOUCH ULTRA SYSTEM KIT)  W/DEVICE KIT Check blood sugar once daily and as directed. Dx E11.65 1 each 0  . carvedilol (COREG) 6.25 MG tablet Take 1 tablet (6.25 mg total) by mouth 2 (two) times daily with a meal. (Patient taking differently: Take 6.25 mg by mouth 2 (two) times daily with a meal. 1/2 tab bid) 180 tablet 3  . esomeprazole (NEXIUM) 20 MG capsule Take 1 capsule (20 mg total) by mouth daily as needed (HEARTBURN). 90 capsule 3  . Insulin Glargine (LANTUS SOLOSTAR) 100 UNIT/ML Solostar Pen Inject 5 units once a day.  Can add 1 unit per day if AM sugar is >150. 5 pen PRN  . lidocaine-prilocaine (EMLA) cream Apply 1 application topically as needed. Apply to port 1 hour prior to chemotherapy appointment, cover with plastic wrap. 30 g 2  . lisinopril (PRINIVIL,ZESTRIL) 10 MG tablet Take 1 tablet (10 mg total) by mouth daily. 90 tablet 3  . loperamide (IMODIUM A-D) 2 MG tablet Take 1 tablet (2 mg total) by mouth 3 (three) times daily as needed. Take 2 at diarrhea onset , then 1 every 2hr until 12hrs with no BM. May take 2 every 4hrs at night. If diarrhea recurs repeat. 100 tablet 1  . metFORMIN (GLUCOPHAGE) 1000 MG tablet Take up to 109m in the AM and 5048min the PM.  If GI upset, cut back to 100035m day total. 135 tablet 3  . nitroGLYCERIN (NITROSTAT) 0.4 MG SL tablet Place under the tongue.    . prochlorperazine (COMPAZINE) 10 MG tablet Take 1 tablet (10 mg total) by mouth every 6 (six) hours as needed for nausea or vomiting. 30 tablet 2  . rosuvastatin (CRESTOR) 20 MG tablet Take 0.5 tablets (10 mg total) by mouth daily. 90 tablet 1  . warfarin (COUMADIN) 5 MG tablet Take as directed by anti-coagulation clinic. 140 tablet 0  . HYDROcodone-acetaminophen (NORCO) 5-325 MG tablet Take 1-2 tablets by mouth every 6 (six) hours as needed for moderate pain. 60 tablet 0  . zolpidem (AMBIEN) 10 MG tablet Take 1 tablet (10 mg total) by mouth at bedtime as needed for sleep. 30 tablet 0   No current facility-administered  medications for this visit.   Facility-Administered Medications Ordered in Other Visits  Medication Dose Route Frequency Provider Last Rate Last Dose  . heparin lock flush 100 unit/mL  500 Units Intravenous Once TimLloyd HugerD      . heparin lock flush 100 unit/mL  500 Units Intravenous Once TimLloyd HugerD      . sodium chloride flush (NS) 0.9 % injection 10 mL  10 mL Intravenous PRN TimLloyd HugerD   10 mL at 12/06/15 1107  . sodium chloride flush (NS) 0.9 % injection 10 mL  10 mL Intravenous Once TimLloyd HugerD        OBJECTIVE: Filed Vitals:   12/20/15 0912  BP: 120/72  Pulse: 91  Temp: 98 F (36.7 C)  Resp: 16     Body mass index is 25.13 kg/(m^2).    ECOG FS:2 - Symptomatic, <50% confined to bed  General: Well-developed, well-nourished, no acute distress. Eyes: Pink conjunctiva, anicteric sclera. Lungs: Clear to auscultation bilaterally. Heart: Regular rate and rhythm. No rubs, murmurs, or gallops. Abdomen: Soft, nontender, nondistended. No organomegaly noted, normoactive bowel sounds. Musculoskeletal: Minimal bilateral edema, mild erythema of bilateral ankles. Neuro: Alert, answering all questions appropriately. Cranial nerves grossly intact. Skin: No rashes or petechiae noted. Psych: Normal affect.    LAB RESULTS:  Lab Results  Component Value Date   NA 135 12/13/2015   K 4.4 12/13/2015   CL 103 12/13/2015   CO2 27 12/13/2015   GLUCOSE 216* 12/13/2015   BUN 10 12/13/2015   CREATININE 0.68 12/13/2015   CALCIUM 8.9 12/13/2015   PROT 6.9 12/13/2015   ALBUMIN 4.0 12/13/2015   AST 12* 12/13/2015   ALT 9* 12/13/2015   ALKPHOS 47 12/13/2015   BILITOT 0.5 12/13/2015   GFRNONAA >60 12/13/2015   GFRAA >60 12/13/2015    Lab Results  Component Value Date   WBC 4.2 12/13/2015   NEUTROABS 3.2 12/13/2015   HGB 11.9* 12/13/2015   HCT 36.6* 12/13/2015   MCV 82.6 12/13/2015   PLT 242 12/13/2015   Lab Results  Component Value Date    CA199 882* 12/06/2015    STUDIES: No results found.  ASSESSMENT:  Progressive stage IV adenocarcinoma the pancreas.  PLAN:    1. Pancreatic cancer: PET scan results from November 17, 2014 reviewed independently and reported with what appears to be progressive disease. Patient's CA-19-9 is increasing as well.  Patient does not wish to have any chemotherapy today, it is unclear if he wishes to proceed with any further treatments. Hospice and end-of-life care were discussed today, but the patient is not ready to make this decision. He has agreed to return to clinic in 1 week for reconsideration of cycle 2 of irinotecan and 5-FU. Patient is no longer a surgical candidate.  2. Thrombocytopenia: Resolved.  3. Anemia: Mild, monitor. 4. Hyperglycemia:  Continue diabetic medications as prescribed.  5. DVTs: Likely secondary to decreased activity and malignancy. Continue Coumadin. Patient's INR levels are monitored by his primary care. Goal INR 2.0-3.0. 6. Leg pain: Possibly secondary to deconditioning. Patient was offered physical therapy with the [facility] program, but he has refused. 7. Insomnia: Continue Ambien as needed. 8. Hematuria: Resolved. Continue follow-up with urology as needed.    Approximate 30 minutes was spent in discussion of which greater than 50% was consultation.  Patient expressed understanding and was in agreement with this plan. He also understands that He can call clinic at any time with any questions, concerns, or complaints.   Lloyd Huger, MD   12/26/2015 11:25 PM

## 2015-12-27 ENCOUNTER — Inpatient Hospital Stay: Payer: PPO | Admitting: Oncology

## 2015-12-27 ENCOUNTER — Inpatient Hospital Stay: Payer: PPO

## 2015-12-28 NOTE — Telephone Encounter (Signed)
Erroneous

## 2016-01-11 ENCOUNTER — Ambulatory Visit: Payer: PPO | Admitting: Cardiovascular Disease

## 2016-01-15 ENCOUNTER — Other Ambulatory Visit: Payer: Self-pay | Admitting: Family Medicine

## 2016-01-15 NOTE — Progress Notes (Signed)
Patient was at [facility].  Will need f/u coumadin check and labs.  Let me know if you can't see the info under care everywhere.  I cut and pasted scheduled meds below.  I didn't update med list yet in our EMR.  Orders are in for bmet and mag.  Thanks.   . carvedilol 3.125 mg Oral BID  . dronabinol 2.5 mg Oral TID WC  . heparin lock flush (porcine) 50 Units Intracatheter Daily@0600   . insulin lispro 1-8 Units Subcutaneous 4x Daily WC and nightly  . lisinopril 5 mg Oral Daily@0900   . pantoprazole 40 mg Oral Daily@0900   . sodium chloride 10 mL Intracatheter 3 times per day  . warfarin 5 mg Oral Once per day on Mon Fri  . warfarin 7.5 mg Oral Once per day on Sun Tue Wed Thu Sat   Addendum- his labs were done out of clinic.  bmet and mag cancelled here.

## 2016-01-16 ENCOUNTER — Ambulatory Visit (INDEPENDENT_AMBULATORY_CARE_PROVIDER_SITE_OTHER): Payer: PPO | Admitting: *Deleted

## 2016-01-16 DIAGNOSIS — I82403 Acute embolism and thrombosis of unspecified deep veins of lower extremity, bilateral: Secondary | ICD-10-CM | POA: Diagnosis not present

## 2016-01-16 DIAGNOSIS — Z5181 Encounter for therapeutic drug level monitoring: Secondary | ICD-10-CM | POA: Diagnosis not present

## 2016-01-16 LAB — POCT INR: INR: 1.4

## 2016-01-16 NOTE — Progress Notes (Signed)
Patient was admitted 4/6 - 4/8 for electrolyte imbalance.  Coumadin was held after discharge per hospital instructions.  INR is sub therapeutic today.  Will restart Coumadin today with one time boost, then continue as previously scheduled.  Patient advised to avoid greens for the next 2-3 days.  Will recheck in 2 weeks and make additional adjustments at that time if needed.

## 2016-01-16 NOTE — Progress Notes (Signed)
Pre visit review using our clinic review tool, if applicable. No additional management support is needed unless otherwise documented below in the visit note. 

## 2016-01-19 ENCOUNTER — Other Ambulatory Visit: Payer: Self-pay | Admitting: Family Medicine

## 2016-01-19 MED ORDER — DRONABINOL 2.5 MG PO CAPS: 2.5000 mg | ORAL_CAPSULE | Freq: Three times a day (TID) | ORAL | Status: DC

## 2016-01-19 MED ORDER — CARVEDILOL 6.25 MG PO TABS: 3.1250 mg | ORAL_TABLET | Freq: Two times a day (BID) | ORAL | Status: DC

## 2016-01-19 NOTE — Progress Notes (Unsigned)
He was recently discharged from hospital, with records showing coreg 3.125 mg by mouth 2 times daily (in care everywhere). I updated the sig and sent the rx.  Thanks.

## 2016-01-26 ENCOUNTER — Ambulatory Visit (INDEPENDENT_AMBULATORY_CARE_PROVIDER_SITE_OTHER): Payer: PPO | Admitting: *Deleted

## 2016-01-26 DIAGNOSIS — Z5181 Encounter for therapeutic drug level monitoring: Secondary | ICD-10-CM

## 2016-01-26 DIAGNOSIS — I82403 Acute embolism and thrombosis of unspecified deep veins of lower extremity, bilateral: Secondary | ICD-10-CM

## 2016-01-26 LAB — POCT INR: INR: 1.6

## 2016-01-26 NOTE — Progress Notes (Signed)
INR remains sub therapeutic today despite recent increase in dose two weeks ago.  Patient reports he missed a dose two days ago.  No new medications or diet changes.  Patient instructed to take 2 tablets today then to continue as instructed at last check.  I will hold off on increasing dose today as I feel his low INR is related to the recently missed dose.  Will recheck in 2 weeks and make additional adjustments at that time if needed.

## 2016-01-26 NOTE — Progress Notes (Signed)
Pre visit review using our clinic review tool, if applicable. No additional management support is needed unless otherwise documented below in the visit note. 

## 2016-01-31 ENCOUNTER — Telehealth: Payer: Self-pay | Admitting: Family Medicine

## 2016-01-31 ENCOUNTER — Other Ambulatory Visit: Payer: Self-pay

## 2016-01-31 NOTE — Telephone Encounter (Signed)
[place] Patient Name: Ronnie Johnson DOB: 10-Apr-1941 Initial Comment Caller states her husband is in chemo- She is wondering if she can get his Potassium checked and Sodium. -- does not want to go to the ER. -- Nurse Assessment Nurse: Dimas Chyle, RN, Dellis Filbert Date/Time Eilene Ghazi Time): 01/31/2016 9:44:05 AM Confirm and document reason for call. If symptomatic, describe symptoms. You must click the next button to save text entered. ---Caller states her husband is in chemo- She is wondering if she can get his potassium checked and sodium. -- does not want to go to the ER. Pancreatic cancer. Last chemo was 2 weeks ago. Has the patient traveled out of the country within the last 30 days? ---No Does the patient have any new or worsening symptoms? ---Yes Will a triage be completed? ---Yes Related visit to physician within the last 2 weeks? ---Yes Does the PT have any chronic conditions? (i.e. diabetes, asthma, etc.) ---Yes List chronic conditions. ---Pancreatic cancer, diabetes type 2 Is this a behavioral health or substance abuse call? ---No Guidelines Guideline Title Affirmed Question Affirmed Notes Confusion - Delirium [1] Acting confused (e.g., disoriented, slurred speech) AND [2] brief (now gone) Final Disposition User See Physician within 4 Hours (or PCP triage) Dimas Chyle, RN, Dellis Filbert Referrals REFERRED TO PCP OFFICE REFERRED TO PCP OFFICE Disagree/Comply: Comply

## 2016-01-31 NOTE — Telephone Encounter (Signed)
Per report, patient had labs done at [place].  I think this was likely done via [facility] MDs or another outside clinic.  Please see what details you can get- sx, etc.  He shouldn't be on the schedule o/w.  Thanks.

## 2016-01-31 NOTE — Telephone Encounter (Signed)
Wife says she understands that she will be directed from [facility] but if he needs fluids, she wants him to get them from a local hospital rather than have to go to [facility].  Wife just wants Dr. Damita Dunnings to see the lab results and would like for someone here to see him this afternoon after the results come in.  Patient is having some hallucinations like he did when his Potassium was low previously.

## 2016-01-31 NOTE — Telephone Encounter (Signed)
I'll be glad to see the labs when they come in.  More importantly, if he is hallucinating the that needs to be addressed.  I presume they have notified [facility] MDs about that.  Did they offer other specific guidance? We don't have IVF her for the patient.

## 2016-01-31 NOTE — Telephone Encounter (Signed)
No lab results were ever received from [place].

## 2016-01-31 NOTE — Telephone Encounter (Signed)
Pt has lab appt scheduled at [facility] for 01/31/16 at 2:30 for K and Na.

## 2016-02-01 NOTE — Telephone Encounter (Signed)
Spoke to [place] (in the lab) and she is going to contact [facility] and get a copy of the lab work.  Patient's wife notified as instructed by telephone. Mrs. Poston stated that she has everything under control and that his lab work came back fine. Mrs. Longwith stated that patient is scheduled to have chemo tomorrow and she will talk with the doctor at that time.

## 2016-02-01 NOTE — Telephone Encounter (Signed)
Copy of lab work from [employer] [facility] is on Engineer, drilling.

## 2016-02-01 NOTE — Telephone Encounter (Signed)
Noted. Thanks.

## 2016-02-02 ENCOUNTER — Other Ambulatory Visit: Payer: Self-pay | Admitting: Family Medicine

## 2016-02-03 NOTE — Telephone Encounter (Signed)
Ronnie Johnson request status of test strip refill; advised pt done on 02/02/16; she will ck with pharmacy.

## 2016-02-08 ENCOUNTER — Ambulatory Visit: Payer: PPO

## 2016-02-09 ENCOUNTER — Ambulatory Visit: Payer: PPO

## 2016-02-09 ENCOUNTER — Ambulatory Visit (INDEPENDENT_AMBULATORY_CARE_PROVIDER_SITE_OTHER): Payer: PPO | Admitting: *Deleted

## 2016-02-09 DIAGNOSIS — I82403 Acute embolism and thrombosis of unspecified deep veins of lower extremity, bilateral: Secondary | ICD-10-CM

## 2016-02-09 DIAGNOSIS — Z5181 Encounter for therapeutic drug level monitoring: Secondary | ICD-10-CM

## 2016-02-09 LAB — POCT INR: INR: 2.6

## 2016-02-09 NOTE — Progress Notes (Signed)
Pre visit review using our clinic review tool, if applicable. No additional management support is needed unless otherwise documented below in the visit note. 

## 2016-02-10 ENCOUNTER — Other Ambulatory Visit: Payer: Self-pay | Admitting: *Deleted

## 2016-02-10 DIAGNOSIS — E119 Type 2 diabetes mellitus without complications: Secondary | ICD-10-CM

## 2016-02-10 MED ORDER — WARFARIN SODIUM 5 MG PO TABS: ORAL_TABLET | ORAL | Status: DC

## 2016-02-10 NOTE — Telephone Encounter (Signed)
Patient is out of town and did not bring enough meds with him.  Wife is requesting 30 day supply of warfarin and [facility] [place] [place].  Warfarin sent.  I do not see metformin on his med list.  It looks like it was d/c'ed on 01/19/16.  Patient stated he is still taking 500 mg bid.  Okay to refill?

## 2016-02-12 MED ORDER — METFORMIN HCL 500 MG PO TABS: 500.0000 mg | ORAL_TABLET | Freq: Two times a day (BID) | ORAL | Status: DC

## 2016-02-12 NOTE — Telephone Encounter (Signed)
I didn't get to this before close on Friday.  Even if I had gotten to this, the plan over the weekend wouldn't have changed.  Given his weight loss, I would stay off for now.  If his sugar is up >150 in the AM then I would restart with 1 pill a day, moving back to 2 pills a day if needed.   rx sent, for them to hold.  Thanks.

## 2016-02-13 ENCOUNTER — Other Ambulatory Visit: Payer: Self-pay | Admitting: *Deleted

## 2016-02-13 NOTE — Telephone Encounter (Signed)
EMR corrected.  Wife says he has been taking the Metformin as described below but is still getting 250's to 300's readings in the am.

## 2016-02-13 NOTE — Patient Outreach (Signed)
[facility] Care Management  02/13/2016  Hisao Seidensticker [facility] 07/25/1941 [id number]  Subjective: Telephone call to patient's home number, no answer, left HIPAA compliant voice mail message, and requested call back.   Objective: Per chart review: Patient has a history of pancreatic cancer, hypertension, and diabetes.  Assessment: Received [facility] referra on 02/10/16.   Referral source: [name].   Referral reason: Disease and symptom management.  [facility] diagnosed post chemo nausea and vomiting, failure to thrive, and  length of stay 2 days. Comorbidity: Cancer and diabetes with no complications.  Patient with recent hospitalization and discharges on 01/14/16.   1 ED visit in 6 months.    Services requested: [place].    Telephone screen completion, pending patient contact.   Plan: RNCM will call patient within 1 week for 2nd outreach attempt,  telephone screen completion, if no return call from patient.   Nomar Broad H. Annia Friendly, BSN, [place] Management [facility] Telephonic CM Phone: [phone] Fax: [phone]

## 2016-02-13 NOTE — Telephone Encounter (Signed)
Given his sugar, I would restart med as he has been taking, either 500mg  bid or 1000mg  am and 500mg  pm.  Please update emr as needed re: med sig.  rx was prev sent for 500mg  bid, they should be able to use those pills in the meantime.  Thanks.

## 2016-02-13 NOTE — Telephone Encounter (Signed)
I realize that his AM sugars are up.  If his sugars later in the day are lower/reasonable, then I wouldn't add anything on.  I don't want to make him go too low later in the day, especially since his last A1c was reasonable.  Please have them check a few sugars later in the day.  If still high then, I would add on 4th pill of metformin a day.  I put in A1c to recheck with next INR.  Thanks.

## 2016-02-13 NOTE — Telephone Encounter (Signed)
Wife says patient never DC'd Metformin.  Wife believes he is taking Metformin 1000 mg in the morning and 500 mg in the evening.  Wife was not able to verify at that time because she was not present with the medications but will call back to verify.  Wife states his BS was 240 this morning.

## 2016-02-14 ENCOUNTER — Other Ambulatory Visit: Payer: Self-pay | Admitting: *Deleted

## 2016-02-14 NOTE — Patient Outreach (Signed)
[place] [facility]) Care Management  02/14/2016  Lazlo Fluharty [facility] 10/11/1940 [id number]   Subjective: Telephone call to patient's home number, no answer, left HIPAA compliant voice mail message, and requested call back.   Objective: Per chart review: Patient has a history of pancreatic cancer, hypertension, and diabetes.  Assessment: Received [facility] referra on 02/10/16. Referral source: [name]. Referral reason: Disease and symptom management. [facility] diagnosed post chemo nausea and vomiting, failure to thrive, and length of stay 2 days. Comorbidity: Cancer and diabetes with no complications. Patient with recent hospitalization and discharges on 01/14/16. 1 ED visit in 6 months. Services requested: [place]. Telephone screen completion, pending patient contact.   Plan: RNCM will call patient within 1 week for 3rd outreach attempt, telephone screen completion, if no return call from patient.   Hussein Macdougal H. Annia Friendly, BSN, [place] Management [facility] Telephonic CM Phone: [phone] Fax: [phone]

## 2016-02-14 NOTE — Telephone Encounter (Signed)
Left detailed message on wife's voicemail

## 2016-02-15 ENCOUNTER — Other Ambulatory Visit: Payer: Self-pay | Admitting: *Deleted

## 2016-02-15 ENCOUNTER — Encounter: Payer: Self-pay | Admitting: *Deleted

## 2016-02-15 NOTE — Patient Outreach (Signed)
[facility]) Care Management  02/15/2016  Ronnie Johnson [facility] [birth year] [id number]   Subjective: Telephone call to patient's home number, no answer, left HIPAA compliant voice mail message, and requested call back.   Objective: Per chart review: Patient has a history of pancreatic cancer, hypertension, and diabetes.  Assessment: Received [facility] referra on 02/10/16. Referral source: [name]. Referral reason: Disease and symptom management. [facility] diagnosed post chemo nausea and vomiting, failure to thrive, and length of stay 2 days. Comorbidity: Cancer and diabetes with no complications. Patient with recent hospitalization and discharges on 01/14/16. 1 ED visit in 6 months. Services requested: [place]. Telephone screen completion, pending patient contact.   Plan: RNCM will send patient unsuccessful outreach letter and [facility] pamphlet, if no return call within 10 business days, will proceed with case closure.   Keyara Ent H. Annia Friendly, BSN, [place] Management [facility] Telephonic CM Phone: [phone] Fax: [phone]

## 2016-02-16 ENCOUNTER — Ambulatory Visit: Payer: PPO | Admitting: Podiatry

## 2016-02-20 ENCOUNTER — Telehealth: Payer: Self-pay | Admitting: Cardiovascular Disease

## 2016-02-20 NOTE — Telephone Encounter (Signed)
Can we set him up to see Dr. Rockey Situ tomorrow?  He is supposed to come in this week for 2 mo f/u, but does not have an appt scheduled.

## 2016-02-20 NOTE — Telephone Encounter (Signed)
Pt wife called states pt weight has dropped to 137. States pt is very tired. She thinks it may be his medications. Please call.

## 2016-02-20 NOTE — Telephone Encounter (Signed)
Spoke w/ pt's wife.  She reports that pt is going out of town tomorrow until 5/23, then has multiple appts when he gets back.  Per instructions at last ov 12/19/15:   Please decrease the coreg/cardvedilol in 1/2 twice a day Stay on lisinopril 5 mg daily Call the office if you have more dizziness when you stand up If symptoms are bad, we would hold the lisinopril Pt has reduced his coreg to 1/2 BID as instructed, but has continued to take lisinopril.   Pt does not check BP daily, last reading "the other day" 108/81, HR 64 or 81, he is not sure. Advised him to hold lisinopril and to monitor his BP, esp while symptomatic.  He is agreeable and will call the office in a few days if sx do not improve.

## 2016-02-23 ENCOUNTER — Ambulatory Visit: Payer: PPO | Admitting: Cardiovascular Disease

## 2016-03-02 ENCOUNTER — Ambulatory Visit (INDEPENDENT_AMBULATORY_CARE_PROVIDER_SITE_OTHER): Payer: PPO | Admitting: Podiatry

## 2016-03-02 ENCOUNTER — Encounter: Payer: Self-pay | Admitting: Podiatry

## 2016-03-02 DIAGNOSIS — M79676 Pain in unspecified toe(s): Secondary | ICD-10-CM | POA: Diagnosis not present

## 2016-03-02 DIAGNOSIS — B351 Tinea unguium: Secondary | ICD-10-CM

## 2016-03-06 ENCOUNTER — Telehealth: Payer: Self-pay | Admitting: *Deleted

## 2016-03-06 ENCOUNTER — Ambulatory Visit: Payer: PPO | Admitting: Podiatry

## 2016-03-06 NOTE — Progress Notes (Signed)
Patient ID: Ronnie Johnson, male   DOB: 1941-03-14, 75 y.o.   MRN: [id number]  Subjective: 75 y.o.-year-old male returns the office today for painful, elongated, thickened toenails which he is unable to trim himself. Denies any redness or drainage around the nails. Denies any acute changes since last appointment and no new complaints today. Denies any systemic complaints such as fevers, chills, nausea, vomiting.   Objective: AAO 3, NAD DP/PT pulses palpable, CRT less than 3 seconds Protective sensation intact with Simms Weinstein monofilament Nails hypertrophic, dystrophic, elongated, brittle, discolored 10. There is tenderness overlying the nails 1-5 bilaterally. There is no surrounding erythema or drainage along the nail sites. No open lesions or pre-ulcerative lesions are identified. No other areas of tenderness bilateral lower extremities. No overlying edema, erythema, increased warmth. No pain with calf compression, swelling, warmth, erythema.  Assessment: Patient presents with symptomatic onychomycosis. 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  Plan: -Treatment options including alternatives, risks, complications were discussed -Nails sharply debrided 10 without complication/bleeding. -Discussed daily foot inspection. If there are any changes, to call the office immediately.  -Follow-up in 9 weeks or sooner if any problems are to arise. In the meantime, encouraged to call the office with any questions, concerns, changes symptoms.   Celesta Gentile, DPM

## 2016-03-06 NOTE — Telephone Encounter (Signed)
Mrs. Ronnie Johnson called with an update on the patient.  They are out of town in [place].  Patient fell 5/27 around 0600 and was unable to stand.  EMS was called and patient was transported to the ED at [facility].  He was found to have a right femur fracture that was surgically repaired on 5/28.  He is currently still at the hospital with an unknown discharge date at this time.  Coumadin was held for surgery and replaced with Heparin.  Wife will call back with more information once it is available.

## 2016-03-06 NOTE — Telephone Encounter (Signed)
Called and LMOVM for the patient and wife.

## 2016-03-08 ENCOUNTER — Other Ambulatory Visit: Payer: Self-pay | Admitting: *Deleted

## 2016-03-08 ENCOUNTER — Encounter: Payer: Self-pay | Admitting: *Deleted

## 2016-03-08 ENCOUNTER — Ambulatory Visit: Payer: PPO

## 2016-03-08 NOTE — Patient Outreach (Signed)
[facility]) Care Management  03/08/2016  Ronnie Johnson [facility] 10-Jul-1941 [id number]   No response from patient outreach attempts, will proceed with case closure.  Objective: Per chart review: Patient has a history of pancreatic cancer, hypertension, and diabetes.  Assessment: Received [facility] referra on 02/10/16. Referral source: Ronnie Johnson. Referral reason: Disease and symptom management. [facility] diagnosed post chemo nausea and vomiting, failure to thrive, and length of stay 2 days. Comorbidity: Cancer and diabetes with no complications. Patient with recent hospitalization and discharges on 01/14/16. 1 ED visit in 6 months. Services requested: [place]. No response from patient outreach attempts, will proceed with case closure.   Plan: RNCM will send patient's primary MD case closure letter due to unable to reach patient.  RNCM will send case closure due to unable to reach request to Ronnie Johnson at [place] [facility].    Ronnie Moronta H. Annia Friendly, BSN, [place] [facility] Telephonic CM Phone: [phone] Fax: [phone]

## 2016-03-09 ENCOUNTER — Encounter: Admission: RE | Admit: 2016-03-09 | Payer: PPO | Source: Ambulatory Visit | Admitting: Internal Medicine

## 2016-03-09 ENCOUNTER — Encounter
Admission: RE | Admit: 2016-03-09 | Discharge: 2016-03-09 | Disposition: A | Discharge status: Home / Self Care | Payer: PPO | Source: Ambulatory Visit | Attending: Internal Medicine | Admitting: Internal Medicine

## 2016-03-09 DIAGNOSIS — G3184 Mild cognitive impairment, so stated: Secondary | ICD-10-CM | POA: Insufficient documentation

## 2016-03-09 DIAGNOSIS — R531 Weakness: Secondary | ICD-10-CM | POA: Insufficient documentation

## 2016-03-09 DIAGNOSIS — E119 Type 2 diabetes mellitus without complications: Secondary | ICD-10-CM | POA: Diagnosis not present

## 2016-03-09 DIAGNOSIS — Z79899 Other long term (current) drug therapy: Secondary | ICD-10-CM | POA: Insufficient documentation

## 2016-03-09 DIAGNOSIS — R443 Hallucinations, unspecified: Secondary | ICD-10-CM | POA: Diagnosis not present

## 2016-03-09 LAB — GLUCOSE, CAPILLARY
GLUCOSE-CAPILLARY: 168 mg/dL — AB (ref 65–99)
Glucose-Capillary: 166 mg/dL — ABNORMAL HIGH (ref 65–99)

## 2016-03-10 DIAGNOSIS — E119 Type 2 diabetes mellitus without complications: Secondary | ICD-10-CM | POA: Diagnosis not present

## 2016-03-10 LAB — GLUCOSE, CAPILLARY
GLUCOSE-CAPILLARY: 181 mg/dL — AB (ref 65–99)
GLUCOSE-CAPILLARY: 202 mg/dL — AB (ref 65–99)
Glucose-Capillary: 151 mg/dL — ABNORMAL HIGH (ref 65–99)
Glucose-Capillary: 166 mg/dL — ABNORMAL HIGH (ref 65–99)

## 2016-03-11 DIAGNOSIS — E119 Type 2 diabetes mellitus without complications: Secondary | ICD-10-CM | POA: Diagnosis not present

## 2016-03-11 LAB — GLUCOSE, CAPILLARY
GLUCOSE-CAPILLARY: 172 mg/dL — AB (ref 65–99)
Glucose-Capillary: 180 mg/dL — ABNORMAL HIGH (ref 65–99)
Glucose-Capillary: 187 mg/dL — ABNORMAL HIGH (ref 65–99)

## 2016-03-11 LAB — PROTIME-INR
INR: 4.43 — AB
Prothrombin Time: 41 seconds — ABNORMAL HIGH (ref 11.4–15.0)

## 2016-03-12 ENCOUNTER — Telehealth: Payer: Self-pay

## 2016-03-12 DIAGNOSIS — E119 Type 2 diabetes mellitus without complications: Secondary | ICD-10-CM | POA: Diagnosis not present

## 2016-03-12 LAB — GLUCOSE, CAPILLARY
GLUCOSE-CAPILLARY: 180 mg/dL — AB (ref 65–99)
GLUCOSE-CAPILLARY: 182 mg/dL — AB (ref 65–99)
Glucose-Capillary: 203 mg/dL — ABNORMAL HIGH (ref 65–99)
Glucose-Capillary: 156 mg/dL — ABNORMAL HIGH (ref 65–99)

## 2016-03-12 LAB — PROTIME-INR
INR: 4.85 — AB
PROTHROMBIN TIME: 43.9 s — AB (ref 11.4–15.0)

## 2016-03-12 NOTE — Telephone Encounter (Signed)
Izora Gala DPR signed left v/m requesting cb; pt was transferred from [facility] in [place] to [facility] with fx femur.Pt is at [facility] for rehab. Pt last seen annual exam 11/18/15.Izora Gala request cb from Dr Damita Dunnings; Pt not urinating well while in [facility]; pt had scan and was told bladder was full. End of last week pt had in and out catheter. Pt is still not voiding well.Pt is very aggitated and hallucinating and Izora Gala concerned could be UTI.pt also taking tramadol and that can cause hallucinations and aggitation. Pt is also being stuck qid for BS finger sticks. Izora Gala wants this stopped because pts fingers are raw.Pt wants to get Dr Josefine Class advise. I spoke with Shavon at [facility] and she said pts is assigned to Dr Ouida Sills. Shawna Clamp to talk with nurses at [facility] and they will contact Dr Ouida Sills. Izora Gala does not care for Dr Ouida Sills and she will speak with the nurse at [facility] but also request cb from Dr Damita Dunnings.

## 2016-03-12 NOTE — Telephone Encounter (Signed)
I can call her later but it won't affect the fact that I can't give orders for [facility].  I would encourage her to talk to attending MD in the meantime there. Please route back to me if I need to call her.  Thanks.

## 2016-03-12 NOTE — Telephone Encounter (Signed)
Izora Gala advised that you would call later.  Izora Gala says she has a funeral to attend at 1:30 pm today and if it's after hours this afternoon when you call, that is fine, maybe better.  She states she really appreciates your call when the patient was in the hospital in [place].  She didn't get the message until much later because she left her cell phone at home but she listened to the messages and appreciates your concern.

## 2016-03-12 NOTE — Telephone Encounter (Signed)
Called wife.  Case discussed.   Advised for her to d/w staff/MD at the facility.  I'll defer mgmt for now.  She agrees.   I wish them both well.   She thanked me for the call, will update me as needed.

## 2016-03-13 DIAGNOSIS — E119 Type 2 diabetes mellitus without complications: Secondary | ICD-10-CM | POA: Diagnosis not present

## 2016-03-13 LAB — GLUCOSE, CAPILLARY
Glucose-Capillary: 154 mg/dL — ABNORMAL HIGH (ref 65–99)
Glucose-Capillary: 148 mg/dL — ABNORMAL HIGH (ref 65–99)
Glucose-Capillary: 149 mg/dL — ABNORMAL HIGH (ref 65–99)
Glucose-Capillary: 146 mg/dL — ABNORMAL HIGH (ref 65–99)

## 2016-03-13 LAB — URINALYSIS COMPLETE WITH MICROSCOPIC (ARMC ONLY)
BACTERIA UA: NONE SEEN
Bilirubin Urine: NEGATIVE
Glucose, UA: NEGATIVE mg/dL
HGB URINE DIPSTICK: NEGATIVE
KETONES UR: NEGATIVE mg/dL
LEUKOCYTES UA: NEGATIVE
NITRITE: NEGATIVE
PH: 6 (ref 5.0–8.0)
PROTEIN: NEGATIVE mg/dL
SPECIFIC GRAVITY, URINE: 1.02 (ref 1.005–1.030)
Squamous Epithelial / LPF: NONE SEEN

## 2016-03-13 LAB — PROTIME-INR
INR: 3.89
Prothrombin Time: 37.2 seconds — ABNORMAL HIGH (ref 11.4–15.0)

## 2016-03-14 ENCOUNTER — Other Ambulatory Visit: Payer: Self-pay | Admitting: Gerontology

## 2016-03-14 DIAGNOSIS — Z79899 Other long term (current) drug therapy: Secondary | ICD-10-CM | POA: Diagnosis not present

## 2016-03-14 DIAGNOSIS — R531 Weakness: Secondary | ICD-10-CM | POA: Diagnosis not present

## 2016-03-14 DIAGNOSIS — G3184 Mild cognitive impairment, so stated: Secondary | ICD-10-CM | POA: Diagnosis present

## 2016-03-14 DIAGNOSIS — R443 Hallucinations, unspecified: Secondary | ICD-10-CM | POA: Diagnosis not present

## 2016-03-14 DIAGNOSIS — R410 Disorientation, unspecified: Secondary | ICD-10-CM

## 2016-03-14 DIAGNOSIS — E119 Type 2 diabetes mellitus without complications: Secondary | ICD-10-CM | POA: Diagnosis not present

## 2016-03-14 LAB — CBC WITH DIFFERENTIAL/PLATELET
BASOS ABS: 0.1 10*3/uL (ref 0–0.1)
Basophils Relative: 1 %
EOS PCT: 0 %
Eosinophils Absolute: 0 10*3/uL (ref 0–0.7)
HCT: 33.5 % — ABNORMAL LOW (ref 40.0–52.0)
Hemoglobin: 10.6 g/dL — ABNORMAL LOW (ref 13.0–18.0)
LYMPHS ABS: 0.9 10*3/uL — AB (ref 1.0–3.6)
LYMPHS PCT: 15 %
MCH: 24.2 pg — AB (ref 26.0–34.0)
MCHC: 31.8 g/dL — AB (ref 32.0–36.0)
MCV: 76.2 fL — AB (ref 80.0–100.0)
MONO ABS: 0.6 10*3/uL (ref 0.2–1.0)
Monocytes Relative: 9 %
Neutro Abs: 4.7 10*3/uL (ref 1.4–6.5)
Neutrophils Relative %: 75 %
PLATELETS: 268 10*3/uL (ref 150–440)
RBC: 4.39 MIL/uL — ABNORMAL LOW (ref 4.40–5.90)
RDW: 17.2 % — AB (ref 11.5–14.5)
WBC: 6.3 10*3/uL (ref 3.8–10.6)

## 2016-03-14 LAB — URINE CULTURE: CULTURE: NO GROWTH

## 2016-03-14 LAB — VITAMIN B12: Vitamin B-12: 3549 pg/mL — ABNORMAL HIGH (ref 180–914)

## 2016-03-14 LAB — COMPREHENSIVE METABOLIC PANEL WITH GFR
ALT: 11 U/L — ABNORMAL LOW (ref 17–63)
AST: 17 U/L (ref 15–41)
Albumin: 2.9 g/dL — ABNORMAL LOW (ref 3.5–5.0)
Alkaline Phosphatase: 70 U/L (ref 38–126)
Anion gap: 9 (ref 5–15)
BUN: 9 mg/dL (ref 6–20)
CO2: 27 mmol/L (ref 22–32)
Calcium: 9 mg/dL (ref 8.9–10.3)
Chloride: 99 mmol/L — ABNORMAL LOW (ref 101–111)
Creatinine, Ser: 0.42 mg/dL — ABNORMAL LOW (ref 0.61–1.24)
GFR calc Af Amer: >60 mL/min (ref >60)
GFR calc non Af Amer: >60 mL/min (ref >60)
Glucose, Bld: 153 mg/dL — ABNORMAL HIGH (ref 65–99)
Potassium: 4.1 mmol/L (ref 3.5–5.1)
Sodium: 135 mmol/L (ref 135–145)
Total Bilirubin: 0.8 mg/dL (ref 0.3–1.2)
Total Protein: 6 g/dL — ABNORMAL LOW (ref 6.5–8.1)

## 2016-03-14 LAB — TSH: TSH: 3.092 u[IU]/mL (ref 0.350–4.500)

## 2016-03-14 LAB — AMMONIA: Ammonia: 63 umol/L — ABNORMAL HIGH (ref 9–35)

## 2016-03-14 LAB — PROTIME-INR
INR: 2.54
Prothrombin Time: 27 s — ABNORMAL HIGH (ref 11.4–15.0)

## 2016-03-14 LAB — MAGNESIUM: Magnesium: 1.4 mg/dL — ABNORMAL LOW (ref 1.7–2.4)

## 2016-03-14 LAB — GLUCOSE, CAPILLARY
GLUCOSE-CAPILLARY: 170 mg/dL — AB (ref 65–99)
Glucose-Capillary: 164 mg/dL — ABNORMAL HIGH (ref 65–99)
Glucose-Capillary: 142 mg/dL — ABNORMAL HIGH (ref 65–99)

## 2016-03-15 ENCOUNTER — Encounter (HOSPITAL_COMMUNITY): Payer: Self-pay

## 2016-03-15 ENCOUNTER — Ambulatory Visit: Payer: PPO

## 2016-03-15 ENCOUNTER — Ambulatory Visit (HOSPITAL_COMMUNITY)
Admission: RE | Admit: 2016-03-15 | Discharge: 2016-03-15 | Disposition: A | Discharge status: Home / Self Care | Payer: PPO | Source: Ambulatory Visit | Attending: Gerontology | Admitting: Gerontology

## 2016-03-15 DIAGNOSIS — R410 Disorientation, unspecified: Secondary | ICD-10-CM | POA: Diagnosis not present

## 2016-03-15 DIAGNOSIS — E119 Type 2 diabetes mellitus without complications: Secondary | ICD-10-CM | POA: Diagnosis not present

## 2016-03-15 LAB — BASIC METABOLIC PANEL
ANION GAP: 8 (ref 5–15)
BUN: 8 mg/dL (ref 6–20)
CALCIUM: 9.1 mg/dL (ref 8.9–10.3)
CHLORIDE: 99 mmol/L — AB (ref 101–111)
CO2: 29 mmol/L (ref 22–32)
Creatinine, Ser: 0.48 mg/dL — ABNORMAL LOW (ref 0.61–1.24)
GFR calc non Af Amer: >60 mL/min (ref >60)
GLUCOSE: 139 mg/dL — AB (ref 65–99)
Potassium: 3.8 mmol/L (ref 3.5–5.1)
SODIUM: 136 mmol/L (ref 135–145)

## 2016-03-15 LAB — GLUCOSE, CAPILLARY
GLUCOSE-CAPILLARY: 133 mg/dL — AB (ref 65–99)
Glucose-Capillary: 148 mg/dL — ABNORMAL HIGH (ref 65–99)

## 2016-03-15 LAB — VITAMIN D 25 HYDROXY (VIT D DEFICIENCY, FRACTURES): Vit D, 25-Hydroxy: 32.9 ng/mL (ref 30.0–100.0)

## 2016-03-16 DIAGNOSIS — E119 Type 2 diabetes mellitus without complications: Secondary | ICD-10-CM | POA: Diagnosis not present

## 2016-03-16 LAB — PROTIME-INR
INR: 1.77
PROTHROMBIN TIME: 20.6 s — AB (ref 11.4–15.0)

## 2016-03-16 LAB — AMMONIA: AMMONIA: 17 umol/L (ref 9–35)

## 2016-03-16 LAB — GLUCOSE, CAPILLARY
GLUCOSE-CAPILLARY: 155 mg/dL — AB (ref 65–99)
GLUCOSE-CAPILLARY: 129 mg/dL — AB (ref 65–99)

## 2016-03-17 DIAGNOSIS — E119 Type 2 diabetes mellitus without complications: Secondary | ICD-10-CM | POA: Diagnosis not present

## 2016-03-17 LAB — GLUCOSE, CAPILLARY
Glucose-Capillary: 97 mg/dL (ref 65–99)
Glucose-Capillary: 118 mg/dL — ABNORMAL HIGH (ref 65–99)

## 2016-03-17 LAB — PROTIME-INR
INR: 2.29
PROTHROMBIN TIME: 25 s — AB (ref 11.4–15.0)

## 2016-03-18 DIAGNOSIS — E119 Type 2 diabetes mellitus without complications: Secondary | ICD-10-CM | POA: Diagnosis not present

## 2016-03-18 LAB — GLUCOSE, CAPILLARY
Glucose-Capillary: 122 mg/dL — ABNORMAL HIGH (ref 65–99)
Glucose-Capillary: 148 mg/dL — ABNORMAL HIGH (ref 65–99)

## 2016-03-18 LAB — AMMONIA: Ammonia: 27 umol/L (ref 9–35)

## 2016-03-19 ENCOUNTER — Telehealth: Payer: Self-pay | Admitting: Family Medicine

## 2016-03-19 DIAGNOSIS — E119 Type 2 diabetes mellitus without complications: Secondary | ICD-10-CM | POA: Diagnosis not present

## 2016-03-19 LAB — GLUCOSE, CAPILLARY: Glucose-Capillary: 120 mg/dL — ABNORMAL HIGH (ref 65–99)

## 2016-03-19 NOTE — Telephone Encounter (Signed)
Pt at [facility].  PT's wife wants the patient to go to the hospital.  She said [name] has something going on mentally and he is seeing things and he is scared to death.  She doesn't know if it's medicine she is giving him or what.  She needs a call back to see if there is something she can do.  [phone]

## 2016-03-19 NOTE — Telephone Encounter (Signed)
Called and LMOVM.  I'll check with them tomorrow.  No confidential info left on message.

## 2016-03-20 DIAGNOSIS — E119 Type 2 diabetes mellitus without complications: Secondary | ICD-10-CM | POA: Diagnosis not present

## 2016-03-20 LAB — CBC WITH DIFFERENTIAL/PLATELET
BASOS PCT: 1 %
Basophils Absolute: 0.1 10*3/uL (ref 0–0.1)
EOS PCT: 1 %
Eosinophils Absolute: 0.1 10*3/uL (ref 0–0.7)
HEMATOCRIT: 33.6 % — AB (ref 40.0–52.0)
Hemoglobin: 10.6 g/dL — ABNORMAL LOW (ref 13.0–18.0)
LYMPHS ABS: 1.2 10*3/uL (ref 1.0–3.6)
Lymphocytes Relative: 20 %
MCH: 23.8 pg — AB (ref 26.0–34.0)
MCHC: 31.5 g/dL — ABNORMAL LOW (ref 32.0–36.0)
MCV: 75.5 fL — AB (ref 80.0–100.0)
Monocytes Absolute: 0.6 10*3/uL (ref 0.2–1.0)
Monocytes Relative: 10 %
NEUTROS ABS: 3.9 10*3/uL (ref 1.4–6.5)
Neutrophils Relative %: 68 %
PLATELETS: 277 10*3/uL (ref 150–440)
RBC: 4.46 MIL/uL (ref 4.40–5.90)
RDW: 17.4 % — AB (ref 11.5–14.5)
WBC: 5.9 10*3/uL (ref 3.8–10.6)

## 2016-03-20 LAB — COMPREHENSIVE METABOLIC PANEL
ALK PHOS: 82 U/L (ref 38–126)
ALT: 7 U/L — AB (ref 17–63)
AST: 14 U/L — ABNORMAL LOW (ref 15–41)
Albumin: 3.1 g/dL — ABNORMAL LOW (ref 3.5–5.0)
Anion gap: 9 (ref 5–15)
BUN: 7 mg/dL (ref 6–20)
CHLORIDE: 101 mmol/L (ref 101–111)
CO2: 28 mmol/L (ref 22–32)
Calcium: 8.7 mg/dL — ABNORMAL LOW (ref 8.9–10.3)
Creatinine, Ser: 0.45 mg/dL — ABNORMAL LOW (ref 0.61–1.24)
GFR calc Af Amer: >60 mL/min (ref >60)
Glucose, Bld: 111 mg/dL — ABNORMAL HIGH (ref 65–99)
POTASSIUM: 4 mmol/L (ref 3.5–5.1)
Sodium: 138 mmol/L (ref 135–145)
Total Bilirubin: 0.7 mg/dL (ref 0.3–1.2)
Total Protein: 6.1 g/dL — ABNORMAL LOW (ref 6.5–8.1)

## 2016-03-20 LAB — GLUCOSE, CAPILLARY
GLUCOSE-CAPILLARY: 200 mg/dL — AB (ref 65–99)
Glucose-Capillary: 138 mg/dL — ABNORMAL HIGH (ref 65–99)

## 2016-03-20 LAB — AMMONIA: AMMONIA: 72 umol/L — AB (ref 9–35)

## 2016-03-20 LAB — PROTIME-INR
INR: 3.3
Prothrombin Time: 32.9 seconds — ABNORMAL HIGH (ref 11.4–15.0)

## 2016-03-20 NOTE — Telephone Encounter (Signed)
Dr. Damita Dunnings asked that I phone Ronnie Johnson to explain again that he could not give orders in the facility and to ask if she had talked to the current MD/staff about this and what their plan is.  I spoke with Ronnie Johnson and she said that she felt like Ronnie Johnson would take his medicine if Dr. Damita Dunnings spoke with him and encouraged him to take it.  I phoned the patient and spoke directly with him and explained the importance of his taking his medication.  Patient says it is liquid and comes in a cup and is about like motor oil.  I asked if he could maybe drink something right arter taking it to help get the taste or texture out of his mouth.  He says he will ask the staff about it.  Patient says he had some bad dreams and felt like it was because of this medication.  Patient agreed that he would try to take the medicine.  Patient asked if he could get an appointment to come see Dr. Damita Dunnings as soon as he gets out of the facility.  I will call Ronnie Johnson (wife) to see if we need to get an appointment set up.

## 2016-03-20 NOTE — Telephone Encounter (Signed)
Called wife.  Patient is better today.  There was the question (posed by his wife) about seroquel use and possible AMS/side effects.  I advised her to talk with the ordering MD in the meantime.  Per wife, patient had trouble with thorazine prev when used for hiccups.   She thanked me for the call.  I didn't give orders as he is currently at the facility under another provider's care.

## 2016-03-20 NOTE — Telephone Encounter (Signed)
Ronnie Johnson left v/m; pt's ammonia level is going back up and facility wants to give lactulose every hour;pt has refused med. Ronnie Johnson request cb. Last time pts ammonia level went up pt got very combative.

## 2016-03-20 NOTE — Telephone Encounter (Signed)
Mrs Viggiano left v/m; pts ammonia level 2 days ago was 27 and today his ammonia level is 72. Mrs Mcclammy said pt is still refusing lactulose and Mrs Schoepp is asking Dr Damita Dunnings to call and speak with pt; Mrs Francke thinks pt will take lactulose if Dr Damita Dunnings tells him to. Mrs Schutt request cb.

## 2016-03-20 NOTE — Telephone Encounter (Signed)
Please call her.  I'm sympathetic but I can't give orders in the facility.  Has she talked to the current MD/staff? What is their plan?

## 2016-03-21 NOTE — Telephone Encounter (Signed)
Noted. Thanks.

## 2016-03-21 NOTE — Telephone Encounter (Addendum)
Mrs Wallar left v/m pt is being discharged from [facility] today. Pt was given Seroquel last night after Mrs Ludeke requested that they not give pt the Seroquel. Pt is very sleepy this morning. Pt drank one cup of lactulose last night; will know ammonia level later today. Pt already has 30 min f/u appt on 03/23/16 at 12:15 with Dr Damita Dunnings.

## 2016-03-21 NOTE — Telephone Encounter (Signed)
I thank all involved. 

## 2016-03-23 ENCOUNTER — Other Ambulatory Visit (INDEPENDENT_AMBULATORY_CARE_PROVIDER_SITE_OTHER): Payer: PPO

## 2016-03-23 ENCOUNTER — Ambulatory Visit (INDEPENDENT_AMBULATORY_CARE_PROVIDER_SITE_OTHER): Payer: PPO | Admitting: Family Medicine

## 2016-03-23 ENCOUNTER — Encounter: Payer: Self-pay | Admitting: Family Medicine

## 2016-03-23 VITALS — BP 142/74 | HR 73 | Temp 97.4°F | Wt 134.8 lb

## 2016-03-23 DIAGNOSIS — Z9181 History of falling: Secondary | ICD-10-CM

## 2016-03-23 DIAGNOSIS — I82403 Acute embolism and thrombosis of unspecified deep veins of lower extremity, bilateral: Secondary | ICD-10-CM | POA: Diagnosis not present

## 2016-03-23 DIAGNOSIS — G4700 Insomnia, unspecified: Secondary | ICD-10-CM

## 2016-03-23 DIAGNOSIS — Z5181 Encounter for therapeutic drug level monitoring: Secondary | ICD-10-CM

## 2016-03-23 DIAGNOSIS — C259 Malignant neoplasm of pancreas, unspecified: Secondary | ICD-10-CM

## 2016-03-23 DIAGNOSIS — C787 Secondary malignant neoplasm of liver and intrahepatic bile duct: Secondary | ICD-10-CM

## 2016-03-23 DIAGNOSIS — R4182 Altered mental status, unspecified: Secondary | ICD-10-CM | POA: Diagnosis not present

## 2016-03-23 DIAGNOSIS — E119 Type 2 diabetes mellitus without complications: Secondary | ICD-10-CM | POA: Diagnosis not present

## 2016-03-23 DIAGNOSIS — Z7901 Long term (current) use of anticoagulants: Secondary | ICD-10-CM

## 2016-03-23 LAB — CBC WITH DIFFERENTIAL/PLATELET
BASOS PCT: 0.4 % (ref 0.0–3.0)
Basophils Absolute: 0 10*3/uL (ref 0.0–0.1)
EOS ABS: 0 10*3/uL (ref 0.0–0.7)
Eosinophils Relative: 0.6 % (ref 0.0–5.0)
HCT: 36.5 % — ABNORMAL LOW (ref 39.0–52.0)
Hemoglobin: 11.4 g/dL — ABNORMAL LOW (ref 13.0–17.0)
Lymphocytes Relative: 16 % (ref 12.0–46.0)
Lymphs Abs: 1.1 10*3/uL (ref 0.7–4.0)
MCHC: 31.1 g/dL (ref 30.0–36.0)
MCV: 75.6 fl — ABNORMAL LOW (ref 78.0–100.0)
MONO ABS: 0.6 10*3/uL (ref 0.1–1.0)
Monocytes Relative: 8.3 % (ref 3.0–12.0)
NEUTROS ABS: 5.1 10*3/uL (ref 1.4–7.7)
NEUTROS PCT: 74.7 % (ref 43.0–77.0)
PLATELETS: 272 10*3/uL (ref 150.0–400.0)
RBC: 4.83 Mil/uL (ref 4.22–5.81)
RDW: 17.8 % — AB (ref 11.5–15.5)
WBC: 6.8 10*3/uL (ref 4.0–10.5)

## 2016-03-23 LAB — COMPREHENSIVE METABOLIC PANEL
ALBUMIN: 3.6 g/dL (ref 3.5–5.2)
ALT: 8 U/L (ref 0–53)
AST: 13 U/L (ref 0–37)
Alkaline Phosphatase: 101 U/L (ref 39–117)
BUN: 10 mg/dL (ref 6–23)
CHLORIDE: 97 meq/L (ref 96–112)
CO2: 32 mEq/L (ref 19–32)
CREATININE: 0.56 mg/dL (ref 0.40–1.50)
Calcium: 9.6 mg/dL (ref 8.4–10.5)
GFR: 151.55 mL/min (ref >60.00)
Glucose, Bld: 256 mg/dL — ABNORMAL HIGH (ref 70–99)
Potassium: 3.8 mEq/L (ref 3.5–5.1)
SODIUM: 137 meq/L (ref 135–145)
Total Bilirubin: 0.5 mg/dL (ref 0.2–1.2)
Total Protein: 6.8 g/dL (ref 6.0–8.3)

## 2016-03-23 LAB — POCT INR: INR: 1.4

## 2016-03-23 LAB — HEMOGLOBIN A1C: HEMOGLOBIN A1C: 7.8 % — AB (ref 4.6–6.5)

## 2016-03-23 MED ORDER — TRAMADOL HCL 50 MG PO TABS: 50.0000 mg | ORAL_TABLET | Freq: Two times a day (BID) | ORAL | Status: DC | PRN

## 2016-03-23 MED ORDER — LACTULOSE 20 GM/30ML PO SOLN: 20.0000 g | Freq: Every day | ORAL | Status: DC | PRN

## 2016-03-23 MED ORDER — WARFARIN SODIUM 5 MG PO TABS: ORAL_TABLET | ORAL | Status: DC

## 2016-03-23 NOTE — Patient Instructions (Addendum)
Try melatonin at night for a few nights.  If not better, then try unisom.   If not better we'll have to consider other options.   Get the raised toilet set in the meantime.  We'll keep a copy of the papers for the walker.  Keep using the walker.  Coumadin 5mg  a day for now.  Recheck INR in 1 week at [facility].  Go to the lab on the way out.  We'll contact you with your lab report. Take tramadol as needed for pain.   Take lactulose as needed for constipation or confusion.  Take care.  Glad to see you.

## 2016-03-23 NOTE — Progress Notes (Signed)
Pre visit review using our clinic review tool, if applicable. No additional management support is needed unless otherwise documented below in the visit note.  Admitted out of town with R femur fx after a fall, s/p repair.  Weight bearing with walker now, is s/p rehab stay at [facility].  Was having hallucinations before getting to rehab, less severe by the time he got to [place].  No hallucinations now.  Off tramadol and seroquel in the meantime.  Mild leg pain now, ~3/10.    Abd pain continues from pancreatic cancer.  Off insulin recently.  Had used tramadol for that prev. Using a heating pad for the abd pain, with some relief.  No vomiting, no diarrhea.  Eating a small amount at a time, as tolerated, variable from day to day.  We talked about changing 1 med at a time, with trial of tramadol for abd pain since he has been able to tolerate prev w/o hallucinations.   Insomnia.  Had taken [place] once.  Some trouble getting to sleep.   D/w pt about options.   Meds, vitals, and allergies reviewed.   ROS: Per HPI unless specifically indicated in ROS section   nad but chronically ill appearing, with weight loss noted from prev baseline prior to CA dx Op wnl, mmm Neck supple, no LA rrr ctab abd soft, not rebound, minimally ttp in the epigastrum Ext w/o edema Walking with a walker.

## 2016-03-25 DIAGNOSIS — G4700 Insomnia, unspecified: Secondary | ICD-10-CM | POA: Insufficient documentation

## 2016-03-25 DIAGNOSIS — Z7901 Long term (current) use of anticoagulants: Secondary | ICD-10-CM | POA: Insufficient documentation

## 2016-03-25 NOTE — Assessment & Plan Note (Signed)
Try melatonin at night for a few nights.  If not better, then try unisom.   If not better we'll have to consider other options.

## 2016-03-25 NOTE — Assessment & Plan Note (Signed)
Coumadin 5mg  a day for now.  Recheck INR in 1 week at [facility].  >25 minutes spent in face to face time with patient, >50% spent in counselling or coordination of care.

## 2016-03-25 NOTE — Assessment & Plan Note (Signed)
With deconditioning.   Get the raised toilet set in the meantime- needed given the deconditioning.  rx given to patient.   Paperwork done for walker.  Take tramadol as needed for pain.   Take lactulose as needed for constipation or confusion.  He agrees.  Update me as needed.

## 2016-03-29 ENCOUNTER — Other Ambulatory Visit (INDEPENDENT_AMBULATORY_CARE_PROVIDER_SITE_OTHER): Payer: PPO

## 2016-03-29 DIAGNOSIS — Z5181 Encounter for therapeutic drug level monitoring: Secondary | ICD-10-CM

## 2016-03-29 DIAGNOSIS — I82403 Acute embolism and thrombosis of unspecified deep veins of lower extremity, bilateral: Secondary | ICD-10-CM

## 2016-03-29 LAB — POCT INR: INR: 1.9

## 2016-03-30 ENCOUNTER — Inpatient Hospital Stay
Admission: EM | Admit: 2016-03-30 | Discharge: 2016-04-02 | DRG: 441 | Disposition: A | Discharge status: Home / Self Care | Payer: PPO | Attending: Internal Medicine | Admitting: Internal Medicine

## 2016-03-30 ENCOUNTER — Encounter: Payer: Self-pay | Admitting: Emergency Medicine

## 2016-03-30 ENCOUNTER — Other Ambulatory Visit: Payer: Self-pay

## 2016-03-30 ENCOUNTER — Telehealth: Payer: Self-pay

## 2016-03-30 ENCOUNTER — Emergency Department: Payer: PPO

## 2016-03-30 DIAGNOSIS — Z888 Allergy status to other drugs, medicaments and biological substances status: Secondary | ICD-10-CM

## 2016-03-30 DIAGNOSIS — Z87891 Personal history of nicotine dependence: Secondary | ICD-10-CM

## 2016-03-30 DIAGNOSIS — Z87442 Personal history of urinary calculi: Secondary | ICD-10-CM

## 2016-03-30 DIAGNOSIS — Z79899 Other long term (current) drug therapy: Secondary | ICD-10-CM

## 2016-03-30 DIAGNOSIS — R531 Weakness: Secondary | ICD-10-CM

## 2016-03-30 DIAGNOSIS — E039 Hypothyroidism, unspecified: Secondary | ICD-10-CM | POA: Diagnosis present

## 2016-03-30 DIAGNOSIS — Z885 Allergy status to narcotic agent status: Secondary | ICD-10-CM

## 2016-03-30 DIAGNOSIS — C787 Secondary malignant neoplasm of liver and intrahepatic bile duct: Secondary | ICD-10-CM | POA: Diagnosis present

## 2016-03-30 DIAGNOSIS — Z91040 Latex allergy status: Secondary | ICD-10-CM

## 2016-03-30 DIAGNOSIS — R278 Other lack of coordination: Secondary | ICD-10-CM | POA: Diagnosis present

## 2016-03-30 DIAGNOSIS — Z9049 Acquired absence of other specified parts of digestive tract: Secondary | ICD-10-CM

## 2016-03-30 DIAGNOSIS — K219 Gastro-esophageal reflux disease without esophagitis: Secondary | ICD-10-CM | POA: Diagnosis present

## 2016-03-30 DIAGNOSIS — K7290 Hepatic failure, unspecified without coma: Secondary | ICD-10-CM | POA: Diagnosis not present

## 2016-03-30 DIAGNOSIS — G9341 Metabolic encephalopathy: Secondary | ICD-10-CM | POA: Diagnosis present

## 2016-03-30 DIAGNOSIS — Z8249 Family history of ischemic heart disease and other diseases of the circulatory system: Secondary | ICD-10-CM

## 2016-03-30 DIAGNOSIS — Z66 Do not resuscitate: Secondary | ICD-10-CM | POA: Diagnosis present

## 2016-03-30 DIAGNOSIS — Z820 Family history of epilepsy and other diseases of the nervous system: Secondary | ICD-10-CM

## 2016-03-30 DIAGNOSIS — K7689 Other specified diseases of liver: Secondary | ICD-10-CM | POA: Diagnosis present

## 2016-03-30 DIAGNOSIS — Z8507 Personal history of malignant neoplasm of pancreas: Secondary | ICD-10-CM

## 2016-03-30 DIAGNOSIS — R4182 Altered mental status, unspecified: Secondary | ICD-10-CM

## 2016-03-30 DIAGNOSIS — E119 Type 2 diabetes mellitus without complications: Secondary | ICD-10-CM | POA: Diagnosis present

## 2016-03-30 DIAGNOSIS — Z8042 Family history of malignant neoplasm of prostate: Secondary | ICD-10-CM

## 2016-03-30 DIAGNOSIS — Z7901 Long term (current) use of anticoagulants: Secondary | ICD-10-CM

## 2016-03-30 DIAGNOSIS — Z833 Family history of diabetes mellitus: Secondary | ICD-10-CM

## 2016-03-30 DIAGNOSIS — I2510 Atherosclerotic heart disease of native coronary artery without angina pectoris: Secondary | ICD-10-CM | POA: Diagnosis present

## 2016-03-30 DIAGNOSIS — G8929 Other chronic pain: Secondary | ICD-10-CM | POA: Diagnosis present

## 2016-03-30 DIAGNOSIS — I10 Essential (primary) hypertension: Secondary | ICD-10-CM | POA: Diagnosis present

## 2016-03-30 DIAGNOSIS — I482 Chronic atrial fibrillation: Secondary | ICD-10-CM | POA: Diagnosis present

## 2016-03-30 DIAGNOSIS — K7682 Hepatic encephalopathy: Secondary | ICD-10-CM

## 2016-03-30 DIAGNOSIS — C259 Malignant neoplasm of pancreas, unspecified: Secondary | ICD-10-CM | POA: Diagnosis present

## 2016-03-30 DIAGNOSIS — Z86718 Personal history of other venous thrombosis and embolism: Secondary | ICD-10-CM

## 2016-03-30 DIAGNOSIS — Z96652 Presence of left artificial knee joint: Secondary | ICD-10-CM | POA: Diagnosis present

## 2016-03-30 DIAGNOSIS — Z823 Family history of stroke: Secondary | ICD-10-CM

## 2016-03-30 LAB — URINALYSIS COMPLETE WITH MICROSCOPIC (ARMC ONLY)
BILIRUBIN URINE: NEGATIVE
Bacteria, UA: NONE SEEN
GLUCOSE, UA: NEGATIVE mg/dL
HGB URINE DIPSTICK: NEGATIVE
LEUKOCYTES UA: NEGATIVE
NITRITE: NEGATIVE
Protein, ur: 30 mg/dL — AB
SPECIFIC GRAVITY, URINE: 1.025 (ref 1.005–1.030)
pH: 5 (ref 5.0–8.0)

## 2016-03-30 LAB — COMPREHENSIVE METABOLIC PANEL
ALT: 12 U/L — AB (ref 17–63)
AST: 18 U/L (ref 15–41)
Albumin: 2.8 g/dL — ABNORMAL LOW (ref 3.5–5.0)
Alkaline Phosphatase: 98 U/L (ref 38–126)
Anion gap: 8 (ref 5–15)
BUN: 9 mg/dL (ref 6–20)
CHLORIDE: 101 mmol/L (ref 101–111)
CO2: 28 mmol/L (ref 22–32)
CREATININE: 0.34 mg/dL — AB (ref 0.61–1.24)
Calcium: 8.7 mg/dL — ABNORMAL LOW (ref 8.9–10.3)
GFR calc Af Amer: >60 mL/min (ref >60)
GFR calc non Af Amer: >60 mL/min (ref >60)
GLUCOSE: 142 mg/dL — AB (ref 65–99)
Potassium: 3.4 mmol/L — ABNORMAL LOW (ref 3.5–5.1)
SODIUM: 137 mmol/L (ref 135–145)
Total Bilirubin: 0.9 mg/dL (ref 0.3–1.2)
Total Protein: 6 g/dL — ABNORMAL LOW (ref 6.5–8.1)

## 2016-03-30 LAB — HEPATIC FUNCTION PANEL
ALBUMIN: 3 g/dL — AB (ref 3.5–5.0)
ALK PHOS: 98 U/L (ref 38–126)
ALT: 13 U/L — AB (ref 17–63)
AST: 19 U/L (ref 15–41)
BILIRUBIN INDIRECT: 0.2 mg/dL — AB (ref 0.3–0.9)
Bilirubin, Direct: 0.2 mg/dL (ref 0.1–0.5)
TOTAL PROTEIN: 6.1 g/dL — AB (ref 6.5–8.1)
Total Bilirubin: 0.4 mg/dL (ref 0.3–1.2)

## 2016-03-30 LAB — BASIC METABOLIC PANEL
Anion gap: 8 (ref 5–15)
BUN: 10 mg/dL (ref 6–20)
CALCIUM: 9.1 mg/dL (ref 8.9–10.3)
CHLORIDE: 99 mmol/L — AB (ref 101–111)
CO2: 29 mmol/L (ref 22–32)
CREATININE: 0.43 mg/dL — AB (ref 0.61–1.24)
Glucose, Bld: 172 mg/dL — ABNORMAL HIGH (ref 65–99)
Potassium: 3.7 mmol/L (ref 3.5–5.1)
SODIUM: 136 mmol/L (ref 135–145)

## 2016-03-30 LAB — CBC WITH DIFFERENTIAL/PLATELET
Basophils Absolute: 0 10*3/uL (ref 0–0.1)
Basophils Relative: 1 %
EOS ABS: 0 10*3/uL (ref 0–0.7)
EOS PCT: 1 %
HCT: 34.2 % — ABNORMAL LOW (ref 40.0–52.0)
HEMOGLOBIN: 10.8 g/dL — AB (ref 13.0–18.0)
LYMPHS ABS: 0.9 10*3/uL — AB (ref 1.0–3.6)
LYMPHS PCT: 17 %
MCH: 23.5 pg — AB (ref 26.0–34.0)
MCHC: 31.5 g/dL — AB (ref 32.0–36.0)
MCV: 74.5 fL — AB (ref 80.0–100.0)
MONOS PCT: 9 %
Monocytes Absolute: 0.5 10*3/uL (ref 0.2–1.0)
Neutro Abs: 3.8 10*3/uL (ref 1.4–6.5)
Neutrophils Relative %: 72 %
PLATELETS: 187 10*3/uL (ref 150–440)
RBC: 4.59 MIL/uL (ref 4.40–5.90)
RDW: 17.8 % — ABNORMAL HIGH (ref 11.5–14.5)
WBC: 5.2 10*3/uL (ref 3.8–10.6)

## 2016-03-30 LAB — CBC
HCT: 36 % — ABNORMAL LOW (ref 40.0–52.0)
Hemoglobin: 11.3 g/dL — ABNORMAL LOW (ref 13.0–18.0)
MCH: 23.4 pg — ABNORMAL LOW (ref 26.0–34.0)
MCHC: 31.4 g/dL — ABNORMAL LOW (ref 32.0–36.0)
MCV: 74.5 fL — ABNORMAL LOW (ref 80.0–100.0)
PLATELETS: 201 10*3/uL (ref 150–440)
RBC: 4.83 MIL/uL (ref 4.40–5.90)
RDW: 17.8 % — AB (ref 11.5–14.5)
WBC: 5.8 10*3/uL (ref 3.8–10.6)

## 2016-03-30 LAB — PROTIME-INR
INR: 2.15
Prothrombin Time: 23.8 seconds — ABNORMAL HIGH (ref 11.4–15.0)

## 2016-03-30 LAB — LACTIC ACID, PLASMA
Lactic Acid, Venous: 1.2 mmol/L (ref 0.5–2.0)
Lactic Acid, Venous: 1.2 mmol/L (ref 0.5–2.0)

## 2016-03-30 LAB — LIPASE, BLOOD: Lipase: <10 U/L — ABNORMAL LOW (ref 11–51)

## 2016-03-30 LAB — TROPONIN I

## 2016-03-30 LAB — GLUCOSE, CAPILLARY
GLUCOSE-CAPILLARY: 151 mg/dL — AB (ref 65–99)
Glucose-Capillary: 130 mg/dL — ABNORMAL HIGH (ref 65–99)

## 2016-03-30 LAB — AMMONIA: AMMONIA: 27 umol/L (ref 9–35)

## 2016-03-30 MED ORDER — WARFARIN SODIUM 5 MG PO TABS
5.0000 mg | ORAL_TABLET | ORAL | Status: DC
  Administered 2016-03-31: 18:00:00 5 mg via ORAL
  Filled 2016-03-30: qty 1

## 2016-03-30 MED ORDER — LEVOTHYROXINE SODIUM 50 MCG PO TABS
25.0000 ug | ORAL_TABLET | Freq: Every day | ORAL | Status: DC
  Administered 2016-03-31 – 2016-04-02 (×3): 25 ug via ORAL
  Filled 2016-03-30 (×3): qty 1

## 2016-03-30 MED ORDER — MAGNESIUM OXIDE 400 (241.3 MG) MG PO TABS
200.0000 mg | ORAL_TABLET | Freq: Every day | ORAL | Status: DC
Start: 2016-03-30 — End: 2016-04-02
  Administered 2016-03-31 – 2016-04-02 (×3): 200 mg via ORAL
  Filled 2016-03-30 (×3): qty 1

## 2016-03-30 MED ORDER — WARFARIN - PHARMACIST DOSING INPATIENT
Freq: Every day | Status: DC
  Administered 2016-04-01: 17:00:00

## 2016-03-30 MED ORDER — ENOXAPARIN SODIUM 40 MG/0.4ML ~~LOC~~ SOLN: 40.0000 mg | SUBCUTANEOUS | Status: DC

## 2016-03-30 MED ORDER — SODIUM CHLORIDE 0.9 % IV SOLN
INTRAVENOUS | Status: DC
  Administered 2016-03-30 – 2016-04-01 (×5): via INTRAVENOUS

## 2016-03-30 MED ORDER — LORAZEPAM 2 MG/ML IJ SOLN
2.0000 mg | Freq: Once | INTRAMUSCULAR | Status: AC
  Administered 2016-03-30: 2 mg via INTRAVENOUS
  Filled 2016-03-30: qty 1

## 2016-03-30 MED ORDER — PANTOPRAZOLE SODIUM 40 MG PO TBEC
40.0000 mg | DELAYED_RELEASE_TABLET | Freq: Every day | ORAL | Status: DC
  Administered 2016-03-31 – 2016-04-02 (×3): 40 mg via ORAL
  Filled 2016-03-30 (×3): qty 1

## 2016-03-30 MED ORDER — SODIUM CHLORIDE 0.9 % IV BOLUS (SEPSIS)
1000.0000 mL | Freq: Once | INTRAVENOUS | Status: AC
  Administered 2016-03-30: 1000 mL via INTRAVENOUS

## 2016-03-30 MED ORDER — INSULIN ASPART 100 UNIT/ML ~~LOC~~ SOLN
0.0000 [IU] | Freq: Three times a day (TID) | SUBCUTANEOUS | Status: DC
  Administered 2016-03-31 – 2016-04-02 (×5): 1 [IU] via SUBCUTANEOUS
  Filled 2016-03-30 (×5): qty 1

## 2016-03-30 MED ORDER — LACTULOSE 10 GM/15ML PO SOLN
30.0000 g | Freq: Once | ORAL | Status: AC
  Administered 2016-03-30: 30 g via ORAL
  Filled 2016-03-30: qty 60

## 2016-03-30 MED ORDER — MORPHINE SULFATE (PF) 2 MG/ML IV SOLN
2.0000 mg | INTRAVENOUS | Status: DC | PRN
  Administered 2016-03-30 – 2016-03-31 (×2): 2 mg via INTRAVENOUS
  Filled 2016-03-30 (×2): qty 1

## 2016-03-30 MED ORDER — LACTULOSE 10 GM/15ML PO SOLN
20.0000 g | Freq: Two times a day (BID) | ORAL | Status: DC
  Administered 2016-03-31 – 2016-04-02 (×5): 20 g via ORAL
  Filled 2016-03-30 (×5): qty 30

## 2016-03-30 MED ORDER — OXYCODONE HCL 5 MG PO TABS: 5.0000 mg | ORAL_TABLET | ORAL | Status: DC | PRN

## 2016-03-30 MED ORDER — INSULIN ASPART 100 UNIT/ML ~~LOC~~ SOLN: 0.0000 [IU] | Freq: Every day | SUBCUTANEOUS | Status: DC

## 2016-03-30 MED ORDER — DRONABINOL 2.5 MG PO CAPS
2.5000 mg | ORAL_CAPSULE | Freq: Three times a day (TID) | ORAL | Status: DC
  Administered 2016-03-31 – 2016-04-02 (×8): 2.5 mg via ORAL
  Filled 2016-03-30 (×8): qty 1

## 2016-03-30 MED ORDER — CARVEDILOL 3.125 MG PO TABS
3.1250 mg | ORAL_TABLET | Freq: Two times a day (BID) | ORAL | Status: DC
  Administered 2016-03-31 – 2016-04-02 (×5): 3.125 mg via ORAL
  Filled 2016-03-30 (×5): qty 1

## 2016-03-30 MED ORDER — WARFARIN SODIUM 5 MG PO TABS
2.5000 mg | ORAL_TABLET | ORAL | Status: DC
  Administered 2016-04-01: 2.5 mg via ORAL
  Filled 2016-03-30: qty 1

## 2016-03-30 NOTE — Progress Notes (Signed)
ANTICOAGULATION CONSULT NOTE - Initial Consult  Pharmacy Consult for Warfarin Indication: atrial fibrillation  Allergies  Allergen Reactions  . Glimepiride Other (See Comments)    Reaction:  Leg stiffness   . Lipitor [Atorvastatin Calcium] Other (See Comments)    Reaction:  Unknown   . Oxycodone Other (See Comments)    Reaction:  Altered mental status   . Tramadol Other (See Comments)    Reaction:  Hiccups   . Latex Rash  . Tape Rash    Patient Measurements: Height: 5\' 6"  (167.6 cm) Weight: 139 lb 1.6 oz (63.095 kg) IBW/kg (Calculated) : 63.8  Vital Signs: Temp: 98.2 F (36.8 C) (06/23 1834) Temp Source: Oral (06/23 1834) BP: 141/90 mmHg (06/23 1834) Pulse Rate: 84 (06/23 1834)  Labs:  Recent Labs  03/29/16 1126 03/30/16 1537  HGB  --  11.3*  HCT  --  36.0*  PLT  --  201  INR 1.9  --   CREATININE  --  0.43*  TROPONINI  --  <0.03    Estimated Creatinine Clearance: 72.3 mL/min (by C-G formula based on Cr of 0.43).   Medical History: Past Medical History  Diagnosis Date  . Hypertension   . DVT of leg (deep venous thrombosis) ([place]) 2009  . Nephrolithiasis   . Hyperlipidemia   . Allergic rhinitis   . CAD (coronary artery disease)   . Urethral diverticulum   . Varicose vein of leg   . Dupuytren's contracture   . ED (erectile dysfunction)   . History of colonic polyps   . GERD (gastroesophageal reflux disease)   . Diverticulosis of colon   . Myocardial infarction ([place])   . DVT (deep venous thrombosis) ([place])   . Polio 1948    was hospitalized for 1 year  . Diabetes mellitus, type 2 ([place])   . SCC (squamous cell carcinoma)     scalp 2013 per derm  . Pancreatic cancer ([place])   . Femur fracture ([place]) 2017    right, s/p repair    Medications:  Prescriptions prior to admission  Medication Sig Dispense Refill Last Dose  . carvedilol (COREG) 6.25 MG tablet Take 0.5 tablets (3.125 mg total) by mouth 2 (two) times daily with a meal. 90 tablet 3 03/29/2016 at  1000  . cholecalciferol (VITAMIN D) 1000 units tablet Take 4,000 Units by mouth daily.   03/29/2016 at Unknown time  . Digestive Enzymes CAPS Take 1 capsule by mouth daily.   03/29/2016 at Unknown time  . dronabinol (MARINOL) 2.5 MG capsule Take 1 capsule (2.5 mg total) by mouth 3 (three) times daily with meals.   Past Month at Unknown time  . esomeprazole (NEXIUM) 20 MG capsule Take 20 mg by mouth daily as needed (for heartburn).   Past Month at Unknown time  . lactulose (CHRONULAC) 10 GM/15ML solution Take 20 g by mouth daily as needed for mild constipation.   03/30/2016 at Unknown time  . levothyroxine (SYNTHROID, LEVOTHROID) 25 MCG tablet Take 25 mcg by mouth daily before breakfast.   Past Month at Unknown time  . loperamide (IMODIUM) 2 MG capsule Take 2-4 mg by mouth as needed for diarrhea or loose stools.   Past Week at Unknown time  . magnesium oxide (MAGOX 400) 400 (241.3 Mg) MG tablet Take 400 mg by mouth 2 (two) times daily.   03/29/2016 at Unknown time  . metFORMIN (GLUCOPHAGE) 500 MG tablet Take 500-1,000 mg by mouth 2 (two) times daily with a meal. Pt takes two  tablets in the morning and one at night.   03/29/2016 at Unknown time  . nitroGLYCERIN (NITROSTAT) 0.4 MG SL tablet Place 0.4 mg under the tongue every 5 (five) minutes as needed for chest pain.    Past Month at Unknown time  . ondansetron (ZOFRAN) 8 MG tablet Take 8 mg by mouth every 8 (eight) hours as needed for nausea or vomiting.   Past Month at Unknown time  . rosuvastatin (CRESTOR) 5 MG tablet Take 5 mg by mouth daily.   03/29/2016 at Unknown time  . traMADol (ULTRAM) 50 MG tablet Take 50-100 mg by mouth every 4 (four) hours as needed for moderate pain.   03/29/2016 at 2000  . vitamin C (ASCORBIC ACID) 500 MG tablet Take 1,000 mg by mouth daily.   03/29/2016 at Unknown time  . warfarin (COUMADIN) 2.5 MG tablet Take 2.5 mg by mouth every evening. Pt takes this dose on Sunday.   03/25/2016 at [place]   . warfarin (COUMADIN) 5 MG tablet  Take 5 mg by mouth daily. Pt takes this dose Monday - Saturday.   03/29/2016 at 1200   Scheduled:  . [START ON 03/31/2016] carvedilol  3.125 mg Oral BID WC  . [START ON 03/31/2016] dronabinol  2.5 mg Oral TID WC  . insulin aspart  0-5 Units Subcutaneous QHS  . [START ON 03/31/2016] insulin aspart  0-9 Units Subcutaneous TID WC  . lactulose  20 g Oral BID  . [START ON 03/31/2016] levothyroxine  25 mcg Oral QAC breakfast  . magnesium oxide  200 mg Oral Daily  . pantoprazole  40 mg Oral Daily  . warfarin  5 mg Oral Once per day on Mon Tue Wed Thu Fri Sat   And  . [START ON 04/01/2016] warfarin  2.5 mg Oral Q Sun-1800  . [START ON 03/31/2016] Warfarin - Pharmacist Dosing Inpatient   Does not apply q1800   Infusions:  . sodium chloride      Assessment: 75 y/o M with known h/o pancreatic cancer, liver dysfunction, and CAF admitted with altered mental status. Patient on warfarin 5 mg daily except 2.5 mg Sunday PTA with last dose 6/22. INR 6/22 was 1.9  Goal of Therapy:  INR 2-3 Monitor platelets by anticoagulation protocol: Yes   Plan:  Will continue outpatient dosing of warfarin and f/u AM INR.   Ulice Dash D 03/30/2016,6:50 PM

## 2016-03-30 NOTE — H&P (Signed)
[place] [facility] NAME: Ronnie Johnson    MR#:  [id number]  DATE OF BIRTH:  08-31-1941   DATE OF ADMISSION:  03/30/2016  PRIMARY CARE PHYSICIAN: Elsie Stain, MD   REQUESTING/REFERRING PHYSICIAN: McShane  CHIEF COMPLAINT:   Chief Complaint  Patient presents with  . Weakness  . Altered Mental Status    HISTORY OF PRESENT ILLNESS:  Ronnie Johnson  is a 75 y.o. male with a known history of Pancreatic cancer known liver dysfunction atrial fibrillation chronic presenting with altered mental status. Patient unable to provide meaningful information given mental status history obtained from wife present at bedside. Denies further symptomatology other than lethargy and weakness. States no bowel movement approximately 2-3 days.  PAST MEDICAL HISTORY:   Past Medical History  Diagnosis Date  . Hypertension   . DVT of leg (deep venous thrombosis) ([place]) 2009  . Nephrolithiasis   . Hyperlipidemia   . Allergic rhinitis   . CAD (coronary artery disease)   . Urethral diverticulum   . Varicose vein of leg   . Dupuytren's contracture   . ED (erectile dysfunction)   . History of colonic polyps   . GERD (gastroesophageal reflux disease)   . Diverticulosis of colon   . Myocardial infarction ([place])   . DVT (deep venous thrombosis) ([place])   . Polio 1948    was hospitalized for 1 year  . Diabetes mellitus, type 2 ([place])   . SCC (squamous cell carcinoma)     scalp 2013 per derm  . Pancreatic cancer ([place])   . Femur fracture ([place]) 2017    right, s/p repair    PAST SURGICAL HISTORY:   Past Surgical History  Procedure Laterality Date  . Appendectomy  1994  . Umbilical hernia repair  03/14/2005  . 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 Replacement total knee Left 2016  . Peripheral vascular catheterization N/A 06/23/2015    Procedure: [name];  Surgeon: Algernon Huxley, MD;  Location: [facility];  Service:  Cardiovascular;  Laterality: N/A;    SOCIAL HISTORY:   Social History  Substance Use Topics  . Smoking status: Former Smoker    Quit date: 10/08/1988  . Smokeless tobacco: Never Used  . Alcohol Use: 0.0 oz/week    0 Standard drinks or equivalent per week     Comment: RARE    FAMILY HISTORY:   Family History  Problem Relation Age of Onset  . Diabetes Mother   . Hypertension Mother   . Stroke Mother   . Alzheimer's disease Mother   . Heart attack Father   . Hypertension Father   . Prostate cancer Brother   . Diabetes Brother   . Hypertension Brother   . Diabetes Sister   . Colon cancer Neg Hx   . Hypertension Daughter     DRUG ALLERGIES:   Allergies  Allergen Reactions  . Glimepiride Other (See Comments)    Reaction:  Leg stiffness   . Lipitor [Atorvastatin Calcium] Other (See Comments)    Reaction:  Unknown   . Oxycodone Other (See Comments)    Reaction:  Altered mental status   . Tramadol Other (See Comments)    Reaction:  Hiccups   . Latex Rash  . Tape Rash    REVIEW OF SYSTEMS:  Unable to obtain given mental status   MEDICATIONS AT HOME:   Prior to Admission medications  Medication Sig Start Date End Date Taking? Authorizing Provider  levothyroxine (SYNTHROID, LEVOTHROID) 25 MCG tablet Take 25 mcg by mouth daily before breakfast.   Yes Historical Provider, MD  lidocaine-prilocaine (EMLA) cream Apply 1 application topically as needed (prior to accessing port).   Yes Historical Provider, MD  lisinopril (PRINIVIL,ZESTRIL) 10 MG tablet Take 10 mg by mouth daily.   Yes Historical Provider, MD  metFORMIN (GLUCOPHAGE) 500 MG tablet Take 500 mg by mouth 2 (two) times daily with a meal.   Yes Historical Provider, MD  nitroGLYCERIN (NITROSTAT) 0.4 MG SL tablet Place 0.4 mg under the tongue every 5 (five) minutes as needed for chest pain.    Yes Historical Provider, MD  carvedilol (COREG) 6.25 MG tablet Take 0.5 tablets (3.125 mg total) by mouth 2 (two) times  daily with a meal. 01/19/16   Tonia Ghent, MD  dronabinol (MARINOL) 2.5 MG capsule Take 1 capsule (2.5 mg total) by mouth 3 (three) times daily with meals. 01/19/16   Tonia Ghent, MD  esomeprazole (NEXIUM) 20 MG capsule Take 1 capsule (20 mg total) by mouth daily as needed (HEARTBURN). 11/18/15   Tonia Ghent, MD  Lactulose 20 GM/30ML SOLN Take 30 mLs (20 g total) by mouth daily as needed (if constipated or confused). 03/23/16   Tonia Ghent, MD  loperamide (IMODIUM A-D) 2 MG tablet Take 1 tablet (2 mg total) by mouth 3 (three) times daily as needed. Take 2 at diarrhea onset , then 1 every 2hr until 12hrs with no BM. May take 2 every 4hrs at night. If diarrhea recurs repeat. 12/03/15   Lloyd Huger, MD  magnesium 30 MG tablet Take by mouth. ? dosage    Historical Provider, MD  prochlorperazine (COMPAZINE) 10 MG tablet Take 1 tablet (10 mg total) by mouth every 6 (six) hours as needed for nausea or vomiting. 06/21/15   Lloyd Huger, MD  traMADol (ULTRAM) 50 MG tablet Take 1 tablet (50 mg total) by mouth every 12 (twelve) hours as needed (for pain). 03/23/16   Tonia Ghent, MD  warfarin (COUMADIN) 5 MG tablet Take as directed by anticoagulation clinic. 03/23/16   Tonia Ghent, MD      VITAL SIGNS:  Blood pressure 133/90, pulse 115, temperature 98.8 F (37.1 C), temperature source Oral, resp. rate 18, height 5\' 6"  (1.676 m), weight 139 lb 1.6 oz (63.095 kg), SpO2 98 %.  PHYSICAL EXAMINATION:   VITAL SIGNS: Filed Vitals:   03/30/16 1540  BP: 133/90  Pulse: 115  Temp: 98.8 F (37.1 C)  Resp: 18   GENERAL:75 y.o.male moderate distress given mental status.  HEAD: Normocephalic, atraumatic.  EYES: Pupils equal, round, reactive to light. Unable to assess extraocular muscles given mental status/medical condition. No scleral icterus.  MOUTH: Moist mucosal membrane. Dentition intact. No abscess noted.  EAR, NOSE, THROAT: Clear without exudates. No external lesions.  NECK:  Supple. No thyromegaly. No nodules. No JVD.  PULMONARY: Clear to ascultation, without wheeze rails or rhonci. No use of accessory muscles, Good respiratory effort. good air entry bilaterally CHEST: Nontender to palpation.  CARDIOVASCULAR: S1 and S2. Regular rate and rhythm. No murmurs, rubs, or gallops. No edema. Pedal pulses 2+ bilaterally.  GASTROINTESTINAL: Soft, nontender, nondistended. No masses. Positive bowel sounds. No hepatosplenomegaly.  MUSCULOSKELETAL: No swelling, clubbing, or edema. Range of motion full in all extremities. Positive asterixis NEUROLOGIC: Very slow/sluggish Unable to assess given mental status/medical condition SKIN: No ulceration, lesions, rashes, or cyanosis. Skin  warm and dry. Turgor intact.  PSYCHIATRIC: Unable to assess given mental status/medical condition arousable but lethargic     LABORATORY PANEL:   CBC  Recent Labs Lab 03/30/16 1537  WBC 5.8  HGB 11.3*  HCT 36.0*  PLT 201   ------------------------------------------------------------------------------------------------------------------  Chemistries   Recent Labs Lab 03/30/16 1537  NA 136  K 3.7  CL 99*  CO2 29  GLUCOSE 172*  BUN 10  CREATININE 0.43*  CALCIUM 9.1  AST 19  ALT 13*  ALKPHOS 98  BILITOT 0.4   ------------------------------------------------------------------------------------------------------------------  Cardiac Enzymes  Recent Labs Lab 03/30/16 1537  TROPONINI <0.03   ------------------------------------------------------------------------------------------------------------------  RADIOLOGY:  Dg Chest Port 1 View  03/30/2016  CLINICAL DATA:  Altered mental status and weakness, personal history of pancreatic cancer EXAM: PORTABLE CHEST 1 VIEW COMPARISON:  None. FINDINGS: Limited inspiratory effect. Heart size upper normal with normal vascular pattern. Port-A-Cath identified on the right. Opacity at both lower lobes suggests a combination of pleural  effusion and underlying airspace disease which could be due to atelectasis. IMPRESSION: Bilateral pleural effusions with underlying lower lobe consolidation. Electronically Signed   By: Skipper Cliche M.D.   On: 03/30/2016 16:49    EKG:   Orders placed or performed during the hospital encounter of 03/30/16  . ED EKG  . ED EKG    IMPRESSION AND PLAN:   75 year old Caucasian gentleman history of pancreatic cancer with known liver dysfunction percent altered mental status  1. Metabolic encephalopathy: Likely component of hepatic encephalopathy patient still/lethargic did receive lactulose prior to arrival to the hospital given by wife. Still has asterixis continue lactulose bowel movements 2-3 times daily, surgical further etiology, no evidence of infection at this time 2. Chronic atrial fibrillation continue Coreg, warfarin, consult pharmacy for management, check INR 3. Hypothyroidism unspecified Synthroid 4. Type 2 diabetes non-insulin-requiring hold oral agents, check A1c, sliding scale coverage  All the records are reviewed and case discussed with ED provider. Management plans discussed with the patient, family and they are in agreement.  CODE STATUS: Full as discussed with wife at bedside  TOTAL TIME TAKING CARE OF THIS PATIENT: 45 minutes.    Shad Ledvina,  Karenann Cai.D on 03/30/2016 at 5:56 PM  Between 7am to 6pm - Pager - [phone]  After 6pm: House Pager: [phone]  [facility]  Office  [phone]  CC: Primary care physician; Elsie Stain, MD

## 2016-03-30 NOTE — ED Notes (Signed)
Pt comes into the ED via EMS from home c/o weakness and altered mental status.  Patient has h/o pancreatic and liver cancer. Alert and oriented to person and place.  All VS WDL per EMS and running a-fib on EKG.

## 2016-03-30 NOTE — ED Notes (Signed)
CBG 151. 

## 2016-03-30 NOTE — ED Provider Notes (Addendum)
[facility] Emergency Department Provider Note  ____________________________________________   I have reviewed the triage vital signs and the nursing notes.   HISTORY  Chief Complaint Weakness and Altered Mental Status    HPI Ronnie Johnson is a 75 y.o. male presents today complaining of lethargy. Most of the history is per family. He will wake up and talk to me he knows he is in the hospital and his name. Unfortunately patient suffers from end-stage pancreatic cancer, who is no longer getting chemotherapy or radiation. He has a power of attorney and according to his daughter is comfortable with the aspect of his death. The patient hasa history of elevated ammonia levels and has acted exactly like as well as ammonia levels are high. Apparently, according to family, he was taken out of "early" from the rehabilitation facility where he was staying as the patient's wife was not content with the care there. The patient and his wife apparently do not wish to take lactulose because of the diarrheal issues and a common cause with the patient and over the last 3 or 4 days she's become more and more sedated exactly like he did when his ammonia levels were elevated. The patient has not been taking any lactulose therefore for more than 2 weeks. His last bowel movement was Tuesday. He himself has no complaints aside from his chronic pain in his abdomen which usually magical with tramadol, there is been no change in or increase in dosing or frequency of the patient's pain medications. He's had no fever or vomiting.    Past Medical History  Diagnosis Date  . Hypertension   . DVT of leg (deep venous thrombosis) ([address]) 2009  . Nephrolithiasis   . Hyperlipidemia   . Allergic rhinitis   . CAD (coronary artery disease)   . Urethral diverticulum   . Varicose vein of leg   . Dupuytren's contracture   . ED (erectile dysfunction)   . History of colonic polyps   . GERD  (gastroesophageal reflux disease)   . Diverticulosis of colon   . Myocardial infarction ([place])   . DVT (deep venous thrombosis) ([place])   . Polio 1948    was hospitalized for 1 year  . Diabetes mellitus, type 2 ([place])   . SCC (squamous cell carcinoma)     scalp 2013 per derm  . Pancreatic cancer ([place])   . Femur fracture ([place]) 2017    right, s/p repair    Patient Active Problem List   Diagnosis Date Noted  . Insomnia 03/25/2016  . Long term current use of anticoagulant therapy 03/25/2016  . Atypical chest pain 10/13/2015  . Chest wall pain 10/13/2015  . Pancreatic cancer ([place]) 09/16/2015  . Myalgia and myositis 09/06/2015  . Pancreatic cancer metastasized to liver ([place]) 06/15/2015  . Arteriosclerosis of coronary artery 06/09/2015  . Deep vein thrombosis (DVT) ([place]) 06/09/2015  . Essential (primary) hypertension 06/09/2015  . Pancreatic mass 06/09/2015  . Abdominal pain 05/31/2015  . Advance care planning 11/10/2014  . S/P knee replacement 11/10/2014  . Dysuria 07/04/2014  . Renal stone 02/05/2014  . Preop cardiovascular exam 01/23/2013  . Medicare annual wellness visit, subsequent 09/10/2012  . Rash 09/10/2012  . Oral aphthous ulcer 09/10/2012  . Shoulder pain 04/28/2012  . SCC (squamous cell carcinoma) 12/31/2011  . Scrotal varices, superficial 01/05/2011  . MURMUR 09/12/2010  . Diabetes mellitus type 2, uncontrolled, without complications ([place]) 26/83/4196  . UNSPECIFIED VITAMIN D DEFICIENCY 09/07/2009  . Coronary atherosclerosis  01/31/2009  . Obesity 08/23/2008  . URETHRAL DIVERTICULUM 05/28/2008  . VARICOSE VEINS LOWER EXTREMITIES W/OTH COMPS 03/02/2008  . HEMATURIA 06/30/2007  . HLD (hyperlipidemia) 05/21/2007  . ERECTILE DYSFUNCTION 05/21/2007  . Essential hypertension 05/21/2007  . ALLERGIC RHINITIS, SEASONAL 05/21/2007  . GERD 05/21/2007  . DIVERTICULOSIS, COLON 05/21/2007  . DUPUYTREN'S CONTRACTURE, LEFT 05/21/2007  . HX, PERSONAL, COLONIC POLYPS 05/21/2007   . ADVEF, DRUG/MEDICINAL/BIOLOGICAL SUBST NOS 04/02/2007    Past Surgical History  Procedure Laterality Date  . Appendectomy  1994  . Umbilical hernia repair  03/14/2005  . 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 Replacement total knee Left 2016  . Peripheral vascular catheterization N/A 06/23/2015    Procedure: [name];  Surgeon: Algernon Huxley, MD;  Location: [facility];  Service: Cardiovascular;  Laterality: N/A;    Current Outpatient Rx  Name  Route  Sig  Dispense  Refill  . Blood Glucose Monitoring Suppl (ONE TOUCH ULTRA SYSTEM KIT) W/DEVICE KIT      Check blood sugar once daily and as directed. Dx E11.65   1 each   0   . carvedilol (COREG) 6.25 MG tablet   Oral   Take 0.5 tablets (3.125 mg total) by mouth 2 (two) times daily with a meal.   90 tablet   3   . dronabinol (MARINOL) 2.5 MG capsule   Oral   Take 1 capsule (2.5 mg total) by mouth 3 (three) times daily with meals.         Marland Kitchen esomeprazole (NEXIUM) 20 MG capsule   Oral   Take 1 capsule (20 mg total) by mouth daily as needed (HEARTBURN).   90 capsule   3   . Lactulose 20 GM/30ML SOLN   Oral   Take 30 mLs (20 g total) by mouth daily as needed (if constipated or confused).   360 mL   1   . levothyroxine (SYNTHROID, LEVOTHROID) 25 MCG tablet   Oral   Take 25 mcg by mouth daily before breakfast.         . lidocaine-prilocaine (EMLA) cream   Topical   Apply 1 application topically as needed. Apply to port 1 hour prior to chemotherapy appointment, cover with plastic wrap.   30 g   2   . loperamide (IMODIUM A-D) 2 MG tablet   Oral   Take 1 tablet (2 mg total) by mouth 3 (three) times daily as needed. Take 2 at diarrhea onset , then 1 every 2hr until 12hrs with no BM. May take 2 every 4hrs at night. If diarrhea recurs repeat.   100 tablet   1   . magnesium 30 MG tablet   Oral   Take by mouth. ? dosage         . metFORMIN (GLUCOPHAGE) 500 MG tablet      Take 2  tablets each morning and 1 tablet each evening.         . nitroGLYCERIN (NITROSTAT) 0.4 MG SL tablet   Sublingual   Place under the tongue.         . ONE TOUCH ULTRA TEST test strip      USE DAILY AS INSTRUCTED. DX E11.9   100 each   2   . prochlorperazine (COMPAZINE) 10 MG tablet   Oral   Take 1 tablet (10 mg total) by mouth every 6 (six) hours as needed for nausea or vomiting.   30 tablet  2   . traMADol (ULTRAM) 50 MG tablet   Oral   Take 1 tablet (50 mg total) by mouth every 12 (twelve) hours as needed (for pain).   30 tablet   1   . warfarin (COUMADIN) 5 MG tablet      Take as directed by anticoagulation clinic.   40 tablet   3     30 day supply     Allergies Glimepiride; Lipitor; Oxycodone; Tramadol; Latex; and Tape  Family History  Problem Relation Age of Onset  . Diabetes Mother   . Hypertension Mother   . Stroke Mother   . Alzheimer's disease Mother   . Heart attack Father   . Hypertension Father   . Prostate cancer Brother   . Diabetes Brother   . Hypertension Brother   . Diabetes Sister   . Colon cancer Neg Hx   . Hypertension Daughter     Social History Social History  Substance Use Topics  . Smoking status: Former Smoker    Quit date: 10/08/1988  . Smokeless tobacco: Never Used  . Alcohol Use: 0.0 oz/week    0 Standard drinks or equivalent per week     Comment: RARE    Review of Systems Constitutional: No fever/chills Eyes: No visual changes. ENT: No sore throat. No stiff neck no neck pain Cardiovascular: Denies chest pain. Respiratory: Denies shortness of breath. Gastrointestinal:   no vomiting.  No diarrhea.  No constipation. Genitourinary: Negative for dysuria. Musculoskeletal: Negative lower extremity swelling Skin: Negative for rash. Neurological: Negative for headaches, focal weakness or numbness. 10-point ROS otherwise negative.  ____________________________________________   PHYSICAL EXAM:  VITAL SIGNS: ED  Triage Vitals  Enc Vitals Group     BP 03/30/16 1540 133/90 mmHg     Pulse Rate 03/30/16 1540 115     Resp 03/30/16 1540 18     Temp 03/30/16 1540 98.8 F (37.1 C)     Temp Source 03/30/16 1540 Oral     SpO2 03/30/16 1540 98 %     Weight 03/30/16 1540 139 lb 1.6 oz (63.095 kg)     Height 03/30/16 1540 5' 6"  (1.676 m)     Head Cir --      Peak Flow --      Pain Score 03/30/16 1604 5     Pain Loc --      Pain Edu? --      Excl. in [place]? --     Constitutional: Somnolent but arousable, sitting up and then will talk to me. Somewhat currently her period but in no acute distress. Eyes: Conjunctivae are normal. PERRL. EOMI. Head: Atraumatic. Nose: No congestion/rhinnorhea. Mouth/Throat: Mucous membranes are moist.  Oropharynx non-erythematous. Neck: No stridor.   Nontender with no meningismus Cardiovascular: Mild tachycardia noted with a occasional premature beat. Grossly normal heart sounds.  Good peripheral circulation. Respiratory: Normal respiratory effort.  No retractions. Lungs CTAB. Abdominal: Soft with epigastric tenderness noted. No distention. No guarding no rebound Back:  There is no focal tenderness or step off there is no midline tenderness there are no lesions noted. there is no CVA tenderness Musculoskeletal: No lower extremity tenderness. No joint effusions, no DVT signs strong distal pulses no edema Neurologic:  Normal speech and language. No gross focal neurologic deficits are appreciated.  Skin:  Skin is warm, dry and intact. No rash noted. Psychiatric: Mood and affect are normal. Speech and behavior are normal.  ____________________________________________   LABS (all labs ordered are listed, but only  abnormal results are displayed)  Labs Reviewed  GLUCOSE, CAPILLARY - Abnormal; Notable for the following:    Glucose-Capillary 151 (*)    All other components within normal limits  BASIC METABOLIC PANEL - Abnormal; Notable for the following:    Chloride 99 (*)     Glucose, Bld 172 (*)    Creatinine, Ser 0.43 (*)    All other components within normal limits  CBC - Abnormal; Notable for the following:    Hemoglobin 11.3 (*)    HCT 36.0 (*)    MCV 74.5 (*)    MCH 23.4 (*)    MCHC 31.4 (*)    RDW 17.8 (*)    All other components within normal limits  URINALYSIS COMPLETEWITH MICROSCOPIC ([facility] ONLY)  AMMONIA  LACTIC ACID, PLASMA  LACTIC ACID, PLASMA  TROPONIN I  HEPATIC FUNCTION PANEL  CBG MONITORING, ED   ____________________________________________  EKG  I personally interpreted any EKGs ordered by me or triage Sinus tach most likely with sinus dysrhythmia rate 129 acute ischemic changes ____________________________________________  RADIOLOGY  I reviewed any imaging ordered by me or triage that were performed during my shift and, if possible, patient and/or family made aware of any abnormal findings. ____________________________________________   PROCEDURES  Procedure(s) performed: None  Critical Care performed: None  ____________________________________________   INITIAL IMPRESSION / ASSESSMENT AND PLAN / ED COURSE  Pertinent labs & imaging results that were available during my care of the patient were reviewed by me and considered in my medical decision making (see chart for details).  Very sick at baseline gel and presents today with a few days of gradually worsening level of alertness which is consistent with prior hyperammonemia,  We will institute a broad workup, I will give him empiric lactulose if he passes a bedside swallow study, we will reassess.  ----------------------------------------- 4:57 PM on 03/30/2016 -----------------------------------------  Family does reveal that they did give him some lactulose this morning, this could result in the discordance possibly between his levels in his alertness although again this is not usually a linear relationship, his ammonia level at this time she'll is reassuring. He  is lactic acid is negative, and his blood work is largely reassuring. Certainly there are multiple different possible causes of the patient's gradual onset somnolence including combination of drug and metabolic pathologies. We will discuss with the hospitalist service. Family also notes that they did give him Ambien at around midnight or 11 last night.  ____________________________________________   FINAL CLINICAL IMPRESSION(S) / ED DIAGNOSES  Final diagnoses:  Weakness      This chart was dictated using voice recognition software.  Despite best efforts to proofread,  errors can occur which can change meaning.     Schuyler Amor, MD 03/30/16 [id number]  Schuyler Amor, MD 03/30/16 [place], MD 03/30/16 [id number]

## 2016-03-30 NOTE — Telephone Encounter (Signed)
Mrs Kohlbeck left v/m to [facility] CMA that pt has slept most of afternoon and she is going to call 911 and take pt to ED; per chart review tab pt is at [facility] ED.

## 2016-03-30 NOTE — Progress Notes (Signed)
Palliative Care Update  I noticed this consult placed in the evening of 03/30/16.  I will not be able to see this consult on this date, despite staying late to finish up seeing other patients.  There will not be Palliative Care coverage until 04/04/15, when a nurse practitioner starts here in that role.    Colleen Can, MD

## 2016-03-31 DIAGNOSIS — Z885 Allergy status to narcotic agent status: Secondary | ICD-10-CM | POA: Diagnosis not present

## 2016-03-31 DIAGNOSIS — Z91040 Latex allergy status: Secondary | ICD-10-CM | POA: Diagnosis not present

## 2016-03-31 DIAGNOSIS — Z87891 Personal history of nicotine dependence: Secondary | ICD-10-CM | POA: Diagnosis not present

## 2016-03-31 DIAGNOSIS — Z8249 Family history of ischemic heart disease and other diseases of the circulatory system: Secondary | ICD-10-CM | POA: Diagnosis not present

## 2016-03-31 DIAGNOSIS — I10 Essential (primary) hypertension: Secondary | ICD-10-CM | POA: Diagnosis present

## 2016-03-31 DIAGNOSIS — K7290 Hepatic failure, unspecified without coma: Secondary | ICD-10-CM | POA: Diagnosis present

## 2016-03-31 DIAGNOSIS — Z96652 Presence of left artificial knee joint: Secondary | ICD-10-CM | POA: Diagnosis present

## 2016-03-31 DIAGNOSIS — Z9049 Acquired absence of other specified parts of digestive tract: Secondary | ICD-10-CM | POA: Diagnosis not present

## 2016-03-31 DIAGNOSIS — R278 Other lack of coordination: Secondary | ICD-10-CM | POA: Diagnosis present

## 2016-03-31 DIAGNOSIS — Z87442 Personal history of urinary calculi: Secondary | ICD-10-CM | POA: Diagnosis not present

## 2016-03-31 DIAGNOSIS — C259 Malignant neoplasm of pancreas, unspecified: Secondary | ICD-10-CM | POA: Diagnosis present

## 2016-03-31 DIAGNOSIS — Z86718 Personal history of other venous thrombosis and embolism: Secondary | ICD-10-CM | POA: Diagnosis not present

## 2016-03-31 DIAGNOSIS — R531 Weakness: Secondary | ICD-10-CM | POA: Diagnosis present

## 2016-03-31 DIAGNOSIS — I482 Chronic atrial fibrillation: Secondary | ICD-10-CM | POA: Diagnosis present

## 2016-03-31 DIAGNOSIS — Z8042 Family history of malignant neoplasm of prostate: Secondary | ICD-10-CM | POA: Diagnosis not present

## 2016-03-31 DIAGNOSIS — I2510 Atherosclerotic heart disease of native coronary artery without angina pectoris: Secondary | ICD-10-CM | POA: Diagnosis present

## 2016-03-31 DIAGNOSIS — Z888 Allergy status to other drugs, medicaments and biological substances status: Secondary | ICD-10-CM | POA: Diagnosis not present

## 2016-03-31 DIAGNOSIS — K7689 Other specified diseases of liver: Secondary | ICD-10-CM | POA: Diagnosis present

## 2016-03-31 DIAGNOSIS — E119 Type 2 diabetes mellitus without complications: Secondary | ICD-10-CM | POA: Diagnosis present

## 2016-03-31 DIAGNOSIS — Z833 Family history of diabetes mellitus: Secondary | ICD-10-CM | POA: Diagnosis not present

## 2016-03-31 DIAGNOSIS — K7682 Hepatic encephalopathy: Secondary | ICD-10-CM | POA: Diagnosis present

## 2016-03-31 DIAGNOSIS — E039 Hypothyroidism, unspecified: Secondary | ICD-10-CM | POA: Diagnosis present

## 2016-03-31 DIAGNOSIS — Z8507 Personal history of malignant neoplasm of pancreas: Secondary | ICD-10-CM | POA: Diagnosis not present

## 2016-03-31 DIAGNOSIS — G9341 Metabolic encephalopathy: Secondary | ICD-10-CM | POA: Diagnosis present

## 2016-03-31 DIAGNOSIS — Z7901 Long term (current) use of anticoagulants: Secondary | ICD-10-CM | POA: Diagnosis not present

## 2016-03-31 DIAGNOSIS — C787 Secondary malignant neoplasm of liver and intrahepatic bile duct: Secondary | ICD-10-CM | POA: Diagnosis present

## 2016-03-31 DIAGNOSIS — Z820 Family history of epilepsy and other diseases of the nervous system: Secondary | ICD-10-CM | POA: Diagnosis not present

## 2016-03-31 DIAGNOSIS — G8929 Other chronic pain: Secondary | ICD-10-CM | POA: Diagnosis present

## 2016-03-31 DIAGNOSIS — Z79899 Other long term (current) drug therapy: Secondary | ICD-10-CM | POA: Diagnosis not present

## 2016-03-31 DIAGNOSIS — K219 Gastro-esophageal reflux disease without esophagitis: Secondary | ICD-10-CM | POA: Diagnosis present

## 2016-03-31 DIAGNOSIS — Z66 Do not resuscitate: Secondary | ICD-10-CM | POA: Diagnosis present

## 2016-03-31 DIAGNOSIS — Z823 Family history of stroke: Secondary | ICD-10-CM | POA: Diagnosis not present

## 2016-03-31 LAB — GLUCOSE, CAPILLARY
GLUCOSE-CAPILLARY: 121 mg/dL — AB (ref 65–99)
Glucose-Capillary: 119 mg/dL — ABNORMAL HIGH (ref 65–99)
Glucose-Capillary: 112 mg/dL — ABNORMAL HIGH (ref 65–99)
Glucose-Capillary: 128 mg/dL — ABNORMAL HIGH (ref 65–99)

## 2016-03-31 LAB — PROTIME-INR
INR: 2.24
PROTHROMBIN TIME: 24.6 s — AB (ref 11.4–15.0)

## 2016-03-31 LAB — HEMOGLOBIN A1C: Hgb A1c MFr Bld: 7.6 % — ABNORMAL HIGH (ref 4.0–6.0)

## 2016-03-31 MED ORDER — TRAMADOL HCL 50 MG PO TABS
50.0000 mg | ORAL_TABLET | Freq: Four times a day (QID) | ORAL | Status: DC | PRN
  Administered 2016-03-31 – 2016-04-01 (×3): 50 mg via ORAL
  Filled 2016-03-31 (×3): qty 1

## 2016-03-31 MED ORDER — LORAZEPAM 2 MG/ML IJ SOLN
0.5000 mg | Freq: Once | INTRAMUSCULAR | Status: AC
  Administered 2016-03-31: 06:00:00 0.5 mg via INTRAVENOUS
  Filled 2016-03-31: qty 1

## 2016-03-31 NOTE — Progress Notes (Signed)
ANTICOAGULATION CONSULT NOTE - FOLLOW UP   Pharmacy Consult for Warfarin Indication: atrial fibrillation  Allergies  Allergen Reactions  . Glimepiride Other (See Comments)    Reaction:  Leg stiffness   . Lipitor [Atorvastatin Calcium] Other (See Comments)    Reaction:  Unknown   . Oxycodone Other (See Comments)    Reaction:  Altered mental status   . Tramadol Other (See Comments)    Reaction:  Hiccups   . Latex Rash  . Tape Rash    Patient Measurements: Height: 5\' 6"  (167.6 cm) Weight: 139 lb 1.6 oz (63.095 kg) IBW/kg (Calculated) : 63.8  Vital Signs: Temp: 98.9 F (37.2 C) (06/24 0546) Temp Source: Oral (06/24 0546) BP: 147/74 mmHg (06/24 0854) Pulse Rate: 92 (06/24 0854)  Labs:  Recent Labs  03/29/16 1126 03/30/16 1537 03/30/16 1903 03/31/16 1039  HGB  --  11.3* 10.8*  --   HCT  --  36.0* 34.2*  --   PLT  --  201 187  --   LABPROT  --   --  23.8* 24.6*  INR 1.9  --  2.15 2.24  CREATININE  --  0.43* 0.34*  --   TROPONINI  --  <0.03  --   --     Estimated Creatinine Clearance: 72.3 mL/min (by C-G formula based on Cr of 0.34).   Medical History: Past Medical History  Diagnosis Date  . Hypertension   . DVT of leg (deep venous thrombosis) ([place]) 2009  . Nephrolithiasis   . Hyperlipidemia   . Allergic rhinitis   . CAD (coronary artery disease)   . Urethral diverticulum   . Varicose vein of leg   . Dupuytren's contracture   . ED (erectile dysfunction)   . History of colonic polyps   . GERD (gastroesophageal reflux disease)   . Diverticulosis of colon   . Myocardial infarction ([place])   . DVT (deep venous thrombosis) ([place])   . Polio 1948    was hospitalized for 1 year  . Diabetes mellitus, type 2 ([place])   . SCC (squamous cell carcinoma)     scalp 2013 per derm  . Pancreatic cancer ([place])   . Femur fracture ([place]) 2017    right, s/p repair    Medications:  Prescriptions prior to admission  Medication Sig Dispense Refill Last Dose  . carvedilol  (COREG) 6.25 MG tablet Take 0.5 tablets (3.125 mg total) by mouth 2 (two) times daily with a meal. 90 tablet 3 03/29/2016 at 1000  . cholecalciferol (VITAMIN D) 1000 units tablet Take 4,000 Units by mouth daily.   03/29/2016 at Unknown time  . Digestive Enzymes CAPS Take 1 capsule by mouth daily.   03/29/2016 at Unknown time  . dronabinol (MARINOL) 2.5 MG capsule Take 1 capsule (2.5 mg total) by mouth 3 (three) times daily with meals.   Past Month at Unknown time  . esomeprazole (NEXIUM) 20 MG capsule Take 20 mg by mouth daily as needed (for heartburn).   Past Month at Unknown time  . lactulose (CHRONULAC) 10 GM/15ML solution Take 20 g by mouth daily as needed for mild constipation.   03/30/2016 at Unknown time  . levothyroxine (SYNTHROID, LEVOTHROID) 25 MCG tablet Take 25 mcg by mouth daily before breakfast.   Past Month at Unknown time  . loperamide (IMODIUM) 2 MG capsule Take 2-4 mg by mouth as needed for diarrhea or loose stools.   Past Week at Unknown time  . magnesium oxide (MAGOX 400) 400 (241.3 Mg)  MG tablet Take 400 mg by mouth 2 (two) times daily.   03/29/2016 at Unknown time  . metFORMIN (GLUCOPHAGE) 500 MG tablet Take 500-1,000 mg by mouth 2 (two) times daily with a meal. Pt takes two tablets in the morning and one at night.   03/29/2016 at Unknown time  . nitroGLYCERIN (NITROSTAT) 0.4 MG SL tablet Place 0.4 mg under the tongue every 5 (five) minutes as needed for chest pain.    Past Month at Unknown time  . ondansetron (ZOFRAN) 8 MG tablet Take 8 mg by mouth every 8 (eight) hours as needed for nausea or vomiting.   Past Month at Unknown time  . rosuvastatin (CRESTOR) 5 MG tablet Take 5 mg by mouth daily.   03/29/2016 at Unknown time  . traMADol (ULTRAM) 50 MG tablet Take 50-100 mg by mouth every 4 (four) hours as needed for moderate pain.   03/29/2016 at 2000  . vitamin C (ASCORBIC ACID) 500 MG tablet Take 1,000 mg by mouth daily.   03/29/2016 at Unknown time  . warfarin (COUMADIN) 2.5 MG tablet  Take 2.5 mg by mouth every evening. Pt takes this dose on Sunday.   03/25/2016 at [place]   . warfarin (COUMADIN) 5 MG tablet Take 5 mg by mouth daily. Pt takes this dose Monday - Saturday.   03/29/2016 at 1200   Scheduled:  . carvedilol  3.125 mg Oral BID WC  . dronabinol  2.5 mg Oral TID WC  . insulin aspart  0-5 Units Subcutaneous QHS  . insulin aspart  0-9 Units Subcutaneous TID WC  . lactulose  20 g Oral BID  . levothyroxine  25 mcg Oral QAC breakfast  . magnesium oxide  200 mg Oral Daily  . pantoprazole  40 mg Oral Daily  . warfarin  5 mg Oral Once per day on Mon Tue Wed Thu Fri Sat   And  . [START ON 04/01/2016] warfarin  2.5 mg Oral Q Sun-1800  . Warfarin - Pharmacist Dosing Inpatient   Does not apply q1800   Infusions:  . sodium chloride 75 mL/hr at 03/31/16 [id number]    Assessment: 75 y/o M with known h/o pancreatic cancer, liver dysfunction, and CAF admitted with altered mental status. Patient on warfarin 5 mg daily except 2.5 mg Sunday PTA with last dose 6/22. INR 6/22 was 1.9  6/24: INR: 2.24   Goal of Therapy:  INR 2-3 Monitor platelets by anticoagulation protocol: Yes   Plan:  Will continue outpatient dosing of warfarin 5 mg and f/u AM INR. Patient did not receive 5 mg dose on 6/23 per MAR.   Zandyr Barnhill D 03/31/2016,12:17 PM

## 2016-03-31 NOTE — Progress Notes (Signed)
[facility] [place] NAME: Ronnie Johnson    MR#:  [id number]  DATE OF BIRTH:  03/08/1941  SUBJECTIVE:   Pt. Due to altered mental status/encephalopathy. Patient's family is at bedside. Patient remains confused and has hallucinations.  REVIEW OF SYSTEMS:    Review of Systems  Unable to perform ROS: mental acuity    Nutrition: Heart Healthy Tolerating Diet: Yes Tolerating PT: Await Eval.   DRUG ALLERGIES:   Allergies  Allergen Reactions  . Glimepiride Other (See Comments)    Reaction:  Leg stiffness   . Lipitor [Atorvastatin Calcium] Other (See Comments)    Reaction:  Unknown   . Oxycodone Other (See Comments)    Reaction:  Altered mental status   . Tramadol Other (See Comments)    Reaction:  Hiccups   . Latex Rash  . Tape Rash    VITALS:  Blood pressure 147/74, pulse 92, temperature 98.9 F (37.2 C), temperature source Oral, resp. rate 18, height 5\' 6"  (1.676 m), weight 63.095 kg (139 lb 1.6 oz), SpO2 99 %.  PHYSICAL EXAMINATION:   Physical Exam  GENERAL:  75 y.o.-year-old patient lying in the bed Lethargic/encephalopathic.   EYES: Pupils equal, round, reactive to light and accommodation. No scleral icterus. Extraocular muscles intact.  HEENT: Head atraumatic, normocephalic. Oropharynx and nasopharynx clear.  NECK:  Supple, no jugular venous distention. No thyroid enlargement, no tenderness.  LUNGS: Normal breath sounds bilaterally, no wheezing, rales, rhonchi. No use of accessory muscles of respiration.  CARDIOVASCULAR: S1, S2 normal. No murmurs, rubs, or gallops.  ABDOMEN: Soft, nontender, nondistended. Bowel sounds present. No organomegaly or mass.  EXTREMITIES: No cyanosis, clubbing or edema b/l.    NEUROLOGIC: Cranial nerves II through XII are intact. No focal Motor or sensory deficits b/l. Globally weak. PSYCHIATRIC: The patient is alert and oriented x 1.  SKIN: No obvious rash, lesion, or ulcer.    LABORATORY  PANEL:   CBC  Recent Labs Lab 03/30/16 1903  WBC 5.2  HGB 10.8*  HCT 34.2*  PLT 187   ------------------------------------------------------------------------------------------------------------------  Chemistries   Recent Labs Lab 03/30/16 1903  NA 137  K 3.4*  CL 101  CO2 28  GLUCOSE 142*  BUN 9  CREATININE 0.34*  CALCIUM 8.7*  AST 18  ALT 12*  ALKPHOS 98  BILITOT 0.9   ------------------------------------------------------------------------------------------------------------------  Cardiac Enzymes  Recent Labs Lab 03/30/16 1537  TROPONINI <0.03   ------------------------------------------------------------------------------------------------------------------  RADIOLOGY:  Dg Chest Port 1 View  03/30/2016  CLINICAL DATA:  Altered mental status and weakness, personal history of pancreatic cancer EXAM: PORTABLE CHEST 1 VIEW COMPARISON:  None. FINDINGS: Limited inspiratory effect. Heart size upper normal with normal vascular pattern. Port-A-Cath identified on the right. Opacity at both lower lobes suggests a combination of pleural effusion and underlying airspace disease which could be due to atelectasis. IMPRESSION: Bilateral pleural effusions with underlying lower lobe consolidation. Electronically Signed   By: Skipper Cliche M.D.   On: 03/30/2016 16:49     ASSESSMENT AND PLAN:   75 year old male with past medical history of stage IV pancreatic cancer, diabetes, hypertension, GERD, history of DVT, history of femur fracture, hyperlipidemia who presented to the hospital due to altered mental status.  1. Altered mental status-this is metabolic encephalopathy etiology unclear though. Suspected to be hepatic encephalopathy. -Patient still has confusion/delusions and hallucinations. I will DC narcotics, benzodiazepines. -Continue lactulose and titrate 2-3 loose bowel movements per day. -Follow mental status.  2. Hepatic encephalopathy-secondary  to stage IV  pancreatic cancer with liver metastases. -Continue lactulose to titrate to 23 loose bowel movements per day.  3. GERD-continue Protonix.  4. Diabetes type 2 without complication-continue sliding scale insulin.  5. Essential hypertension-continue Coreg.  6. Hypothyroidism-continue Synthroid.  7. Pancreatic cancer stage IV-patient follows up at [facility]. Prognosis is poor. Patient has a follow-up with his oncologist next week on Thursday. -Continue tramadol for pain. I had a long discussion about patient's goals of care with the patient's wife and family.  Patient is a DO NOT RESUSCITATE for now.  8. History of previous DVT-continue warfarin. INR therapeutic.   All the records are reviewed and case discussed with Care Management/Social Workerr. Management plans discussed with the patient, family and they are in agreement.  CODE STATUS: DO NOT RESUSCITATE  DVT Prophylaxis: Warfarin  TOTAL TIME TAKING CARE OF THIS PATIENT: 45 minutes.   POSSIBLE D/C IN 2-3 DAYS, DEPENDING ON CLINICAL CONDITION.  Greater than 50% of time spent in discussing plan of care with the patient's family.   Henreitta Leber M.D on 03/31/2016 at 1:16 PM  Between 7am to 6pm - Pager - [phone]  After 6pm go to [email or web address] - password [email or web address] [place] [facility]  Office  [phone]  CC: Primary care physician; Elsie Stain, MD

## 2016-03-31 NOTE — Plan of Care (Signed)
Problem: Education: Goal: Knowledge of [facility] Education information/materials will improve Outcome: Not Progressing Pt confused   

## 2016-03-31 NOTE — Plan of Care (Signed)
Problem: Education: Goal: Knowledge of [facility] Education information/materials will improve Outcome: Not Progressing Pt confused. Alert to self. Calm throughout the shift.  Problem: Pain Managment: Goal: General experience of comfort will improve Outcome: Progressing Tramadol given once for abd pain with improvement.

## 2016-04-01 LAB — PROTIME-INR
INR: 2.6
PROTHROMBIN TIME: 27.5 s — AB (ref 11.4–15.0)

## 2016-04-01 LAB — GLUCOSE, CAPILLARY
GLUCOSE-CAPILLARY: 127 mg/dL — AB (ref 65–99)
GLUCOSE-CAPILLARY: 130 mg/dL — AB (ref 65–99)
Glucose-Capillary: 97 mg/dL (ref 65–99)
Glucose-Capillary: 151 mg/dL — ABNORMAL HIGH (ref 65–99)

## 2016-04-01 MED ORDER — TRAMADOL HCL 50 MG PO TABS
50.0000 mg | ORAL_TABLET | Freq: Once | ORAL | Status: AC
  Administered 2016-04-01: 50 mg via ORAL
  Filled 2016-04-01: qty 1

## 2016-04-01 MED ORDER — ENSURE ENLIVE PO LIQD
237.0000 mL | Freq: Two times a day (BID) | ORAL | Status: DC
  Administered 2016-04-01: 237 mL via ORAL

## 2016-04-01 NOTE — Telephone Encounter (Signed)
Will await ER notes.  Thanks.

## 2016-04-01 NOTE — Evaluation (Signed)
Physical Therapy Evaluation Patient Details Name: Ronnie Johnson MRN: [id number] DOB: 12-Jul-1941 Today's Date: 04/01/2016   History of Present Illness  75 y/o male with recent R femur fx and was at rehab for about a week.  He was making some improvements and then had significant change in mental status with a lot of confusion.  Clinical Impression  Pt is confused t/o the session and has moments of flat affect and at times speaks off topic but with animation.  Family is helpful in trying to keep pt on task. Pt initially very unstable with standing (losing balance backwards) but overall was able to gradually improve safety awareness/balance and was able to do some limited walking.  Pt's mental status is his biggest limiter at this time, unsure if he would have shown more overall strength if he could follow testing instructions better.  Pt's family very much wanting to take him home after hospitalization - this should be possible if mentally he returns to baseline.    Follow Up Recommendations Home health PT (if pt's mental status improves)    Equipment Recommendations       Recommendations for Other Services       Precautions / Restrictions Precautions Precautions: Fall Restrictions Weight Bearing Restrictions: No      Mobility  Bed Mobility Overal bed mobility: Needs Assistance Bed Mobility: Supine to Sit     Supine to sit: Min assist;Mod assist     General bed mobility comments: Pt able to shows effort with verbal and direct cuing but does need assist to get to and stay balanced in sitting  Transfers Overall transfer level: Needs assistance Equipment used: Rolling walker (2 wheeled) Transfers: Sit to/from Stand Sit to Stand: Min assist         General transfer comment: Pt again needing a lot of cuing and assist with set up/sequencing.  He was able show good effort getting to standing but needed assist to keep weight forward and generally to maintain balance and stay  focused on the task at hand  Ambulation/Gait Ambulation/Gait assistance: Min assist Ambulation Distance (Feet): 35 Feet Assistive device: Rolling walker (2 wheeled)       General Gait Details: Pt initially unable to keep weight forward and even balance with stepping, but with direct phyiscal cues and instruction he does improve and is able to walk to the door and back. Pt reliant on the walker and needing directional and safety cues secondary to AMS.  Stairs            Wheelchair Mobility    Modified Rankin (Stroke Patients Only)       Balance Overall balance assessment: Needs assistance Sitting-balance support: Bilateral upper extremity supported Sitting balance-Leahy Scale: Fair     Standing balance support: Bilateral upper extremity supported Standing balance-Leahy Scale: Fair                               Pertinent Vitals/Pain Pain Assessment: No/denies pain    Home Living Family/patient expects to be discharged to:: Private residence Living Arrangements: Spouse/significant other Available Help at Discharge: Family   Home Access: Stairs to enter Entrance Stairs-Rails: Psychiatric nurse of Steps: 3          Prior Function Level of Independence: Independent with assistive device(s)         Comments: Pt has typically been able to bathe, dress, use restroom, etc     Hand Dominance  Extremity/Trunk Assessment   Upper Extremity Assessment: Generalized weakness (grossly 3+/5, R shoulder more limited 2/2 RTC injury)           Lower Extremity Assessment: Generalized weakness (R grossly 3+/5, L grossly 4-/5)         Communication   Communication:  (minimally communicative secondary to AMS)  Cognition Arousal/Alertness:  (confused, difficult to keep on task) Behavior During Therapy: Impulsive Overall Cognitive Status: Impaired/Different from baseline (apparently he is much, much better than yesterday)                       General Comments      Exercises        Assessment/Plan    PT Assessment Patient needs continued PT services  PT Diagnosis Difficulty walking;Generalized weakness   PT Problem List Decreased strength;Decreased activity tolerance;Decreased range of motion;Decreased balance;Decreased mobility;Decreased coordination;Decreased cognition;Decreased knowledge of use of DME;Decreased safety awareness  PT Treatment Interventions DME instruction;Gait training;Stair training;Functional mobility training;Therapeutic activities;Therapeutic exercise;Balance training;Neuromuscular re-education;Cognitive remediation;Patient/family education   PT Goals (Current goals can be found in the Care Plan section) Acute Rehab PT Goals Patient Stated Goal: to go home PT Goal Formulation: With family Time For Goal Achievement: 04/15/16 Potential to Achieve Goals: Fair    Frequency Min 2X/week   Barriers to discharge        Co-evaluation               End of Session Equipment Utilized During Treatment: Gait belt Activity Tolerance:  (limited due to mental status/confusion) Patient left: with chair alarm set;with call bell/phone within reach           Time: [id number] PT Time Calculation (min) (ACUTE ONLY): 20 min   Charges:   PT Evaluation $PT Eval Low Complexity: 1 Procedure     PT G CodesKreg Shropshire, DPT 04/01/2016, 4:08 PM

## 2016-04-01 NOTE — Progress Notes (Addendum)
ANTICOAGULATION CONSULT NOTE - FOLLOW UP   Pharmacy Consult for Warfarin Indication: atrial fibrillation  Allergies  Allergen Reactions  . Glimepiride Other (See Comments)    Reaction:  Leg stiffness   . Lipitor [Atorvastatin Calcium] Other (See Comments)    Reaction:  Unknown   . Oxycodone Other (See Comments)    Reaction:  Altered mental status   . Tramadol Other (See Comments)    Reaction:  Hiccups   . Latex Rash  . Tape Rash    Patient Measurements: Height: 5\' 6"  (167.6 cm) Weight: 139 lb 1.6 oz (63.095 kg) IBW/kg (Calculated) : 63.8  Vital Signs: Temp: 99.1 F (37.3 C) (06/25 0521) Temp Source: Oral (06/25 0521) BP: 146/74 mmHg (06/25 0808) Pulse Rate: 97 (06/25 0808)  Labs:  Recent Labs  03/30/16 1537 03/30/16 1903 03/31/16 1039 04/01/16 0931  HGB 11.3* 10.8*  --   --   HCT 36.0* 34.2*  --   --   PLT 201 187  --   --   LABPROT  --  23.8* 24.6* 27.5*  INR  --  2.15 2.24 2.60  CREATININE 0.43* 0.34*  --   --   TROPONINI <0.03  --   --   --     Estimated Creatinine Clearance: 72.3 mL/min (by C-G formula based on Cr of 0.34).   Medical History: Past Medical History  Diagnosis Date  . Hypertension   . DVT of leg (deep venous thrombosis) ([place]) 2009  . Nephrolithiasis   . Hyperlipidemia   . Allergic rhinitis   . CAD (coronary artery disease)   . Urethral diverticulum   . Varicose vein of leg   . Dupuytren's contracture   . ED (erectile dysfunction)   . History of colonic polyps   . GERD (gastroesophageal reflux disease)   . Diverticulosis of colon   . Myocardial infarction ([place])   . DVT (deep venous thrombosis) ([place])   . Polio 1948    was hospitalized for 1 year  . Diabetes mellitus, type 2 ([place])   . SCC (squamous cell carcinoma)     scalp 2013 per derm  . Pancreatic cancer ([place])   . Femur fracture ([place]) 2017    right, s/p repair    Medications:  Prescriptions prior to admission  Medication Sig Dispense Refill Last Dose  . carvedilol  (COREG) 6.25 MG tablet Take 0.5 tablets (3.125 mg total) by mouth 2 (two) times daily with a meal. 90 tablet 3 03/29/2016 at 1000  . cholecalciferol (VITAMIN D) 1000 units tablet Take 4,000 Units by mouth daily.   03/29/2016 at Unknown time  . Digestive Enzymes CAPS Take 1 capsule by mouth daily.   03/29/2016 at Unknown time  . dronabinol (MARINOL) 2.5 MG capsule Take 1 capsule (2.5 mg total) by mouth 3 (three) times daily with meals.   Past Month at Unknown time  . esomeprazole (NEXIUM) 20 MG capsule Take 20 mg by mouth daily as needed (for heartburn).   Past Month at Unknown time  . lactulose (CHRONULAC) 10 GM/15ML solution Take 20 g by mouth daily as needed for mild constipation.   03/30/2016 at Unknown time  . levothyroxine (SYNTHROID, LEVOTHROID) 25 MCG tablet Take 25 mcg by mouth daily before breakfast.   Past Month at Unknown time  . loperamide (IMODIUM) 2 MG capsule Take 2-4 mg by mouth as needed for diarrhea or loose stools.   Past Week at Unknown time  . magnesium oxide (MAGOX 400) 400 (241.3 Mg) MG tablet  Take 400 mg by mouth 2 (two) times daily.   03/29/2016 at Unknown time  . metFORMIN (GLUCOPHAGE) 500 MG tablet Take 500-1,000 mg by mouth 2 (two) times daily with a meal. Pt takes two tablets in the morning and one at night.   03/29/2016 at Unknown time  . nitroGLYCERIN (NITROSTAT) 0.4 MG SL tablet Place 0.4 mg under the tongue every 5 (five) minutes as needed for chest pain.    Past Month at Unknown time  . ondansetron (ZOFRAN) 8 MG tablet Take 8 mg by mouth every 8 (eight) hours as needed for nausea or vomiting.   Past Month at Unknown time  . rosuvastatin (CRESTOR) 5 MG tablet Take 5 mg by mouth daily.   03/29/2016 at Unknown time  . traMADol (ULTRAM) 50 MG tablet Take 50-100 mg by mouth every 4 (four) hours as needed for moderate pain.   03/29/2016 at 2000  . vitamin C (ASCORBIC ACID) 500 MG tablet Take 1,000 mg by mouth daily.   03/29/2016 at Unknown time  . warfarin (COUMADIN) 2.5 MG tablet  Take 2.5 mg by mouth every evening. Pt takes this dose on Sunday.   03/25/2016 at Twilight   . warfarin (COUMADIN) 5 MG tablet Take 5 mg by mouth daily. Pt takes this dose Monday - Saturday.   03/29/2016 at 1200   Scheduled:  . carvedilol  3.125 mg Oral BID WC  . dronabinol  2.5 mg Oral TID WC  . insulin aspart  0-5 Units Subcutaneous QHS  . insulin aspart  0-9 Units Subcutaneous TID WC  . lactulose  20 g Oral BID  . levothyroxine  25 mcg Oral QAC breakfast  . magnesium oxide  200 mg Oral Daily  . pantoprazole  40 mg Oral Daily  . warfarin  5 mg Oral Once per day on Mon Tue Wed Thu Fri Sat   And  . warfarin  2.5 mg Oral Q Sun-1800  . Warfarin - Pharmacist Dosing Inpatient   Does not apply q1800   Infusions:  . sodium chloride 75 mL/hr at 04/01/16 [id number]    Assessment: 75 y/o M with known h/o pancreatic cancer, liver dysfunction, and CAF admitted with altered mental status. Patient on warfarin 5 mg daily except 2.5 mg Sunday PTA with last dose 6/22. INR 6/22 was 1.9  6/24: INR: 2.24  6/25: INR: 2.60; Goal of Therapy:  INR 2-3 Monitor platelets by anticoagulation protocol: Yes   Plan:  Will continue outpatient dosing of warfarin 2.5 mg and f/u AM INR. Patient did not receive 5 mg dose on 6/23 per MAR.   Shanita Kanan D 04/01/2016,12:45 PM

## 2016-04-01 NOTE — Plan of Care (Signed)
Problem: Education: Goal: Knowledge of [facility] Education information/materials will improve Outcome: Not Progressing Oriented to self only. Daughter at bedside.

## 2016-04-01 NOTE — Progress Notes (Addendum)
[place] [facility] [place] NAME: Nir Delage    MR#:  [id number]  DATE OF BIRTH:  07-09-1941  SUBJECTIVE:   Pt. Due to altered mental status/encephalopathy. Patient's family is at bedside.  Pt much alert and improved per family Wife wants to take pt home  REVIEW OF SYSTEMS:    Review of Systems  Unable to perform ROS: mental acuity    Nutrition: Heart Healthy Tolerating Diet: Yes Tolerating PT: Await Eval.   DRUG ALLERGIES:   Allergies  Allergen Reactions  . Glimepiride Other (See Comments)    Reaction:  Leg stiffness   . Lipitor [Atorvastatin Calcium] Other (See Comments)    Reaction:  Unknown   . Oxycodone Other (See Comments)    Reaction:  Altered mental status   . Tramadol Other (See Comments)    Reaction:  Hiccups   . Latex Rash  . Tape Rash    VITALS:  Blood pressure 146/74, pulse 97, temperature 99.1 F (37.3 C), temperature source Oral, resp. rate 18, height 5\' 6"  (1.676 m), weight 63.095 kg (139 lb 1.6 oz), SpO2 97 %.  PHYSICAL EXAMINATION:   Physical Exam  GENERAL:  75 y.o.-year-old patient lying in the bed Lethargic/encephalopathic.   EYES: Pupils equal, round, reactive to light and accommodation. No scleral icterus. Extraocular muscles intact.  HEENT: Head atraumatic, normocephalic. Oropharynx and nasopharynx clear.  NECK:  Supple, no jugular venous distention. No thyroid enlargement, no tenderness.  LUNGS: Normal breath sounds bilaterally, no wheezing, rales, rhonchi. No use of accessory muscles of respiration.  CARDIOVASCULAR: S1, S2 normal. No murmurs, rubs, or gallops.  ABDOMEN: Soft, nontender, nondistended. Bowel sounds present. No organomegaly or mass.  EXTREMITIES: No cyanosis, clubbing or edema b/l.    NEUROLOGIC: Cranial nerves II through XII are intact. No focal Motor or sensory deficits b/l. Globally weak. PSYCHIATRIC: The patient is alert and oriented x 2.  SKIN: No obvious rash, lesion, or ulcer.     LABORATORY PANEL:   CBC  Recent Labs Lab 03/30/16 1903  WBC 5.2  HGB 10.8*  HCT 34.2*  PLT 187   ------------------------------------------------------------------------------------------------------------------  Chemistries   Recent Labs Lab 03/30/16 1903  NA 137  K 3.4*  CL 101  CO2 28  GLUCOSE 142*  BUN 9  CREATININE 0.34*  CALCIUM 8.7*  AST 18  ALT 12*  ALKPHOS 98  BILITOT 0.9   ------------------------------------------------------------------------------------------------------------------  Cardiac Enzymes  Recent Labs Lab 03/30/16 1537  TROPONINI <0.03   ------------------------------------------------------------------------------------------------------------------  RADIOLOGY:  Dg Chest Port 1 View  03/30/2016  CLINICAL DATA:  Altered mental status and weakness, personal history of pancreatic cancer EXAM: PORTABLE CHEST 1 VIEW COMPARISON:  None. FINDINGS: Limited inspiratory effect. Heart size upper normal with normal vascular pattern. Port-A-Cath identified on the right. Opacity at both lower lobes suggests a combination of pleural effusion and underlying airspace disease which could be due to atelectasis. IMPRESSION: Bilateral pleural effusions with underlying lower lobe consolidation. Electronically Signed   By: Skipper Cliche M.D.   On: 03/30/2016 16:49     ASSESSMENT AND PLAN:   75 year old male with past medical history of stage IV pancreatic cancer, diabetes, hypertension, GERD, history of DVT, history of femur fracture, hyperlipidemia who presented to the hospital due to altered mental status.  1. Altered mental status-this is metabolic encephalopathy etiology unclear though. Suspected to be hepatic encephalopathy. -Patient improved a lot per  family and RN -avoid narcotics, benzodiazepines. -Continue lactulose and titrate 2-3 loose bowel movements  per day  2. 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 -Continue lactulose to titrate to 2-3 loose bowel movements per day.  3. GERD-continue Protonix.  4. Diabetes type 2 without complication-continue sliding scale insulin.  5. Essential hypertension-continue Coreg.  6. Hypothyroidism-continue Synthroid.  7. Pancreatic cancer stage IV-patient follows up at [facility]. Prognosis is poor. Patient has a follow-up with his oncologist next week on Thursday. -Continue tramadol for pain. I had a long discussion about patient's goals of care with the patient's wife and family.  Patient is a DO NOT RESUSCITATE for now.  8. History of previous DVT-continue warfarin. INR therapeutic.  PT to see today. Wife wants to take pt home  All the records are reviewed and case discussed with Care Management/Social Workerr. Management plans discussed with the patient, family and they are in agreement.  CODE STATUS: DO NOT RESUSCITATE  DVT Prophylaxis: Warfarin  TOTAL TIME TAKING CARE OF THIS PATIENT: 25 minutes.   POSSIBLE D/C IN 1 DAYS, DEPENDING ON CLINICAL CONDITION.  Greater than 50% of time spent in discussing plan of care with the patient's family.   Steve Youngberg M.D on 04/01/2016 at 1:53 PM  Between 7am to 6pm - Pager - [phone]  After 6pm go to [email or web address] - password [email or web address] [place] [facility]  Office  [phone]  CC: Primary care physician; Elsie Stain, MD

## 2016-04-01 NOTE — Plan of Care (Signed)
Problem: Education: Goal: Knowledge of [facility] Education information/materials will improve Outcome: Progressing Pt is more alert today, follow commands. Oriented to self. Calm and cooperative.  Problem: Pain Managment: Goal: General experience of comfort will improve Outcome: Progressing Tramadol given once for abd pain with improvement.

## 2016-04-02 ENCOUNTER — Telehealth: Payer: Self-pay

## 2016-04-02 LAB — PROTIME-INR
INR: 3.05
Prothrombin Time: 31 seconds — ABNORMAL HIGH (ref 11.4–15.0)

## 2016-04-02 LAB — GLUCOSE, CAPILLARY
GLUCOSE-CAPILLARY: 138 mg/dL — AB (ref 65–99)
Glucose-Capillary: 142 mg/dL — ABNORMAL HIGH (ref 65–99)

## 2016-04-02 MED ORDER — WARFARIN SODIUM 5 MG PO TABS: 2.5000 mg | ORAL_TABLET | Freq: Once | ORAL | Status: DC

## 2016-04-02 MED ORDER — ENSURE ENLIVE PO LIQD: 237.0000 mL | Freq: Two times a day (BID) | ORAL | Status: DC

## 2016-04-02 MED ORDER — MAGNESIUM OXIDE 400 (241.3 MG) MG PO TABS: 400.0000 mg | ORAL_TABLET | Freq: Two times a day (BID) | ORAL | Status: DC

## 2016-04-02 NOTE — Discharge Summary (Signed)
[facility] NAME: Ronnie Johnson    MR#:  [id number]  DATE OF BIRTH:  12/06/1940  DATE OF ADMISSION:  03/30/2016 ADMITTING PHYSICIAN: Lytle Butte, MD  DATE OF DISCHARGE: 04/02/2016  PRIMARY CARE PHYSICIAN: Elsie Stain, MD    ADMISSION DIAGNOSIS:  Weakness [R53.1] Altered mental status, unspecified altered mental status type [R41.82]  DISCHARGE DIAGNOSIS:  Encephalopathy-metabolic-resolved Pancreatic cancer-stage IV SECONDARY DIAGNOSIS:   Past Medical History  Diagnosis Date  . Hypertension   . DVT of leg (deep venous thrombosis) ([place]) 2009  . Nephrolithiasis   . Hyperlipidemia   . Allergic rhinitis   . CAD (coronary artery disease)   . Urethral diverticulum   . Varicose vein of leg   . Dupuytren's contracture   . ED (erectile dysfunction)   . History of colonic polyps   . GERD (gastroesophageal reflux disease)   . Diverticulosis of colon   . Myocardial infarction ([place])   . DVT (deep venous thrombosis) ([place])   . Polio 1948    was hospitalized for 1 year  . Diabetes mellitus, type 2 ([place])   . SCC (squamous cell carcinoma)     scalp 2013 per derm  . Pancreatic cancer ([place])   . Femur fracture ([place]) 2017    right, s/p repair    HOSPITAL COURSE:   75 year old male with past medical history of stage IV pancreatic cancer, diabetes, hypertension, GERD, history of DVT, history of femur fracture, hyperlipidemia who presented to the hospital due to altered mental status.  1. Altered mental status-this is metabolic encephalopathy etiology unclear though. Suspected to be hepatic encephalopathy. -Patient improved a lot per family and RN -avoid narcotics, benzodiazepines. -Continue lactulose and titrate 2-3 loose bowel movements per day  2. 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 -Continue lactulose to titrate to 2-3 loose bowel movements per day.  3.  GERD-continue Protonix.  4. Diabetes type 2 without complication-continue sliding scale insulin.  5. Essential hypertension-continue Coreg.  6. Hypothyroidism-continue Synthroid.  7. Pancreatic cancer stage IV-patient follows up at [facility]. Prognosis is poor. Patient has a follow-up with his oncologist next week on Thursday. -Continue tramadol for pain. Dr Verdell Carmine had a long discussion about patient's goals of care with the patient's wife and family. Patient is a DO NOT RESUSCITATE   8. History of previous DVT-continue warfarin. INR therapeutic.  Pt recommends HHPT  Overall stable D/c home Wife agreeable CONSULTS OBTAINED:  Treatment Team:  Lytle Butte, MD  DRUG ALLERGIES:   Allergies  Allergen Reactions  . Glimepiride Other (See Comments)    Reaction:  Leg stiffness   . Lipitor [Atorvastatin Calcium] Other (See Comments)    Reaction:  Unknown   . Oxycodone Other (See Comments)    Reaction:  Altered mental status   . Tramadol Other (See Comments)    Reaction:  Hiccups   . Latex Rash  . Tape Rash    DISCHARGE MEDICATIONS:   Current Discharge Medication List    START taking these medications   Details  feeding supplement, ENSURE ENLIVE, (ENSURE ENLIVE) LIQD Take 237 mLs by mouth 2 (two) times daily between meals. Qty: 237 mL, Refills: 12      CONTINUE these medications which have NOT CHANGED   Details  carvedilol (COREG) 6.25 MG tablet Take 0.5 tablets (3.125 mg total) by mouth 2 (two) times daily with a meal. Qty: 90 tablet, Refills: 3    cholecalciferol (VITAMIN  D) 1000 units tablet Take 4,000 Units by mouth daily.    Digestive Enzymes CAPS Take 1 capsule by mouth daily.    dronabinol (MARINOL) 2.5 MG capsule Take 1 capsule (2.5 mg total) by mouth 3 (three) times daily with meals.    esomeprazole (NEXIUM) 20 MG capsule Take 20 mg by mouth daily as needed (for heartburn).    lactulose (CHRONULAC) 10 GM/15ML solution Take 20 g by mouth daily as  needed for mild constipation.    levothyroxine (SYNTHROID, LEVOTHROID) 25 MCG tablet Take 25 mcg by mouth daily before breakfast.    loperamide (IMODIUM) 2 MG capsule Take 2-4 mg by mouth as needed for diarrhea or loose stools.    magnesium oxide (MAGOX 400) 400 (241.3 Mg) MG tablet Take 400 mg by mouth 2 (two) times daily.    metFORMIN (GLUCOPHAGE) 500 MG tablet Take 500-1,000 mg by mouth 2 (two) times daily with a meal. Pt takes two tablets in the morning and one at night.    nitroGLYCERIN (NITROSTAT) 0.4 MG SL tablet Place 0.4 mg under the tongue every 5 (five) minutes as needed for chest pain.     ondansetron (ZOFRAN) 8 MG tablet Take 8 mg by mouth every 8 (eight) hours as needed for nausea or vomiting.    rosuvastatin (CRESTOR) 5 MG tablet Take 5 mg by mouth daily.    traMADol (ULTRAM) 50 MG tablet Take 50-100 mg by mouth every 4 (four) hours as needed for moderate pain.    vitamin C (ASCORBIC ACID) 500 MG tablet Take 1,000 mg by mouth daily.    !! warfarin (COUMADIN) 2.5 MG tablet Take 2.5 mg by mouth every evening. Pt takes this dose on Sunday.    !! warfarin (COUMADIN) 5 MG tablet Take 5 mg by mouth daily. Pt takes this dose Monday - Saturday.     !! - Potential duplicate medications found. Please discuss with provider.    STOP taking these medications     magnesium 30 MG tablet         If you experience worsening of your admission symptoms, develop shortness of breath, life threatening emergency, suicidal or homicidal thoughts you must seek medical attention immediately by calling 911 or calling your MD immediately  if symptoms less severe.  You Must read complete instructions/literature along with all the possible adverse reactions/side effects for all the Medicines you take and that have been prescribed to you. Take any new Medicines after you have completely understood and accept all the possible adverse reactions/side effects.   Please note  You were cared for by a  hospitalist during your hospital stay. If you have any questions about your discharge medications or the care you received while you were in the hospital after you are discharged, you can call the unit and asked to speak with the hospitalist on call if the hospitalist that took care of you is not available. Once you are discharged, your primary care physician will handle any further medical issues. Please note that NO REFILLS for any discharge medications will be authorized once you are discharged, as it is imperative that you return to your primary care physician (or establish a relationship with a primary care physician if you do not have one) for your aftercare needs so that they can reassess your need for medications and monitor your lab values. Today   SUBJECTIVE   Doing well  VITAL SIGNS:  Blood pressure 137/83, pulse 89, temperature 98.7 F (37.1 C), temperature source Oral,  resp. rate 17, height 5\' 6"  (1.676 m), weight 63.095 kg (139 lb 1.6 oz), SpO2 94 %.  I/O:   Intake/Output Summary (Last 24 hours) at 04/02/16 0710 Last data filed at 04/01/16 1950  Gross per 24 hour  Intake      0 ml  Output    100 ml  Net   -100 ml    PHYSICAL EXAMINATION:  GENERAL:  75 y.o.-year-old patient lying in the bed with no acute distress.  EYES: Pupils equal, round, reactive to light and accommodation. No scleral icterus. Extraocular muscles intact.  HEENT: Head atraumatic, normocephalic. Oropharynx and nasopharynx clear.  NECK:  Supple, no jugular venous distention. No thyroid enlargement, no tenderness.  LUNGS: Normal breath sounds bilaterally, no wheezing, rales,rhonchi or crepitation. No use of accessory muscles of respiration.  CARDIOVASCULAR: S1, S2 normal. No murmurs, rubs, or gallops.  ABDOMEN: Soft, non-tender, non-distended. Bowel sounds present. No organomegaly or mass.  EXTREMITIES: No pedal edema, cyanosis, or clubbing.  NEUROLOGIC: Cranial nerves II through XII are intact. Muscle  strength 5/5 in all extremities. Sensation intact. Gait not checked.  PSYCHIATRIC: The patient is alert and oriented x 3.  SKIN: No obvious rash, lesion, or ulcer.   DATA REVIEW:   CBC   Recent Labs Lab 03/30/16 1903  WBC 5.2  HGB 10.8*  HCT 34.2*  PLT 187    Chemistries   Recent Labs Lab 03/30/16 1903  NA 137  K 3.4*  CL 101  CO2 28  GLUCOSE 142*  BUN 9  CREATININE 0.34*  CALCIUM 8.7*  AST 18  ALT 12*  ALKPHOS 98  BILITOT 0.9    Microbiology Results   No results found for this or any previous visit (from the past 240 hour(s)).  RADIOLOGY:  No results found.   Management plans discussed with the patient, family and they are in agreement.  CODE STATUS:     Code Status Orders        Start     Ordered   03/31/16 1322  Do not attempt resuscitation (DNR)   Continuous    Question Answer Comment  In the event of cardiac or respiratory ARREST Do not call a "code blue"   In the event of cardiac or respiratory ARREST Do not perform Intubation, CPR, defibrillation or ACLS   In the event of cardiac or respiratory ARREST Use medication by any route, position, wound care, and other measures to relive pain and suffering. May use oxygen, suction and manual treatment of airway obstruction as needed for comfort.      03/31/16 1321    Code Status History    Date Active Date Inactive Code Status Order ID Comments User Context   03/30/2016  5:22 PM 03/31/2016  1:21 PM Full Code [id number]  Lytle Butte, MD ED   06/23/2015 11:20 AM 06/23/2015  2:51 PM Full Code [id number]  Algernon Huxley, MD Inpatient    Advance Directive Documentation        Most Recent Value   Type of Advance Directive  Healthcare Power of Attorney   Pre-existing out of facility DNR order (yellow form or pink MOST form)     "MOST" Form in Place?        TOTAL TIME TAKING CARE OF THIS PATIENT: 40 minutes.    Bodi Palmeri M.D on 04/02/2016 at 7:10 AM  Between 7am to 6pm - Pager - [phone] After 6pm  go to [email or web address] - password Child psychotherapist Hospitalists  Office  [phone]  CC: Primary care physician; Elsie Stain, MD

## 2016-04-02 NOTE — Progress Notes (Signed)
ANTICOAGULATION CONSULT NOTE - FOLLOW UP   Pharmacy Consult for Warfarin Indication: atrial fibrillation  Allergies  Allergen Reactions  . Glimepiride Other (See Comments)    Reaction:  Leg stiffness   . Lipitor [Atorvastatin Calcium] Other (See Comments)    Reaction:  Unknown   . Oxycodone Other (See Comments)    Reaction:  Altered mental status   . Tramadol Other (See Comments)    Reaction:  Hiccups   . Latex Rash  . Tape Rash   Patient Measurements: Height: 5\' 6"  (167.6 cm) Weight: 139 lb 1.6 oz (63.095 kg) IBW/kg (Calculated) : 63.8  Vital Signs: Temp: 98.7 F (37.1 C) (06/26 0441) Temp Source: Oral (06/26 0441) BP: 137/83 mmHg (06/26 0441) Pulse Rate: 89 (06/26 0441)  Labs:  Recent Labs  03/30/16 1537  03/30/16 1903 03/31/16 1039 04/01/16 0931 04/02/16 0427  HGB 11.3*  --  10.8*  --   --   --   HCT 36.0*  --  34.2*  --   --   --   PLT 201  --  187  --   --   --   LABPROT  --   < > 23.8* 24.6* 27.5* 31.0*  INR  --   < > 2.15 2.24 2.60 3.05  CREATININE 0.43*  --  0.34*  --   --   --   TROPONINI <0.03  --   --   --   --   --   < > = values in this interval not displayed.  Estimated Creatinine Clearance: 72.3 mL/min (by C-G formula based on Cr of 0.34).   Medical History: Past Medical History  Diagnosis Date  . Hypertension   . DVT of leg (deep venous thrombosis) ([place]) 2009  . Nephrolithiasis   . Hyperlipidemia   . Allergic rhinitis   . CAD (coronary artery disease)   . Urethral diverticulum   . Varicose vein of leg   . Dupuytren's contracture   . ED (erectile dysfunction)   . History of colonic polyps   . GERD (gastroesophageal reflux disease)   . Diverticulosis of colon   . Myocardial infarction ([place])   . DVT (deep venous thrombosis) ([place])   . Polio 1948    was hospitalized for 1 year  . Diabetes mellitus, type 2 ([place])   . SCC (squamous cell carcinoma)     scalp 2013 per derm  . Pancreatic cancer ([place])   . Femur fracture ([place]) 2017   right, s/p repair    Medications:  Prescriptions prior to admission  Medication Sig Dispense Refill Last Dose  . carvedilol (COREG) 6.25 MG tablet Take 0.5 tablets (3.125 mg total) by mouth 2 (two) times daily with a meal. 90 tablet 3 03/29/2016 at 1000  . cholecalciferol (VITAMIN D) 1000 units tablet Take 4,000 Units by mouth daily.   03/29/2016 at Unknown time  . Digestive Enzymes CAPS Take 1 capsule by mouth daily.   03/29/2016 at Unknown time  . dronabinol (MARINOL) 2.5 MG capsule Take 1 capsule (2.5 mg total) by mouth 3 (three) times daily with meals.   Past Month at Unknown time  . esomeprazole (NEXIUM) 20 MG capsule Take 20 mg by mouth daily as needed (for heartburn).   Past Month at Unknown time  . lactulose (CHRONULAC) 10 GM/15ML solution Take 20 g by mouth daily as needed for mild constipation.   03/30/2016 at Unknown time  . levothyroxine (SYNTHROID, LEVOTHROID) 25 MCG tablet Take 25 mcg by mouth daily  before breakfast.   Past Month at Unknown time  . loperamide (IMODIUM) 2 MG capsule Take 2-4 mg by mouth as needed for diarrhea or loose stools.   Past Week at Unknown time  . magnesium oxide (MAGOX 400) 400 (241.3 Mg) MG tablet Take 400 mg by mouth 2 (two) times daily.   03/29/2016 at Unknown time  . metFORMIN (GLUCOPHAGE) 500 MG tablet Take 500-1,000 mg by mouth 2 (two) times daily with a meal. Pt takes two tablets in the morning and one at night.   03/29/2016 at Unknown time  . nitroGLYCERIN (NITROSTAT) 0.4 MG SL tablet Place 0.4 mg under the tongue every 5 (five) minutes as needed for chest pain.    Past Month at Unknown time  . ondansetron (ZOFRAN) 8 MG tablet Take 8 mg by mouth every 8 (eight) hours as needed for nausea or vomiting.   Past Month at Unknown time  . rosuvastatin (CRESTOR) 5 MG tablet Take 5 mg by mouth daily.   03/29/2016 at Unknown time  . traMADol (ULTRAM) 50 MG tablet Take 50-100 mg by mouth every 4 (four) hours as needed for moderate pain.   03/29/2016 at 2000  . vitamin  C (ASCORBIC ACID) 500 MG tablet Take 1,000 mg by mouth daily.   03/29/2016 at Unknown time  . warfarin (COUMADIN) 2.5 MG tablet Take 2.5 mg by mouth every evening. Pt takes this dose on Sunday.   03/25/2016 at [place]   . warfarin (COUMADIN) 5 MG tablet Take 5 mg by mouth daily. Pt takes this dose Monday - Saturday.   03/29/2016 at 1200   Scheduled:  . carvedilol  3.125 mg Oral BID WC  . dronabinol  2.5 mg Oral TID WC  . feeding supplement (ENSURE ENLIVE)  237 mL Oral BID BM  . insulin aspart  0-5 Units Subcutaneous QHS  . insulin aspart  0-9 Units Subcutaneous TID WC  . lactulose  20 g Oral BID  . levothyroxine  25 mcg Oral QAC breakfast  . magnesium oxide  200 mg Oral Daily  . pantoprazole  40 mg Oral Daily  . warfarin  2.5 mg Oral Q Sun-1800  . warfarin  2.5 mg Oral ONCE-1800  . Warfarin - Pharmacist Dosing Inpatient   Does not apply q1800   Infusions:  . sodium chloride 75 mL/hr at 04/01/16 2030    Assessment: 75 y/o M with known h/o pancreatic cancer, liver dysfunction, and CAF admitted with altered mental status. Patient on warfarin 5 mg daily except 2.5 mg Sunday PTA with last dose 6/22. INR 6/22 was 1.9  6/24: INR: 2.24  6/25: INR: 2.60 6/26: INR 3.05 Goal of Therapy:  INR 2-3 Monitor platelets by anticoagulation protocol: Yes   Plan:  Will give warfarin 2.5 mg PO once tonight INR tomorrow AM  Lenis Noon, PharmD 04/02/2016,1:10 PM

## 2016-04-02 NOTE — Telephone Encounter (Signed)
Ronnie Johnson left v/m; pt was discharged from [facility] today; requesting refill magnesium oxide (Magox) 400 mg taking one tab bid but while in hospital pt was taking Magox 200 mg daily. Do not see where Dr Damita Dunnings has previously prescribed.Please advise. Pt has /fu appt from [facility] stay on 04/16/16. Unable to leave v/m due to bad connection. Need to verify dosage and pharmacy.

## 2016-04-02 NOTE — Telephone Encounter (Signed)
Ronnie Johnson with [facility] left v/m requesting orders for palliative care orders; Hilda Blades will fax order for palliative care orders to Dr Damita Dunnings today; can be faxed back to [phone]. Pt is being discharged from [facility] today where palliative care was recommended for consult for goals of care.

## 2016-04-02 NOTE — Telephone Encounter (Signed)
Sent.  He was on 400mg  bid per EMR charting.  Thanks.

## 2016-04-02 NOTE — Progress Notes (Signed)
Pt and wife given d/c instructions r/t activity, diet, follow up care and medications, voiced understanding, pt d/c home via wheelchair escorted by family and auxillary

## 2016-04-02 NOTE — Telephone Encounter (Signed)
Mrs Livaudais cb and left v/m requesting refill for Magnesium 400 mg to [facility].

## 2016-04-02 NOTE — Care Management Note (Addendum)
Case Management Note  Patient Details  Name: Ronnie Johnson MRN: [id number] Date of Birth: Oct 29, 1940  Subjective/Objective:                   Spoke with patient's wife regarding discharge to home today. MD came in and was not able to assess DME need. She would like to use [facility] for home health. She would also like to use palliative services. Plan for discharge to home today.  Action/Plan:  RNCM to follow up with wife regarding DME and transportation needs- if EMS is needed. Referral to Santiago Glad with palliative and [facility].   Expected Discharge Date:                  Expected Discharge Plan:     In-House Referral:     Discharge planning Services     Post Acute Care Choice:  Home Health Choice offered to:  Spouse, Adult Children  DME Arranged:    DME Agency:     HH Arranged:    HH Agency:     Status of Service:  In process, will continue to follow  If discussed at Long Length of Stay Meetings, dates discussed:    Additional Comments: PCP is with [facility]. Patient can ride in private vehicle per wife. Has walker and bedside commode. No further RNCM needs.    Marshell Garfinkel, RN 04/02/2016, 8:29 AM

## 2016-04-02 NOTE — Progress Notes (Signed)
New referral for Home Palliative services received from [facility]. Information faxed to [facility] referral.Thank you. Flo Shanks RN, BSN, [place] [facility] Liaison

## 2016-04-02 NOTE — Telephone Encounter (Signed)
I'll await the hard copy.  Thanks.

## 2016-04-03 ENCOUNTER — Telehealth: Payer: Self-pay

## 2016-04-03 ENCOUNTER — Telehealth: Payer: Self-pay | Admitting: *Deleted

## 2016-04-03 ENCOUNTER — Telehealth: Payer: Self-pay | Admitting: Cardiovascular Disease

## 2016-04-03 NOTE — Telephone Encounter (Signed)
Mrs Friedly left v/m requesting cb to verify if pt should take coumadin in the morning or in the evening.

## 2016-04-03 NOTE — Telephone Encounter (Signed)
I would need a referral back from his currently oncologist prior to being able to look in his chart to offer treatment advice.

## 2016-04-03 NOTE — Telephone Encounter (Signed)
Left detailed message on voicemail.  

## 2016-04-03 NOTE — Telephone Encounter (Signed)
Patients wife called wanting to clarify his medications. He was recently discharged from the hospital and was not clear on the carvedilol prescription. Reviewed instructions with her and instructed her that per discharge instructions he is to take 0.5 tablet of the carvedilol twice daily with a meal. She verbalized understanding of instructions and had no further questions at this time. Let her know to call back if she should have any further questions.

## 2016-04-03 NOTE — Telephone Encounter (Addendum)
Asking if you will look at his chart in [place] and advise if we can give him Herceptin  Per [facility] match. 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 Would like to get treatment here if at all possible. His amonia level was very high and he is on Lactulose

## 2016-04-03 NOTE — Telephone Encounter (Signed)
Pt wife calling asking about medication. Pt was just released from hospital  Pt was told to take Half tablet 2 x and day  Needs to know the dose on Carvedilol  Please advise.

## 2016-04-03 NOTE — Telephone Encounter (Signed)
1st attempt to reach pt for TCM. Pt's spouse answered phone stating she was waiting to hear back about a prescription refill. She received an inbound call and then disconnected from our call. Will attempt to reach pt on 04/04/16.

## 2016-04-03 NOTE — Telephone Encounter (Signed)
Typically done in the evening, reasonable to do evening dosing.  Thanks.

## 2016-04-05 ENCOUNTER — Telehealth: Payer: Self-pay

## 2016-04-05 NOTE — Telephone Encounter (Signed)
Left detailed message on voicemail of Ronnie Johnson.

## 2016-04-05 NOTE — Telephone Encounter (Signed)
please give the order.  Thanks.

## 2016-04-05 NOTE — Telephone Encounter (Signed)
Kim with [facility] left v/m requesting verbal orders for home health PT 2 x a week for 9 weeks for muscle weakness and reconditioning and balance training.

## 2016-04-06 ENCOUNTER — Telehealth: Payer: Self-pay

## 2016-04-06 NOTE — Telephone Encounter (Signed)
Left detailed message on voicemail.  

## 2016-04-06 NOTE — Telephone Encounter (Signed)
I would aim for 2-3 loose bowel movements per day.  He shouldn't get dehydrated from that.  If having 2-3 loose bowel movements per day and still making light colored urine, then should be okay.  Can offer gatorade in the meantime as tolerated.   That should be okay to do.   I hope he can get some relief with inc in BM frequency.   Thanks.

## 2016-04-06 NOTE — Telephone Encounter (Signed)
Mrs Pasqualone left v/m; pts personality has changed and ammonia level is up; Mrs Bazil has given pt lactulose 2 or 3 times. Pt had BM on 04/05/16. Mrs Mergel wants to know if takes lactulose today and over the weekend and pt eating very little; pt might get dehydrated and Mrs Walsh wants to know what she should do.  Mrs Cirrito wants to know what Dr Damita Dunnings thinks about chances of electrolytes dropping too low (potassium and sodium) pt does not have hospice yet and Mrs Kelderman concerned about this being a holiday weekend. Mrs Bruck request cb.

## 2016-04-09 ENCOUNTER — Other Ambulatory Visit: Payer: PPO

## 2016-04-09 ENCOUNTER — Telehealth: Payer: Self-pay | Admitting: Radiology

## 2016-04-09 DIAGNOSIS — C259 Malignant neoplasm of pancreas, unspecified: Secondary | ICD-10-CM

## 2016-04-09 NOTE — Telephone Encounter (Signed)
Called pt and wife.  Per wife, he has decided not to go through any more attempts at active tx.   Reasonable for comfort care.  Would be hospice eligible with life expectancy <6 months.   Will ask for hospice input, with me still listed as the primary.   App help of all involved.    I called hospice and they'll work on the referral today.  Please fax printed notes and demographics to [phone].   Thanks.

## 2016-04-09 NOTE — Telephone Encounter (Signed)
Pt came in today for a scheduled INR, unaware that one was done 1 week ago at the hospital. I didn't do an INR today  Wife, Izora Gala would like you to call her, pt and spouse want to DC all medications and scheduled procedures. Please advise. Also, Hospice care.

## 2016-04-09 NOTE — Telephone Encounter (Signed)
Information faxed to [phone] as requested.

## 2016-04-11 ENCOUNTER — Encounter: Payer: Self-pay | Admitting: Family Medicine

## 2016-04-11 DIAGNOSIS — Z515 Encounter for palliative care: Secondary | ICD-10-CM | POA: Insufficient documentation

## 2016-04-12 ENCOUNTER — Telehealth: Payer: Self-pay

## 2016-04-12 NOTE — Telephone Encounter (Signed)
I have been given no information that this was a potential ME case.   I didn't have any reason to suspect that the ME would be (or would need to be) involved.   Thanks.

## 2016-04-12 NOTE — Telephone Encounter (Signed)
Vivien Rota nurse with [facility] left v/m; protocol orders not signed yet;requesting verbal order for senna; pt is basically refusing all med except the pain med, tramadol. Vivien Rota is concerned about coumadin; pt took one tab yesterday; had not take 2-3 days before; Vivien Rota wants to know if PT/INR needs to be drawn on pt or can coumadin be stopped. Vivien Rota request cb.

## 2016-04-12 NOTE — Telephone Encounter (Signed)
I signed the orders- they should be in route.   It is reasonable to stop coumadin.  If not taking any (or minimal PO), then would be reasonable to stop coumadin and d/c INRs.  Continued coumadin use may be more hazardous than beneficial since regulating INRs would likely be very difficult and he could very easily go high.   I would be okay with discontinuing all meds except for comfort meds.  Thanks.

## 2016-04-12 NOTE — Telephone Encounter (Signed)
Vivien Rota advised.  Vivien Rota also says that her medical director questioned the fall the patient had back in May and the DVT afterwards and is wondering if this is an ME case?

## 2016-04-13 NOTE — Telephone Encounter (Signed)
Left detailed message on voicemail of Ronnie Johnson with Hospice.

## 2016-04-15 ENCOUNTER — Other Ambulatory Visit: Payer: Self-pay | Admitting: Family Medicine

## 2016-04-16 ENCOUNTER — Ambulatory Visit: Payer: PPO | Admitting: Family Medicine

## 2016-04-16 ENCOUNTER — Telehealth: Payer: Self-pay | Admitting: Family Medicine

## 2016-04-16 DIAGNOSIS — Z66 Do not resuscitate: Secondary | ICD-10-CM
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
## 2016-04-19 ENCOUNTER — Telehealth: Payer: Self-pay

## 2016-04-19 MED ORDER — TRAMADOL HCL 50 MG PO TABS: ORAL_TABLET | ORAL | Status: AC

## 2016-04-19 NOTE — Telephone Encounter (Signed)
I would use tramadol 1 tab every 6 hours as needed for pain.  I wouldn't use it more often than that given his liver mets.   Would use morphine as needed on top of tramadol 1 tab every 6 hours as needed for pain.  Thanks.

## 2016-04-19 NOTE — Telephone Encounter (Signed)
Vivien Rota nurse with [facility] left v/m requesting increase in Tramadol; presently one tab q 12 hours; Vivien Rota request tramadol 1-2 tabs q4-6 hours prn due to increase pain. Started morphine on pt last night due to increased breakthru pain. Vivien Rota request cb.

## 2016-04-19 NOTE — Telephone Encounter (Signed)
Toni advised.  

## 2016-04-20 ENCOUNTER — Other Ambulatory Visit: Payer: Self-pay | Admitting: Family Medicine

## 2016-04-20 NOTE — Telephone Encounter (Signed)
x

## 2016-04-23 ENCOUNTER — Telehealth: Payer: Self-pay | Admitting: Family Medicine

## 2016-04-23 NOTE — Telephone Encounter (Signed)
Izora Gala called to let you know Mr Pasierb passed away yesterday

## 2016-04-23 NOTE — Telephone Encounter (Signed)
Please update the card.  Thanks.

## 2016-04-23 NOTE — Telephone Encounter (Signed)
Noted, thanks, see below.

## 2016-04-23 NOTE — Telephone Encounter (Signed)
I called his wife.  She thanked me for calling.  Condolences offered.  I was glad to see this kind gentleman here in the clinic.  I never heard him complain about his diagnosis or health problems.

## 2016-04-23 NOTE — Telephone Encounter (Signed)
[facility] 7/16 @ 10:55 am

## 2016-05-08 DEATH — deceased

## 2016-05-11 ENCOUNTER — Ambulatory Visit: Payer: PPO | Admitting: Podiatry

## 2016-09-15 ENCOUNTER — Other Ambulatory Visit: Payer: Self-pay | Admitting: Nurse Practitioner

## 2017-06-12 IMAGING — CT CT HEAD W/O CM
3 series · 17 of 47 positions shown, 20 images · non-contrast
Comparison: None.

CLINICAL DATA: Fall with confusion.

EXAM:
CT HEAD WITHOUT CONTRAST
TECHNIQUE: Contiguous axial images were obtained from the base of the skull
through the vertex without intravenous contrast.

[Series 2: headseq 4.8 h45s · axial · 0.43mm/px · z∈[-105,+40]mm · 11 of 36 slices shown, 14 images]
[im 3/36  brain]
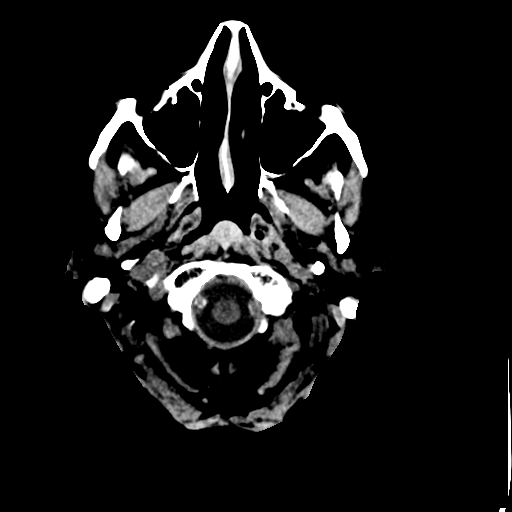
[im 3/36  bone]
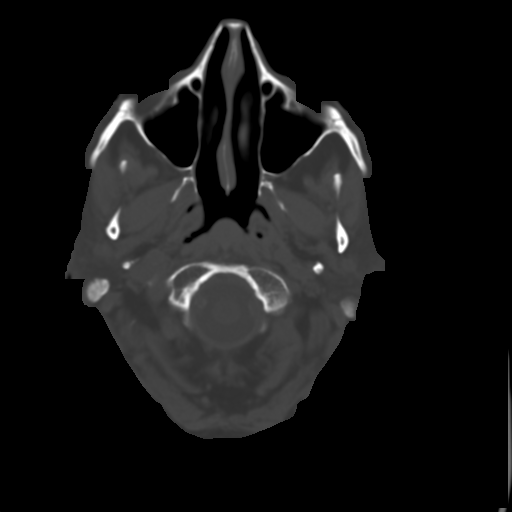
[im 5/36  brain]
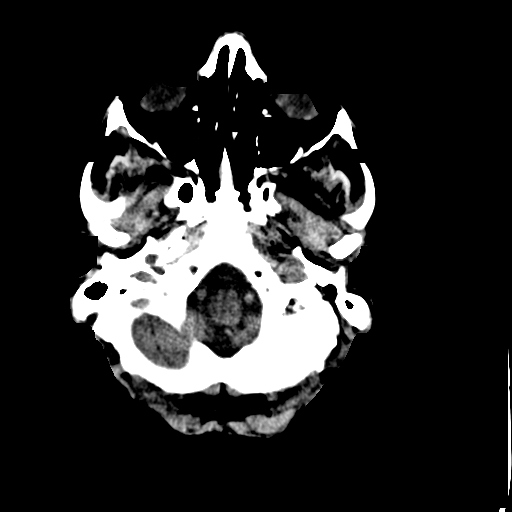
[im 9/36  brain]
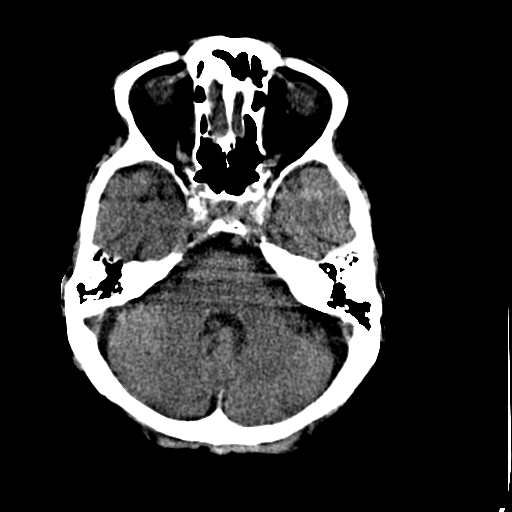
[im 11/36  brain]
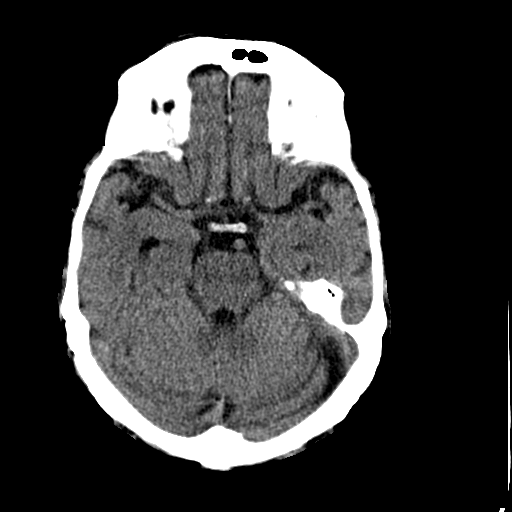
[im 15/36  brain]
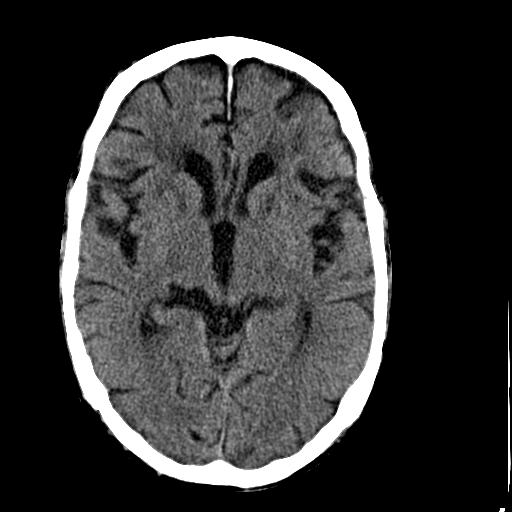
[im 15/36  bone]
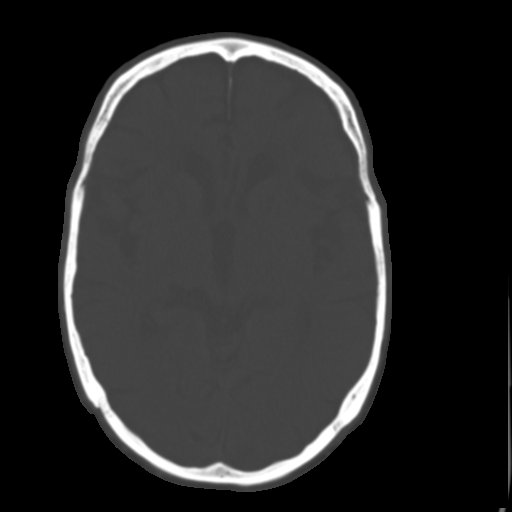
[im 19/36  brain]
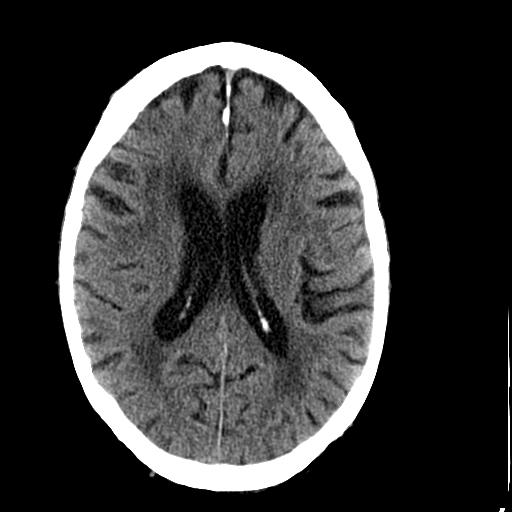
[im 21/36  brain]
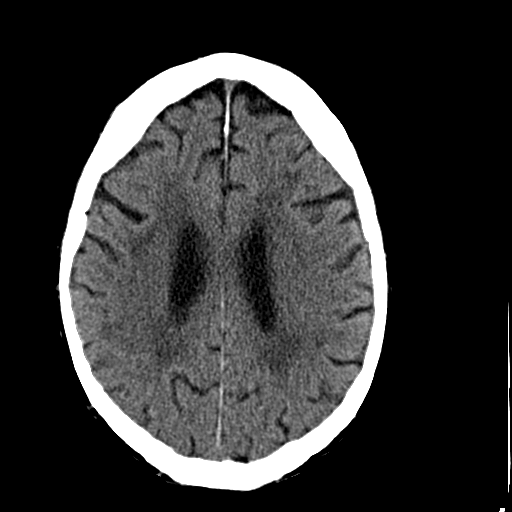
[im 25/36  brain]
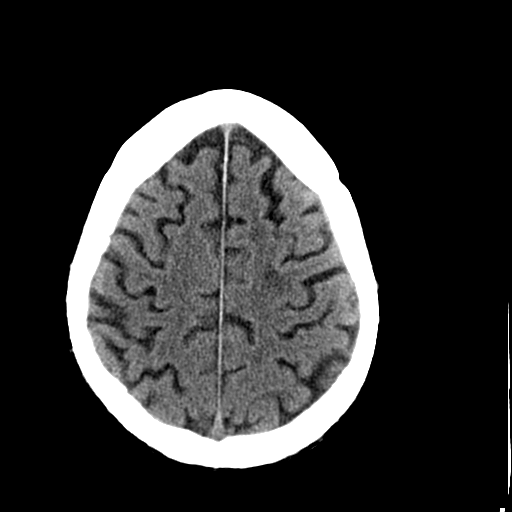
[im 27/36  brain]
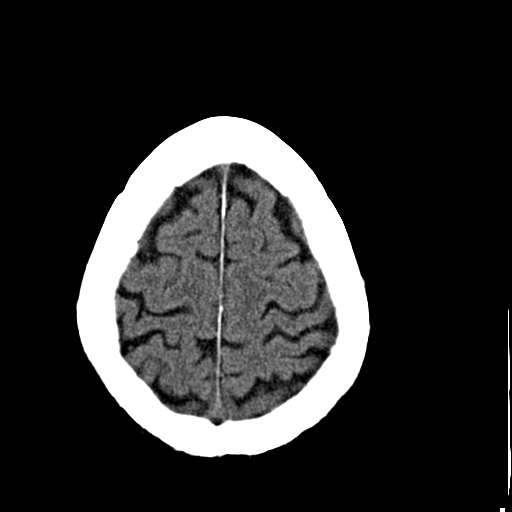
[im 27/36  bone]
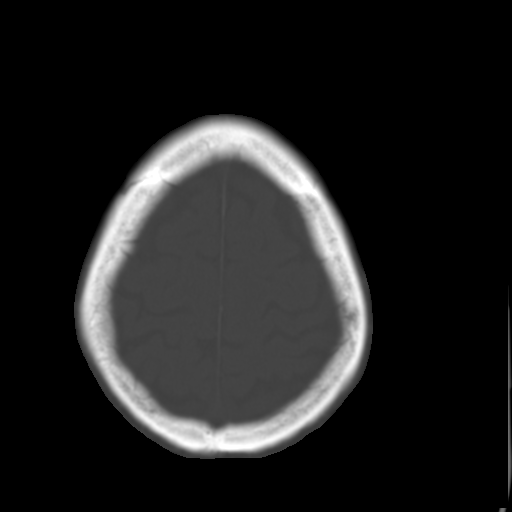
[im 31/36  brain]
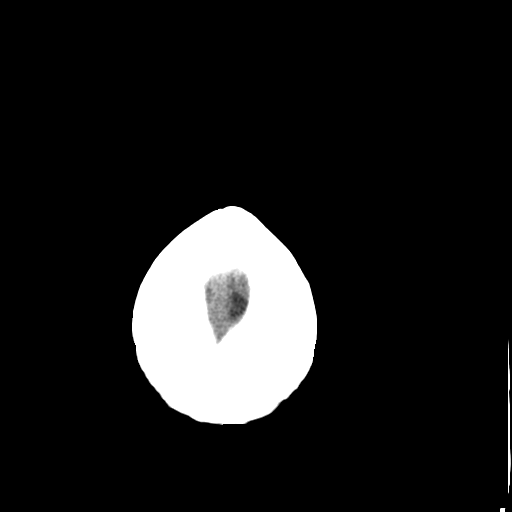
[im 33/36  brain]
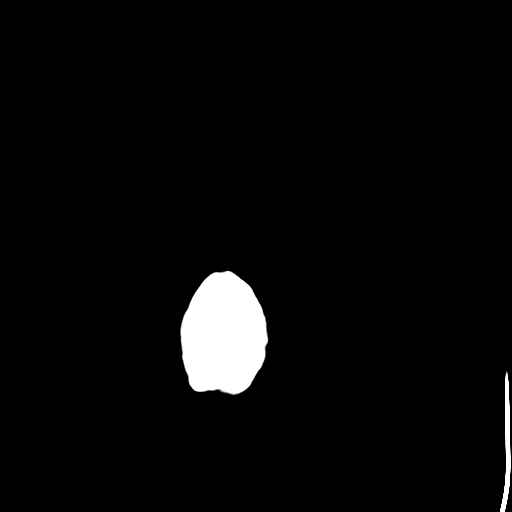

[Series 602: <mpr thick range> · coronal · 0.49mm/px · 3 of 106 slices shown]
[im 36/106  brain]
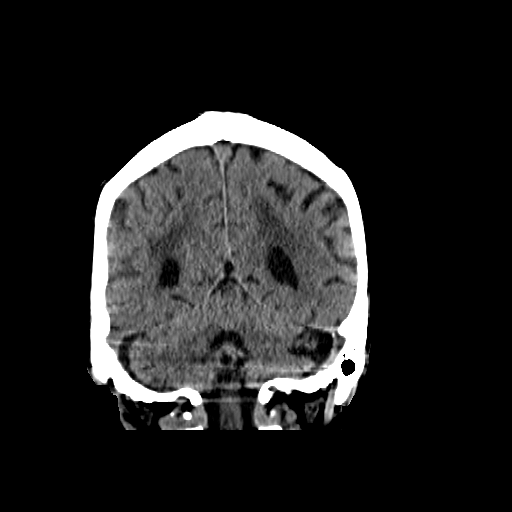
[im 47/106  brain]
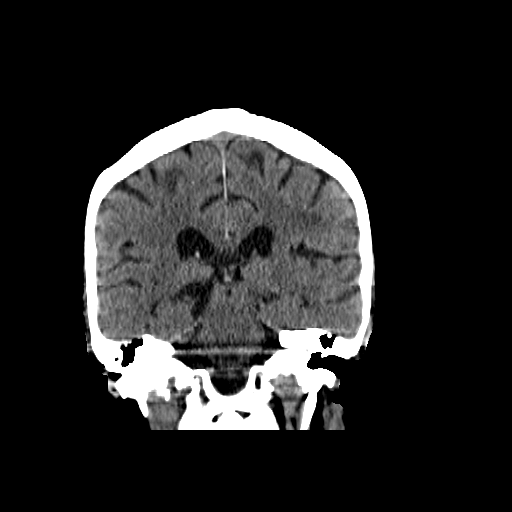
[im 59/106  brain]
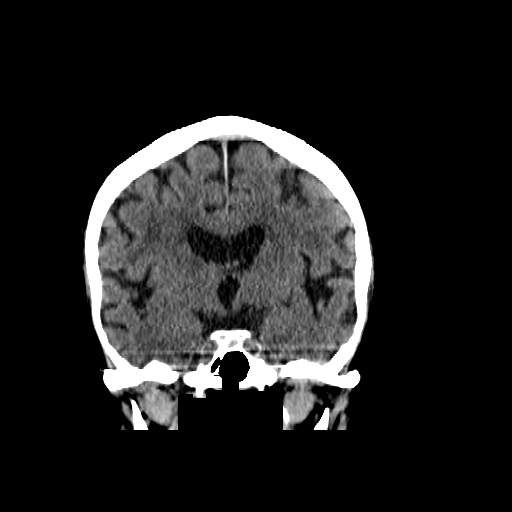

[Series 603: <mpr thick range(1)> · sagittal · 0.49mm/px · 3 of 78 slices shown]
[im 26/78  brain]
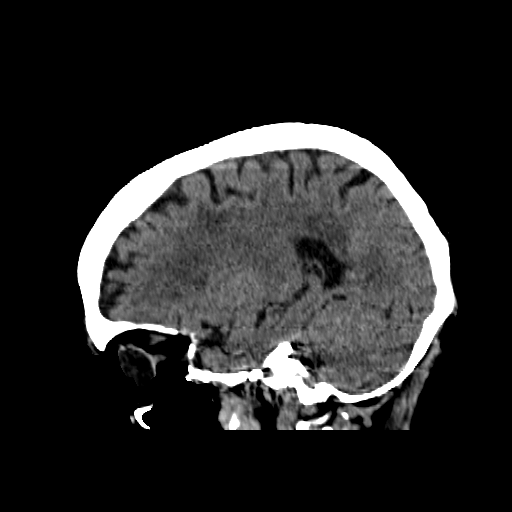
[im 39/78  brain]
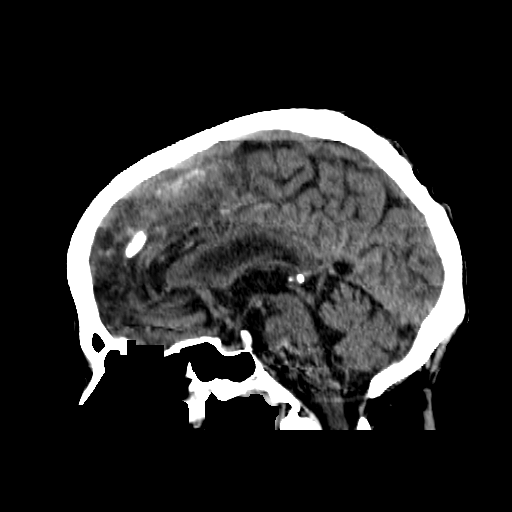
[im 52/78  brain]
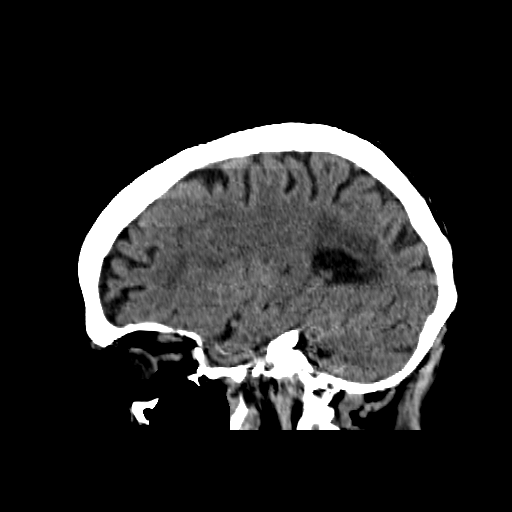

[17 of 47 positions shown; findings below may reference images not displayed]

FINDINGS: Paranasal sinuses, mastoid air cells, bones, and extracranial soft
tissues are normal. No subdural, epidural, or subarachnoid
hemorrhage. Basal cisterns are widely patent. Scattered white matter
changes, moderate in severity, are identified. No acute cortical
ischemia or infarct. No mass, mass effect, or midline shift. Mild
prominence of the ventricles and sulci is likely age related. No
acute intracranial abnormalities otherwise identified.
IMPRESSION: No acute abnormalities
